# Patient Record
Sex: Female | Born: 1970 | Race: Asian | Hispanic: No | Smoking: Never smoker
Health system: Southern US, Community
[De-identification: ages and names within clinical notes are randomized; demographics above are authoritative.]

## PROBLEM LIST (undated history)

## (undated) DIAGNOSIS — F411 Generalized anxiety disorder: Secondary | ICD-10-CM

## (undated) DIAGNOSIS — M1990 Unspecified osteoarthritis, unspecified site: Secondary | ICD-10-CM

## (undated) DIAGNOSIS — R5383 Other fatigue: Secondary | ICD-10-CM

## (undated) DIAGNOSIS — G4700 Insomnia, unspecified: Secondary | ICD-10-CM

## (undated) DIAGNOSIS — R1032 Left lower quadrant pain: Secondary | ICD-10-CM

## (undated) DIAGNOSIS — D649 Anemia, unspecified: Secondary | ICD-10-CM

## (undated) DIAGNOSIS — T7840XA Allergy, unspecified, initial encounter: Secondary | ICD-10-CM

## (undated) DIAGNOSIS — E079 Disorder of thyroid, unspecified: Secondary | ICD-10-CM

## (undated) DIAGNOSIS — K219 Gastro-esophageal reflux disease without esophagitis: Secondary | ICD-10-CM

## (undated) DIAGNOSIS — Z9889 Other specified postprocedural states: Secondary | ICD-10-CM

## (undated) DIAGNOSIS — R109 Unspecified abdominal pain: Secondary | ICD-10-CM

## (undated) DIAGNOSIS — R05 Cough: Secondary | ICD-10-CM

## (undated) DIAGNOSIS — J019 Acute sinusitis, unspecified: Secondary | ICD-10-CM

## (undated) DIAGNOSIS — R5381 Other malaise: Secondary | ICD-10-CM

## (undated) DIAGNOSIS — R51 Headache: Secondary | ICD-10-CM

## (undated) DIAGNOSIS — R059 Cough, unspecified: Secondary | ICD-10-CM

## (undated) DIAGNOSIS — E785 Hyperlipidemia, unspecified: Secondary | ICD-10-CM

## (undated) DIAGNOSIS — F329 Major depressive disorder, single episode, unspecified: Secondary | ICD-10-CM

## (undated) DIAGNOSIS — J309 Allergic rhinitis, unspecified: Secondary | ICD-10-CM

## (undated) HISTORY — DX: Hyperlipidemia, unspecified: E78.5

## (undated) HISTORY — DX: Allergy, unspecified, initial encounter: T78.40XA

## (undated) HISTORY — DX: Left lower quadrant pain: R10.32

## (undated) HISTORY — DX: Unspecified abdominal pain: R10.9

## (undated) HISTORY — DX: Acute sinusitis, unspecified: J01.90

## (undated) HISTORY — DX: Disorder of thyroid, unspecified: E07.9

## (undated) HISTORY — DX: Other fatigue: R53.83

## (undated) HISTORY — DX: Gastro-esophageal reflux disease without esophagitis: K21.9

## (undated) HISTORY — PX: COLONOSCOPY: SHX174

## (undated) HISTORY — PX: RHINOPLASTY: SUR1284

## (undated) HISTORY — PX: TUBAL LIGATION: SHX77

## (undated) HISTORY — DX: Other specified postprocedural states: Z98.890

## (undated) HISTORY — DX: Major depressive disorder, single episode, unspecified: F32.9

## (undated) HISTORY — PX: WISDOM TOOTH EXTRACTION: SHX21

## (undated) HISTORY — DX: Other malaise: R53.81

## (undated) HISTORY — DX: Unspecified osteoarthritis, unspecified site: M19.90

## (undated) HISTORY — DX: Generalized anxiety disorder: F41.1

## (undated) HISTORY — PX: POLYPECTOMY: SHX149

## (undated) HISTORY — DX: Allergic rhinitis, unspecified: J30.9

---

## 2000-07-02 ENCOUNTER — Other Ambulatory Visit: Admission: RE | Admit: 2000-07-02 | Discharge: 2000-07-02 | Payer: Self-pay | Admitting: Obstetrics and Gynecology

## 2002-08-17 HISTORY — PX: ABDOMINAL HYSTERECTOMY: SHX81

## 2004-09-13 ENCOUNTER — Emergency Department (HOSPITAL_COMMUNITY): Admission: EM | Admit: 2004-09-13 | Discharge: 2004-09-13 | Payer: Self-pay | Admitting: Emergency Medicine

## 2004-09-15 ENCOUNTER — Ambulatory Visit: Payer: Self-pay | Admitting: Internal Medicine

## 2004-09-16 ENCOUNTER — Ambulatory Visit (HOSPITAL_COMMUNITY): Admission: RE | Admit: 2004-09-16 | Discharge: 2004-09-16 | Payer: Self-pay | Admitting: Internal Medicine

## 2004-09-17 ENCOUNTER — Ambulatory Visit (HOSPITAL_COMMUNITY): Admission: RE | Admit: 2004-09-17 | Discharge: 2004-09-17 | Payer: Self-pay | Admitting: Orthopedic Surgery

## 2004-09-25 ENCOUNTER — Ambulatory Visit: Payer: Self-pay | Admitting: Internal Medicine

## 2004-11-27 ENCOUNTER — Ambulatory Visit: Payer: Self-pay | Admitting: Internal Medicine

## 2005-06-02 ENCOUNTER — Other Ambulatory Visit: Admission: RE | Admit: 2005-06-02 | Discharge: 2005-06-02 | Payer: Self-pay | Admitting: Obstetrics and Gynecology

## 2005-09-30 ENCOUNTER — Ambulatory Visit: Payer: Self-pay | Admitting: Internal Medicine

## 2005-11-25 ENCOUNTER — Ambulatory Visit: Payer: Self-pay | Admitting: Internal Medicine

## 2006-04-20 ENCOUNTER — Ambulatory Visit: Payer: Self-pay | Admitting: Internal Medicine

## 2006-07-22 ENCOUNTER — Ambulatory Visit: Payer: Self-pay | Admitting: Internal Medicine

## 2006-10-25 ENCOUNTER — Ambulatory Visit: Payer: Self-pay | Admitting: Internal Medicine

## 2007-07-20 ENCOUNTER — Telehealth (INDEPENDENT_AMBULATORY_CARE_PROVIDER_SITE_OTHER): Payer: Self-pay | Admitting: *Deleted

## 2007-08-04 ENCOUNTER — Ambulatory Visit: Payer: Self-pay | Admitting: Internal Medicine

## 2007-08-04 DIAGNOSIS — R5383 Other fatigue: Secondary | ICD-10-CM

## 2007-08-04 DIAGNOSIS — F329 Major depressive disorder, single episode, unspecified: Secondary | ICD-10-CM

## 2007-08-04 DIAGNOSIS — R109 Unspecified abdominal pain: Secondary | ICD-10-CM | POA: Insufficient documentation

## 2007-08-04 DIAGNOSIS — J309 Allergic rhinitis, unspecified: Secondary | ICD-10-CM

## 2007-08-04 DIAGNOSIS — K219 Gastro-esophageal reflux disease without esophagitis: Secondary | ICD-10-CM

## 2007-08-04 DIAGNOSIS — F411 Generalized anxiety disorder: Secondary | ICD-10-CM

## 2007-08-04 DIAGNOSIS — F3289 Other specified depressive episodes: Secondary | ICD-10-CM

## 2007-08-04 DIAGNOSIS — R5381 Other malaise: Secondary | ICD-10-CM

## 2007-08-04 DIAGNOSIS — N760 Acute vaginitis: Secondary | ICD-10-CM | POA: Insufficient documentation

## 2007-08-04 DIAGNOSIS — N390 Urinary tract infection, site not specified: Secondary | ICD-10-CM

## 2007-08-04 HISTORY — DX: Gastro-esophageal reflux disease without esophagitis: K21.9

## 2007-08-04 HISTORY — DX: Allergic rhinitis, unspecified: J30.9

## 2007-08-04 HISTORY — DX: Major depressive disorder, single episode, unspecified: F32.9

## 2007-08-04 HISTORY — DX: Other specified depressive episodes: F32.89

## 2007-08-04 HISTORY — DX: Unspecified abdominal pain: R10.9

## 2007-08-04 HISTORY — DX: Other malaise: R53.81

## 2007-08-04 HISTORY — DX: Generalized anxiety disorder: F41.1

## 2007-08-04 LAB — CONVERTED CEMR LAB
AST: 16 units/L (ref 0–37)
Bilirubin Urine: NEGATIVE
Bilirubin, Direct: 0.1 mg/dL (ref 0.0–0.3)
Eosinophils Absolute: 0.1 10*3/uL (ref 0.0–0.6)
Eosinophils Relative: 0.8 % (ref 0.0–5.0)
GFR calc Af Amer: 144 mL/min
GFR calc non Af Amer: 119 mL/min
Glucose, Bld: 96 mg/dL (ref 70–99)
HCT: 37.4 % (ref 36.0–46.0)
Ketones, urine, test strip: NEGATIVE
Lymphocytes Relative: 30.9 % (ref 12.0–46.0)
MCV: 90.2 fL (ref 78.0–100.0)
Monocytes Absolute: 0.5 10*3/uL (ref 0.2–0.7)
Neutro Abs: 4.3 10*3/uL (ref 1.4–7.7)
Neutrophils Relative %: 60.9 % (ref 43.0–77.0)
Nitrite: POSITIVE
Potassium: 4 meq/L (ref 3.5–5.1)
Protein, U semiquant: NEGATIVE
Sodium: 139 meq/L (ref 135–145)
TSH: 1.38 microintl units/mL (ref 0.35–5.50)
Urobilinogen, UA: NEGATIVE
WBC: 7.1 10*3/uL (ref 4.5–10.5)

## 2007-11-10 ENCOUNTER — Ambulatory Visit: Payer: Self-pay | Admitting: Internal Medicine

## 2007-11-10 DIAGNOSIS — R300 Dysuria: Secondary | ICD-10-CM

## 2008-03-26 ENCOUNTER — Ambulatory Visit: Payer: Self-pay | Admitting: Internal Medicine

## 2008-03-26 ENCOUNTER — Telehealth (INDEPENDENT_AMBULATORY_CARE_PROVIDER_SITE_OTHER): Payer: Self-pay | Admitting: *Deleted

## 2008-03-26 DIAGNOSIS — R1032 Left lower quadrant pain: Secondary | ICD-10-CM | POA: Insufficient documentation

## 2008-03-26 DIAGNOSIS — J069 Acute upper respiratory infection, unspecified: Secondary | ICD-10-CM | POA: Insufficient documentation

## 2008-03-26 HISTORY — DX: Left lower quadrant pain: R10.32

## 2008-04-11 ENCOUNTER — Encounter (INDEPENDENT_AMBULATORY_CARE_PROVIDER_SITE_OTHER): Payer: Self-pay | Admitting: Obstetrics and Gynecology

## 2008-04-11 ENCOUNTER — Inpatient Hospital Stay (HOSPITAL_COMMUNITY): Admission: RE | Admit: 2008-04-11 | Discharge: 2008-04-13 | Payer: Self-pay | Admitting: Obstetrics and Gynecology

## 2008-05-23 ENCOUNTER — Ambulatory Visit: Payer: Self-pay | Admitting: Internal Medicine

## 2008-08-03 ENCOUNTER — Telehealth (INDEPENDENT_AMBULATORY_CARE_PROVIDER_SITE_OTHER): Payer: Self-pay | Admitting: *Deleted

## 2010-07-25 ENCOUNTER — Ambulatory Visit: Payer: Self-pay | Admitting: Internal Medicine

## 2010-07-25 DIAGNOSIS — J019 Acute sinusitis, unspecified: Secondary | ICD-10-CM

## 2010-07-25 DIAGNOSIS — H6980 Other specified disorders of Eustachian tube, unspecified ear: Secondary | ICD-10-CM | POA: Insufficient documentation

## 2010-07-25 HISTORY — DX: Acute sinusitis, unspecified: J01.90

## 2010-09-16 NOTE — Assessment & Plan Note (Signed)
Summary: ?sinus inf/cd   Vital Signs:  Patient profile:   40 year old female Height:      67 inches Weight:      153.44 pounds BMI:     24.12 O2 Sat:      97 % on Room air Temp:     98.4 degrees F 97oral Pulse rate:   99 / minute BP sitting:   100 / 64  (left arm) Cuff size:   regular  Vitals Entered By: Zella Ball Ewing CMA Duncan Dull) (July 25, 2010 12:53 PM)  O2 Flow:  Room air CC: Congestion, fatigue, dizzy/RE   CC:  Congestion, fatigue, and dizzy/RE.  History of Present Illness: here with c/o acute onset midl to mod 3 days facial pain, pressure, fever and grenish d/c, assoc with marked fatigue , mild ear pressure bilat and dizziness.  Some small ST, but Pt denies CP, worsening sob, doe, wheezing, orthopnea, pnd, worsening LE edema, palps, dizziness or syncope Pt denies new neuro symptoms such as headache, facial or extremity weakness .  Eyes have been mild watery as well for several weeks prior to onset more acute symptoms with some nasal clearish congestion as well, no pain or fever.  No vertigo, hearing loss, n/v.  No overt bleeding or bruising, has regular usual menses, no OSA symtpoms and Denies worsening depressive symptoms, suicidal ideation, or panic.    Preventive Screening-Counseling & Management      Drug Use:  no.    Problems Prior to Update: 1)  Sinusitis- Acute-nos  (ICD-461.9) 2)  Uri  (ICD-465.9) 3)  Abdominal Pain, Left Lower Quadrant  (ICD-789.04) 4)  Dysuria  (ICD-788.1) 5)  Depression  (ICD-311) 6)  Anxiety  (ICD-300.00) 7)  Gerd  (ICD-530.81) 8)  Allergic Rhinitis  (ICD-477.9) 9)  Vaginitis  (ICD-616.10) 10)  Fatigue  (ICD-780.79) 11)  Abdominal Pain, Suprapubic  (ICD-789.09) 12)  Uti  (ICD-599.0)  Medications Prior to Update: 1)  Hydrocodone-Acetaminophen 5-325 Mg  Tabs (Hydrocodone-Acetaminophen) .Marland Kitchen.. 1 - 2 By Mouth Q 6 Hrs As Needed Pain 2)  Cephalexin 500 Mg  Tabs (Cephalexin) .Marland Kitchen.. 1 By Mouth Three Times A Day 3)  Fluconazole 150 Mg  Tabs  (Fluconazole) .Marland Kitchen.. 1 By Mouth Q 3 Days As Needed 4)  Tussionex Pennkinetic Er 8-10 Mg/53ml  Lqcr (Chlorpheniramine-Hydrocodone) .Marland Kitchen.. 1 Tsp By Mouth Two Times A Day As Needed  Current Medications (verified): 1)  Levofloxacin 500 Mg Tabs (Levofloxacin) .Marland Kitchen.. 1po Once Daily 2)  Fluconazole 150 Mg  Tabs (Fluconazole) .Marland Kitchen.. 1 By Mouth Q 3 Days As Needed For Yeast Infection 3)  Hydrocodone-Homatropine 5-1.5 Mg/14ml Syrp (Hydrocodone-Homatropine) .Marland Kitchen.. 1 Tsp By Mouth Q 6 Hrs As Needed Cough 4)  Sudahist 12-120 Mg Xr12h-Tab (Chlorpheniramine-Pseudoeph) .Marland Kitchen.. 1 By Mouth Two Times A Day As Needed  Allergies (verified): No Known Drug Allergies  Past History:  Past Medical History: Last updated: 08/04/2007 Allergic rhinitis GERD Anxiety Depression  Past Surgical History: Last updated: 08/04/2007 Denies surgical history  Social History: Last updated: 07/25/2010 Never Smoked Alcohol use-no Drug use-no Married  Risk Factors: Smoking Status: never (08/04/2007)  Social History: Never Smoked Alcohol use-no Drug use-no Married Drug Use:  no  Review of Systems       all otherwise negative per pt -    Physical Exam  General:  alert and well-developed.  , mild ill  Head:  normocephalic and atraumatic.   Eyes:  vision grossly intact, pupils equal, and pupils round.   Ears:  R ear normal. left  TM mild erythema, nonbulging and canals area clear, sinus tender bilat Nose:  nasal dischargemucosal pallor and mucosal edema.   Mouth:  pharyngeal erythema and fair dentition.   Neck:  supple and no masses.   Lungs:  normal respiratory effort and normal breath sounds.   Heart:  normal rate and regular rhythm.   Abdomen:  mod LLQ tender Msk:  no joint tenderness and no joint swelling.   Extremities:  no edema, no ulcers  Neurologic:  strength normal in all extremities and gait normal.     Impression & Recommendations:  Problem # 1:  SINUSITIS- ACUTE-NOS (ICD-461.9) Assessment  Deteriorated  Her updated medication list for this problem includes:    Levofloxacin 500 Mg Tabs (Levofloxacin) .Marland Kitchen... 1po once daily    Hydrocodone-homatropine 5-1.5 Mg/30ml Syrp (Hydrocodone-homatropine) .Marland Kitchen... 1 tsp by mouth q 6 hrs as needed cough    Sudahist 12-120 Mg Xr12h-tab (Chlorpheniramine-pseudoeph) .Marland Kitchen... 1 by mouth two times a day as needed treat as above, f/u any worsening signs or symptoms   Problem # 2:  FATIGUE (ICD-780.79) Assessment: Deteriorated exam o/w benign - exam benign, to check labs next visit,  follow with expectant management   Problem # 3:  ALLERGIC RHINITIS (ICD-477.9) Assessment: Deteriorated Ok for OTC allegra as needed   Problem # 4:  OTHER DISORDERS OF EUSTACHIAN TUBE (ICD-381.89) Assessment: Deteriorated likely to account for the dizziness - for mucinex otc as needed   Complete Medication List: 1)  Levofloxacin 500 Mg Tabs (Levofloxacin) .Marland Kitchen.. 1po once daily 2)  Fluconazole 150 Mg Tabs (Fluconazole) .Marland Kitchen.. 1 by mouth q 3 days as needed for yeast infection 3)  Hydrocodone-homatropine 5-1.5 Mg/57ml Syrp (Hydrocodone-homatropine) .Marland Kitchen.. 1 tsp by mouth q 6 hrs as needed cough 4)  Sudahist 12-120 Mg Xr12h-tab (Chlorpheniramine-pseudoeph) .Marland Kitchen.. 1 by mouth two times a day as needed  Patient Instructions: 1)  Please take all new medications as prescribed 2)  Continue all previous medications as before this visit  3)  Please schedule a follow-up appointment in 1 month for CPX with labs Prescriptions: SUDAHIST 12-120 MG XR12H-TAB (CHLORPHENIRAMINE-PSEUDOEPH) 1 by mouth two times a day as needed  #30 x 0   Entered and Authorized by:   Corwin Levins MD   Signed by:   Corwin Levins MD on 07/25/2010   Method used:   Print then Give to Patient   RxID:   [id number] LEVOFLOXACIN 500 MG TABS (LEVOFLOXACIN) 1po once daily  #10 x 0   Entered and Authorized by:   Corwin Levins MD   Signed by:   Corwin Levins MD on 07/25/2010   Method used:   Print then Give to Patient    RxID:   [id number] FLUCONAZOLE 150 MG  TABS (FLUCONAZOLE) 1 by mouth q 3 days as needed for yeast infection  #2 x 1   Entered and Authorized by:   Corwin Levins MD   Signed by:   Corwin Levins MD on 07/25/2010   Method used:   Print then Give to Patient   RxID:   [id number] HYDROCODONE-HOMATROPINE 5-1.5 MG/5ML SYRP (HYDROCODONE-HOMATROPINE) 1 tsp by mouth q 6 hrs as needed cough  #6oz x 1   Entered and Authorized by:   Corwin Levins MD   Signed by:   Corwin Levins MD on 07/25/2010   Method used:   Print then Give to Patient   RxID:   [id number]    Orders Added: 1)  Est. Patient Level IV [82993]

## 2010-11-13 ENCOUNTER — Other Ambulatory Visit: Payer: Self-pay

## 2010-11-13 ENCOUNTER — Other Ambulatory Visit: Payer: Self-pay | Admitting: Internal Medicine

## 2010-11-13 DIAGNOSIS — Z0000 Encounter for general adult medical examination without abnormal findings: Secondary | ICD-10-CM

## 2010-11-18 ENCOUNTER — Other Ambulatory Visit: Payer: Self-pay | Admitting: Obstetrics and Gynecology

## 2010-11-18 DIAGNOSIS — R928 Other abnormal and inconclusive findings on diagnostic imaging of breast: Secondary | ICD-10-CM

## 2010-11-19 ENCOUNTER — Other Ambulatory Visit (INDEPENDENT_AMBULATORY_CARE_PROVIDER_SITE_OTHER): Payer: Managed Care, Other (non HMO)

## 2010-11-19 DIAGNOSIS — Z0000 Encounter for general adult medical examination without abnormal findings: Secondary | ICD-10-CM

## 2010-11-19 LAB — LIPID PANEL
LDL Cholesterol: 129 mg/dL — ABNORMAL HIGH (ref 0–99)
Triglycerides: 32 mg/dL (ref 0.0–149.0)
VLDL: 6.4 mg/dL (ref 0.0–40.0)

## 2010-11-19 LAB — BASIC METABOLIC PANEL
CO2: 29 mEq/L (ref 19–32)
Chloride: 103 mEq/L (ref 96–112)
Creatinine, Ser: 0.5 mg/dL (ref 0.4–1.2)
Sodium: 139 mEq/L (ref 135–145)

## 2010-11-19 LAB — CBC WITH DIFFERENTIAL/PLATELET
Basophils Absolute: 0 10*3/uL (ref 0.0–0.1)
Hemoglobin: 12.8 g/dL (ref 12.0–15.0)
Lymphocytes Relative: 28.5 % (ref 12.0–46.0)
Lymphs Abs: 1.5 10*3/uL (ref 0.7–4.0)
MCHC: 34.8 g/dL (ref 30.0–36.0)
Monocytes Relative: 5.5 % (ref 3.0–12.0)
Neutro Abs: 3.4 10*3/uL (ref 1.4–7.7)
Neutrophils Relative %: 64.7 % (ref 43.0–77.0)
RDW: 13.1 % (ref 11.5–14.6)
WBC: 5.3 10*3/uL (ref 4.5–10.5)

## 2010-11-19 LAB — HEPATIC FUNCTION PANEL
ALT: 15 U/L (ref 0–35)
AST: 24 U/L (ref 0–37)
Alkaline Phosphatase: 43 U/L (ref 39–117)
Bilirubin, Direct: 0.1 mg/dL (ref 0.0–0.3)
Total Bilirubin: 1 mg/dL (ref 0.3–1.2)
Total Protein: 7.1 g/dL (ref 6.0–8.3)

## 2010-11-19 LAB — URINALYSIS
Bilirubin Urine: NEGATIVE
Ketones, ur: NEGATIVE
Urine Glucose: NEGATIVE
Urobilinogen, UA: 0.2 (ref 0.0–1.0)
pH: 6 (ref 5.0–8.0)

## 2010-11-20 ENCOUNTER — Encounter: Payer: Self-pay | Admitting: Internal Medicine

## 2010-11-20 ENCOUNTER — Ambulatory Visit
Admission: RE | Admit: 2010-11-20 | Discharge: 2010-11-20 | Disposition: A | Discharge status: Home / Self Care | Payer: Managed Care, Other (non HMO) | Source: Ambulatory Visit | Attending: Obstetrics and Gynecology | Admitting: Obstetrics and Gynecology

## 2010-11-20 ENCOUNTER — Other Ambulatory Visit: Payer: Self-pay | Admitting: Diagnostic Radiology

## 2010-11-20 ENCOUNTER — Other Ambulatory Visit: Payer: Self-pay | Admitting: Obstetrics and Gynecology

## 2010-11-20 ENCOUNTER — Ambulatory Visit (INDEPENDENT_AMBULATORY_CARE_PROVIDER_SITE_OTHER)
Admission: RE | Admit: 2010-11-20 | Discharge: 2010-11-20 | Disposition: A | Discharge status: Home / Self Care | Payer: Managed Care, Other (non HMO) | Source: Ambulatory Visit | Attending: Internal Medicine | Admitting: Internal Medicine

## 2010-11-20 ENCOUNTER — Ambulatory Visit (INDEPENDENT_AMBULATORY_CARE_PROVIDER_SITE_OTHER): Payer: Managed Care, Other (non HMO) | Admitting: Internal Medicine

## 2010-11-20 VITALS — BP 92/60 | HR 76 | Temp 97.4°F | Ht 67.0 in | Wt 155.2 lb

## 2010-11-20 DIAGNOSIS — R928 Other abnormal and inconclusive findings on diagnostic imaging of breast: Secondary | ICD-10-CM

## 2010-11-20 DIAGNOSIS — Z0000 Encounter for general adult medical examination without abnormal findings: Secondary | ICD-10-CM | POA: Insufficient documentation

## 2010-11-20 DIAGNOSIS — F329 Major depressive disorder, single episode, unspecified: Secondary | ICD-10-CM

## 2010-11-20 DIAGNOSIS — R079 Chest pain, unspecified: Secondary | ICD-10-CM | POA: Insufficient documentation

## 2010-11-20 DIAGNOSIS — J309 Allergic rhinitis, unspecified: Secondary | ICD-10-CM

## 2010-11-20 DIAGNOSIS — K219 Gastro-esophageal reflux disease without esophagitis: Secondary | ICD-10-CM

## 2010-11-20 DIAGNOSIS — G4700 Insomnia, unspecified: Secondary | ICD-10-CM

## 2010-11-20 DIAGNOSIS — Z0001 Encounter for general adult medical examination with abnormal findings: Secondary | ICD-10-CM | POA: Insufficient documentation

## 2010-11-20 MED ORDER — ZOLPIDEM TARTRATE 10 MG PO TABS: 10.0000 mg | ORAL_TABLET | Freq: Every evening | ORAL | Status: DC | PRN

## 2010-11-20 MED ORDER — FLUTICASONE PROPIONATE 50 MCG/ACT NA SUSP: 2.0000 | Freq: Every day | NASAL | Status: DC

## 2010-11-20 MED ORDER — ESCITALOPRAM OXALATE 10 MG PO TABS: 10.0000 mg | ORAL_TABLET | Freq: Every day | ORAL | Status: DC

## 2010-11-20 MED ORDER — OMEPRAZOLE 20 MG PO CPDR: 20.0000 mg | DELAYED_RELEASE_CAPSULE | Freq: Every day | ORAL | Status: DC

## 2010-11-20 NOTE — Progress Notes (Signed)
Subjective:    Patient ID: [name], female    DOB: 1971-01-10, 40 y.o.   MRN: [id number]  HPI  Here for wellness and f/u;  Overall doing ok;  Pt denies worsening SOB, DOE, wheezing, orthopnea, PND, worsening LE edema, palpitations, dizziness or syncope, but has infreqeunt mid and left side CP, sharp, somewhat affects the left arm, somwhat exertional, tries to exercise at the gym lately which has slowed her efforts down, pain better with rest at the gym when occurs, occasionally worse to take deep breaths but not always, and some diaphoresis occasionally as well but is trying to excercies, has no assoc nausea/vomit, palp's, dizziness or syncope. Did have fall about 7 yrs ago to the left side and was told at that time in a few yrs she might have worsening pain to the left arm and shoulder that might require further eval or even surgury.  Pt denies neurological change such as new Headache, facial or extremity weakness.  Pt denies polydipsia, polyuria, or low sugar symptoms. Pt states overall good compliance with treatment and medications, good tolerability, and trying to follow lower cholesterol diet.  Pt denies worsening depressive symptoms, suicidal ideation or panic. No fever, wt loss, night sweats, loss of appetite, or other constitutional symptoms.  Pt states good ability with ADL's, low fall risk, home safety reviewed and adequate, no significant changes in hearing or vision, and occasionally active with exercise.   Has mammogram later today, in f/u for ? Abnormal exam last wk.  Denies worsening depressive symptoms, suicidal ideation, or panic, though has ongoing anxiety, not increased recently.  Has ongoing insomnia as well, worse to try to get to sleep, and stay asleep.  Works 12 hrs days to pay for college savings for child. Also with 3 wks increased nasal allergy symtpoms with congestion and OTC meds not working.  Also with daily reflux without  dysphagia, abd pain, n/v, bowel change or blood.   Past  Medical History  Diagnosis Date  . ANXIETY 08/04/2007  . DEPRESSION 08/04/2007  . Other disorders of Eustachian tube 07/25/2010  . SINUSITIS- ACUTE-NOS 07/25/2010  . URI 03/26/2008  . ALLERGIC RHINITIS 08/04/2007  . GERD 08/04/2007  . UTI 08/04/2007  . VAGINITIS 08/04/2007  . FATIGUE 08/04/2007  . Dysuria 11/10/2007  . Abdominal pain, left lower quadrant 03/26/2008  . ABDOMINAL PAIN, SUPRAPUBIC 08/04/2007   No past surgical history on file.  reports that she has never smoked. She does not have any smokeless tobacco history on file. She reports that she does not drink alcohol or use illicit drugs. family history includes Asthma in her mother. No Known Allergies  Current Outpatient Prescriptions on File Prior to Visit  Medication Sig Dispense Refill  . Chlorpheniramine-Pseudoeph (SUDAHIST) 12-120 MG TB12 Take by mouth 2 (two) times daily.        . fluconazole (DIFLUCAN) 150 MG tablet Take 150 mg by mouth. 1 by mouth every 3 days as needed for yeast infection       . HYDROcodone-homatropine (HYCODAN) 5-1.5 MG/5ML syrup Take by mouth every 6 (six) hours as needed.        Marland Kitchen levofloxacin (LEVAQUIN) 500 MG tablet Take 500 mg by mouth daily.         Review of Systems Review of Systems  Constitutional: Negative for diaphoresis, activity change, appetite change and unexpected weight change.  HENT: Negative for hearing loss, ear pain, facial swelling, mouth sores and neck stiffness.   Eyes: Negative for pain, redness and  visual disturbance.  Respiratory: Negative for shortness of breath and wheezing.   Cardiovascular: Negative for chest pain and palpitations.  Gastrointestinal: Negative for diarrhea, blood in stool, abdominal distention and rectal pain.  Genitourinary: Negative for hematuria, flank pain and decreased urine volume.  Musculoskeletal: Negative for myalgias and joint swelling.  Skin: Negative for color change and wound.  Neurological: Negative for syncope and numbness.    Hematological: Negative for adenopathy.  Psychiatric/Behavioral: Negative for hallucinations, self-injury, decreased concentration and agitation. but has had ongoing stress and depressive symtpoms for several months without suicidal ideation or panic.     Objective:   Physical Exam BP 92/60  Pulse 76  Temp(Src) 97.4 F (36.3 C) (Oral)  Ht 5\' 7"  (1.702 m)  Wt 155 lb 4 oz (70.421 kg)  BMI 24.32 kg/m2  SpO2 98%  Physical Exam  VS noted Constitutional: Pt is oriented to person, place, and time. Appears well-developed and well-nourished.  HENT:  Head: Normocephalic and atraumatic.  Right Ear: External ear normal.  Left Ear: External ear normal.  Nose: Nose normal.  Mouth/Throat: Oropharynx is clear and moist.  Eyes: Conjunctivae and EOM are normal. Pupils are equal, round, and reactive to light.  Neck: Normal range of motion. Neck supple. No JVD present. No tracheal deviation present.  Cardiovascular: Normal rate, regular rhythm, normal heart sounds and intact distal pulses.   Pulmonary/Chest: Effort normal and breath sounds normal.  Abdominal: Soft. Bowel sounds are normal. There is no tenderness.  Musculoskeletal: Normal range of motion. Exhibits no edema.  Lymphadenopathy:  Has no cervical adenopathy.  Neurological: Pt is alert and oriented to person, place, and time. Pt has normal reflexes. No cranial nerve deficit. Motor normal Skin: Skin is warm and dry. No rash noted.  Psychiatric:  Has  dysphoric mood and affect. Behavior is normal.         Assessment & Plan:

## 2010-11-20 NOTE — Assessment & Plan Note (Signed)
Mild to mod, likely stress related, for ambien prn, try to avoid benzos for now

## 2010-11-20 NOTE — Assessment & Plan Note (Signed)
Mild to mod, treatment as below, f/u any worsening symptoms

## 2010-11-20 NOTE — Assessment & Plan Note (Signed)

## 2010-11-20 NOTE — Patient Instructions (Signed)
Take all new medications as prescribed - the sleep med (generic ambien), omeprazole (generic prilosec), generic lexapro (for depression and nerves), and generic flonase for the allergies Your EKG and blood work was ok Please follow lower cholesterol diet Please go to XRAY in the Basement for the x-ray test Please call the number on the [employer] ([facility] [employer]) for results of testing in 2-3 days Please call if you change your mind about having the stress test done Please return in 1 year for your yearly visit, or sooner if needed, with Lab testing done 3-5 days before

## 2010-11-20 NOTE — Assessment & Plan Note (Signed)
Mild to mod ongoing dysphoria - treatment as below, f/u any worsening symptoms

## 2010-11-20 NOTE — Assessment & Plan Note (Signed)
Mild to mod, treatment as below, f/u any worsening symptoms  

## 2010-11-20 NOTE — Assessment & Plan Note (Signed)
Atypical, ECG reviewed - no acute changes, also for CXR;  I strongly advised stress test and holding off on gym exercise but she declines at this time

## 2010-11-27 ENCOUNTER — Encounter: Payer: Self-pay | Admitting: Internal Medicine

## 2010-12-30 NOTE — H&P (Signed)
[name], [name]                    ACCOUNT NO.:  [id number]   MEDICAL RECORD NO.:  [id number]         PATIENT TYPE:  WAMB   LOCATION:                                FACILITY:  [facility]   PHYSICIAN:  Duke Salvia. Marcelle Overlie, M.D.DATE OF BIRTH:  1971-05-26   DATE OF ADMISSION:  04/11/2008  DATE OF DISCHARGE:                              HISTORY & PHYSICAL   CHIEF COMPLAINT:  Symptomatic leiomyoma.   HISTORY OF PRESENT ILLNESS:  A 40 year old G6, P4 who has had a prior  tubal.  When seen in May 2009 was complaining of pelvic and lower back  discomfort and feeling a mass in her lower abdomen.  Exam at that time  revealed the uterus to be 12-week size.  Pelvic ultrasound was done in  our office on 01/27/2008 that showed 2 large fibroids 8.2 x 7.4 and 4.1  cm adnexa unremarkable.  Due to continued problems with dysmenorrhea,  pelvic, and lower back pain, she presents now for definitive  hysterectomy.  Procedure of TAH discussed with her including risks  related to bleeding, infection, transfusion, adjacent organ injury along  with expected recovery time, which she understands and accepts.   PAST MEDICAL HISTORY/ALLERGIES:  None.   OPERATIONS:  Tubal ligation.   She has 4 living children.   No current medications.   Last Pap in May 2009 was normal.   FAMILY HISTORY:  Otherwise unremarkable.   PHYSICAL EXAM:  VITAL SIGNS:  Temperature 98.2 and blood pressure  120/78.  HEENT:  Unremarkable.  NECK:  Supple without masses.  LUNGS:  Clear.  CARDIOVASCULAR:  Regular rate and rhythm without murmurs, rubs, or  gallops.  BREASTS:  Without masses.  ABDOMEN:  Soft, flat, nontender.  GU:  Fibroids are palpable above the symphysis.  Vulva, vagina, and  cervix normal.  Uterus 12 to 14-week size, irregular.  Adnexa negative.  EXTREMITIES AND NEUROLOGIC:  Unremarkable.   IMPRESSION:  Symptomatic leiomyoma.   PLAN:  TAH procedure and risks reviewed as above.      Richard M. Marcelle Overlie, M.D.  Electronically Signed     RMH/MEDQ  D:  04/03/2008  T:  04/04/2008  Job:  [id number]

## 2010-12-30 NOTE — Op Note (Signed)
[name], Underwood                    ACCOUNT NO.:  [id number]   MEDICAL RECORD NO.:  [id number]          PATIENT TYPE:  AMB   LOCATION:  [facility]                           FACILITY:  [facility]   PHYSICIAN:  Duke Salvia. Marcelle Overlie, M.D.DATE OF BIRTH:  01/23/1971   DATE OF PROCEDURE:  04/11/2008  DATE OF DISCHARGE:                               OPERATIVE REPORT   PREOPERATIVE DIAGNOSIS:  Symptomatic leiomyoma.   POSTOPERATIVE DIAGNOSIS:  Symptomatic leiomyoma.   PROCEDURE:  TAH.   SURGEON:  Duke Salvia. Marcelle Overlie, MD   ASSISTANT:  Ottie Glazier   ANESTHESIA:  General endotracheal.   COMPLICATIONS:  None.   DRAINS:  Foley catheter.   URINE OUTPUT:  300 mL.   EBL:  300 mL.   COMPLICATIONS:  None.   SPECIMENS REMOVED:  Uterus and cervix.   PROCEDURE AND FINDINGS:  The patient was taken to the operating room.  After an adequate level of general endotracheal anesthesia was obtained,  the patient was laid supine.  The abdomen was prepped and drape in usual  manner for sterile abdominal procedure.  Vagina was prepped separately  with Betadine.  Foley catheter was positioned draining clear urine.  Transverse incision made 2 fingerbreadths above the symphysis after  normal prepping and draping.  This was carried down to the fascia which  was incised and extended transversely.  Rectus muscles divided in the  midline.  Peritoneum entered superiorly without incident and extended  vertically.  O'Connor-O'Sullivan retractor was positioned.  Green drape  pads were then used to elevate the retractor to make sure there was no  sidewall pressure.  She was placed in Trendelenburg.  Bowels packed  superiorly out of the field.  The uterus itself was 15 weeks size with  several large fibroids, adnexa unremarkable.  The single-tooth tenaculum  was used to grasp the large fibroid and elevate the uterus out of the  pelvis.  Long Kelly clamps were then placed across each utero-ovarian  pedicle.  Starting on the  right, the handheld LigaSure was then used to  coagulate and divide the round ligament.  The peritoneum was carried  around the midline anteriorly.  The posterior ligament and broad-  ligament was then perforated with the surgeon's finger and the right  utero-ovarian pedicle was isolated, clamped, divided first free tied  followed by suture ligature 0 Vicryl.  The exact same repeated on the  opposite side, thus both ovaries were conserved.   The uterine vascular pedicle on either side was then skeletonized,  bladder was dissected below the cervicovaginal margin with sharp and  blunt dissection.  Then in sequential manner, the ascending branch of  the uterine artery, cardinal ligament, and uterine and uterosacral  ligaments were coagulated and divided.  Curved Heaney clamps was then  used to take the final pedicle at the cervicovaginal junction.  This was  sutured in Heaney fashion with 0 Vicryl suture and the specimen was  excised.  Cuff was closed with interrupted 2-0 Monocryl sutures.  The  pelvis was irrigated with saline, aspirated noted be hemostatic.  The  ovaries were suspended to the round ligament on each side with a single  stay suture for elevation.  Prior to closure sponge, needle, and  instrument counts reported as correct x2.  Peritoneum closed with  running 2-0 Vicryl suture.  Rectus muscles reapproximated in the midline  with 2-0 Vicryl interrupted sutures.  Fascia closed from laterally to  midline on either side with 0 PDS suture.  Subcutaneous tissue was  hemostatic.  On-Q subfascial and subcutaneous catheter were positioned.  Clips were used on the skin.  She tolerated this well, went to recovery  room in good condition.  Clear urine noted at the end of the case.      Richard M. Marcelle Overlie, M.D.  Electronically Signed     RMH/MEDQ  D:  04/11/2008  T:  04/11/2008  Job:  [id number]

## 2010-12-30 NOTE — Discharge Summary (Signed)
NAMECRESSIE, BETZLER                    ACCOUNT NO.:  [id number]   MEDICAL RECORD NO.:  [id number]          PATIENT TYPE:  INP   LOCATION:  9319                          FACILITY:  [facility]   PHYSICIAN:  Duke Salvia. Marcelle Overlie, M.D.DATE OF BIRTH:  08-25-1970   DATE OF ADMISSION:  04/11/2008  DATE OF DISCHARGE:  04/13/2008                               DISCHARGE SUMMARY   DISCHARGE DIAGNOSES:  1. Symptomatic leiomyoma.  2. Total abdominal hysterectomy this admission.   Summary of the history and physical exam, please see admission H and P  for details.  Briefly, a 40 year old with symptomatic fibroids, presents  for definitive hysterectomy.   HOSPITAL COURSE:  On April 11, 2008, under general anesthesia, the  patient underwent TAH, both ovaries were conserved.  The uterus was  approximately 15-week size, approximately 750 g.  On first postop day,  her hemoglobin was 10.2, catheter was removed.  Her diet was advanced.  By the a.m. of April 13, 2008, she remained afebrile.  She was  ambulating without difficulty.  Voiding well.  Incision was clean and  dry with a normal abdominal exam, and she was ready for discharge at  that point.   LABORATORY DATA:  CBC on April 10, 2008, WBC 5.4, hemoglobin 13.2,  hematocrit 39.1, platelets 260,000.  EPT negative.  Antibody screen was  negative.  Blood type is B positive.  Postop CBC on April 12, 2008, WBC  7.1, hemoglobin 10.2, and hematocrit 30.1.   DISPOSITION:  The patient is discharged on Tylox p.r.n. pain.  Will  return to the office in 3-4 days to have her clips removed.  Advised to  report any incisional redness or drainage, increased pain or bleeding,  or fever over 101.  She was given specific instructions regarding diet,  sex, and exercise.   CONDITION:  Good.   ACTIVITY:  Graded increase.      Richard M. Marcelle Overlie, M.D.  Electronically Signed     RMH/MEDQ  D:  04/13/2008  T:  04/13/2008  Job:  [id number]

## 2011-03-20 ENCOUNTER — Ambulatory Visit (INDEPENDENT_AMBULATORY_CARE_PROVIDER_SITE_OTHER): Payer: Managed Care, Other (non HMO) | Admitting: Internal Medicine

## 2011-03-20 ENCOUNTER — Encounter: Payer: Self-pay | Admitting: Internal Medicine

## 2011-03-20 VITALS — BP 120/80 | HR 68 | Temp 98.6°F | Ht 67.0 in | Wt 162.0 lb

## 2011-03-20 DIAGNOSIS — R5381 Other malaise: Secondary | ICD-10-CM

## 2011-03-20 DIAGNOSIS — F411 Generalized anxiety disorder: Secondary | ICD-10-CM

## 2011-03-20 DIAGNOSIS — J209 Acute bronchitis, unspecified: Secondary | ICD-10-CM

## 2011-03-20 DIAGNOSIS — H6980 Other specified disorders of Eustachian tube, unspecified ear: Secondary | ICD-10-CM

## 2011-03-20 DIAGNOSIS — R5383 Other fatigue: Secondary | ICD-10-CM | POA: Insufficient documentation

## 2011-03-20 MED ORDER — HYDROCODONE-HOMATROPINE 5-1.5 MG/5ML PO SYRP: 5.0000 mL | ORAL_SOLUTION | Freq: Four times a day (QID) | ORAL | Status: DC | PRN

## 2011-03-20 MED ORDER — AZITHROMYCIN 250 MG PO TABS: ORAL_TABLET | ORAL | Status: AC

## 2011-03-20 NOTE — Assessment & Plan Note (Signed)
Also for mucinex otc prn,  to f/u any worsening symptoms or concerns  

## 2011-03-20 NOTE — Progress Notes (Signed)
Subjective:    Patient ID: [name], female    DOB: 06/20/1971, 40 y.o.   MRN: [id number]  HPI Here with acute onset mild to mod 2-3 days ST, HA, general weakness and malaise, with prod cough greenish sputum, but Pt denies chest pain, increased sob or doe, wheezing, orthopnea, PND, increased LE swelling, palpitations, or syncope, with mild dizziness, and ear popping and crackling.  Does have sense of ongoing fatigue, but denies signficant hypersomnolence.  Pt denies new neurological symptoms such as new headache, or facial or extremity weakness or numbness   Pt denies polydipsia, polyuria. Denies worsening depressive symptoms, suicidal ideation, or panic, though has ongoing anxiety, not increased recently.   Past Medical History  Diagnosis Date  . ANXIETY 08/04/2007  . DEPRESSION 08/04/2007  . Other disorders of Eustachian tube 07/25/2010  . SINUSITIS- ACUTE-NOS 07/25/2010  . URI 03/26/2008  . ALLERGIC RHINITIS 08/04/2007  . GERD 08/04/2007  . UTI 08/04/2007  . VAGINITIS 08/04/2007  . FATIGUE 08/04/2007  . Dysuria 11/10/2007  . Abdominal pain, left lower quadrant 03/26/2008  . ABDOMINAL PAIN, SUPRAPUBIC 08/04/2007   No past surgical history on file.  reports that she has never smoked. She does not have any smokeless tobacco history on file. She reports that she does not drink alcohol or use illicit drugs. family history includes Asthma in her mother. No Known Allergies Current Outpatient Prescriptions on File Prior to Visit  Medication Sig Dispense Refill  . escitalopram (LEXAPRO) 10 MG tablet Take 1 tablet (10 mg total) by mouth daily.  30 tablet  11  . fluconazole (DIFLUCAN) 150 MG tablet Take 150 mg by mouth. 1 by mouth every 3 days as needed for yeast infection       . fluticasone (FLONASE) 50 MCG/ACT nasal spray 2 sprays by Nasal route daily.  16 g  2  . omeprazole (PRILOSEC) 20 MG capsule Take 1 capsule (20 mg total) by mouth daily.  30 capsule  11  . Chlorpheniramine-Pseudoeph  (SUDAHIST) 12-120 MG TB12 Take by mouth 2 (two) times daily.        Marland Kitchen HYDROcodone-homatropine (HYCODAN) 5-1.5 MG/5ML syrup Take by mouth every 6 (six) hours as needed.         .    Review of Systems Review of Systems  Constitutional: Negative for diaphoresis and unexpected weight change.  HENT: Negative for drooling and tinnitus.   Eyes: Negative for photophobia and visual disturbance.  Respiratory: Negative for choking and stridor.   Gastrointestinal: Negative for vomiting and blood in stool.  Genitourinary: Negative for hematuria and decreased urine volume.       Objective:   Physical Exam BP 120/80  Pulse 68  Temp(Src) 98.6 F (37 C) (Oral)  Ht 5\' 7"  (1.702 m)  Wt 162 lb (73.483 kg)  BMI 25.37 kg/m2  SpO2 97% Physical Exam  VS noted, mild ill Constitutional: Pt appears well-developed and well-nourished.  HENT: Head: Normocephalic.  Right Ear: External ear normal.  Left Ear: External ear normal.  Bilat tm's mild erythema.  Sinus nontender.  Pharynx mild erythema Eyes: Conjunctivae and EOM are normal. Pupils are equal, round, and reactive to light.  Neck: Normal range of motion. Neck supple.  Cardiovascular: Normal rate and regular rhythm.   Pulmonary/Chest: Effort normal and breath sounds normal.  Neurological: Pt is alert. No cranial nerve deficit.  Skin: Skin is warm. No erythema.  Psychiatric: Pt behavior is normal. Thought content normal. 1+ nervous  Assessment & Plan:

## 2011-03-20 NOTE — Assessment & Plan Note (Signed)
Mild to mod, for antibx course,  to f/u any worsening symptoms or concerns 

## 2011-03-20 NOTE — Assessment & Plan Note (Signed)
Etiology unclear, Exam otherwise benign, most likely secondary to above, follow with expectant management

## 2011-03-20 NOTE — Assessment & Plan Note (Signed)
stable overall by hx and exam, most recent data reviewed with pt, and pt to continue medical treatment as before  Lab Results  Component Value Date   WBC 5.3 11/19/2010   HGB 12.8 11/19/2010   HCT 36.6 11/19/2010   PLT 224.0 11/19/2010   CHOL 183 11/19/2010   TRIG 32.0 11/19/2010   HDL 48.10 11/19/2010   ALT 15 11/19/2010   AST 24 11/19/2010   NA 139 11/19/2010   K 4.0 11/19/2010   CL 103 11/19/2010   CREATININE 0.5 11/19/2010   BUN 8 11/19/2010   CO2 29 11/19/2010   TSH 0.64 11/19/2010

## 2011-03-20 NOTE — Patient Instructions (Addendum)
Take all new medications as prescribed Continue all other medications as before Please return approximately Apr 5 , 2013 with labs done 3-4 days before

## 2011-06-22 ENCOUNTER — Ambulatory Visit (INDEPENDENT_AMBULATORY_CARE_PROVIDER_SITE_OTHER): Payer: Managed Care, Other (non HMO) | Admitting: Internal Medicine

## 2011-06-22 ENCOUNTER — Other Ambulatory Visit (INDEPENDENT_AMBULATORY_CARE_PROVIDER_SITE_OTHER): Payer: Managed Care, Other (non HMO)

## 2011-06-22 VITALS — BP 110/80 | HR 80 | Temp 97.8°F | Wt 163.0 lb

## 2011-06-22 DIAGNOSIS — L299 Pruritus, unspecified: Secondary | ICD-10-CM

## 2011-06-22 LAB — HEPATIC FUNCTION PANEL
Albumin: 4 g/dL (ref 3.5–5.2)
Total Protein: 7.3 g/dL (ref 6.0–8.3)

## 2011-06-22 LAB — CBC WITH DIFFERENTIAL/PLATELET
Basophils Absolute: 0 10*3/uL (ref 0.0–0.1)
Eosinophils Absolute: 0.1 10*3/uL (ref 0.0–0.7)
HCT: 39.6 % (ref 36.0–46.0)
Hemoglobin: 13.3 g/dL (ref 12.0–15.0)
Lymphs Abs: 1.4 10*3/uL (ref 0.7–4.0)
MCHC: 33.5 g/dL (ref 30.0–36.0)
MCV: 91.7 fl (ref 78.0–100.0)
Monocytes Absolute: 0.2 10*3/uL (ref 0.1–1.0)
Neutro Abs: 2.7 10*3/uL (ref 1.4–7.7)
RDW: 13.1 % (ref 11.5–14.6)

## 2011-06-22 NOTE — Patient Instructions (Signed)
Generalized itching - no visible rash and there is no abnormal finding on exam. Will check liver, thyroid and blood counts to look for a potential cause. To relieve the symptoms take loratadine 10 mg twice a day; take ranitidine 150 mg twice a day. Soothing treatments for the skin include neutral moisturizing lotions; tepid bath with colloidal suspension - oatmeal, avino.    Pruritus   Pruritis is an itch. There are many different problems that can cause an itch. Dry skin is one of the most common causes of itching. Most cases of itching do not require medical attention.   HOME CARE INSTRUCTIONS   Make sure your skin is moistened on a regular basis. A moisturizer that contains petroleum jelly is best for keeping moisture in your skin. If you develop a rash, you may try the following for relief:    Use corticosteroid cream.     Apply cool compresses to the affected areas.     Bathe with Epsom salts or baking soda in the bathwater.     Soak in colloidal oatmeal baths. These are available at your pharmacy.     Apply baking soda paste to the rash. Stir water into baking soda until it reaches a paste-like consistency.     Use an anti-itch lotion.     Take over-the-counter diphenhydramine medicine by mouth as the instructions direct.     Avoid scratching. Scratching may cause the rash to become infected. If itching is very bad, your caregiver may suggest prescription lotions or creams to lessen your symptoms.     Avoid hot showers, which can make itching worse. A cold shower may help with itching as long as you use a moisturizer after the shower.  SEEK MEDICAL CARE IF: The itching does not go away after several days. Document Released: 04/15/2011 Document Reviewed: 01/05/2011 [facility] Patient Information 2012 [place], [place].

## 2011-06-23 NOTE — Progress Notes (Signed)
Subjective:    Patient ID: [name], female    DOB: May 02, 1971, 40 y.o.   MRN: [id number]  HPI [name] presents with a c/o generalized pruritus. She will excoriate herself in trying to relieve her discomfort. She has no new contact allergen: bedding, detergents, body lotions or cosmetics. She has no h/o liver disease and has not been jaundiced, had abdominal pain, dry skin, alteration in temperature regulation. Her symptoms have present for 1 week +.  Past Medical History  Diagnosis Date  . ANXIETY 08/04/2007  . DEPRESSION 08/04/2007  . Other disorders of Eustachian tube 07/25/2010  . SINUSITIS- ACUTE-NOS 07/25/2010  . URI 03/26/2008  . ALLERGIC RHINITIS 08/04/2007  . GERD 08/04/2007  . UTI 08/04/2007  . VAGINITIS 08/04/2007  . FATIGUE 08/04/2007  . Dysuria 11/10/2007  . Abdominal pain, left lower quadrant 03/26/2008  . ABDOMINAL PAIN, SUPRAPUBIC 08/04/2007   No past surgical history on file. Family History  Problem Relation Age of Onset  . Asthma Mother    History   Social History  . Marital Status: Married    Spouse Name: N/A    Number of Children: N/A  . Years of Education: N/A   Occupational History  . Not on file.   Social History Main Topics  . Smoking status: Never Smoker   . Smokeless tobacco: Not on file  . Alcohol Use: No  . Drug Use: No  . Sexually Active: Not on file         Review of Systems Constitutional:  Negative for fever, chills, activity change and unexpected weight change.  HEENT:  Negative for hearing loss, ear pain, congestion, neck stiffness and postnasal drip. Negative for sore throat or swallowing problems. Negative for dental complaints.   Eyes: Negative for vision loss or change in visual acuity.  Respiratory: Negative for chest tightness and wheezing. Negative for DOE.   Cardiovascular: Negative for chest pain or palpitations. No decreased exercise tolerance Gastrointestinal: No change in bowel habit. No bloating or gas. No reflux or  indigestion Genitourinary: Negative for urgency, frequency, flank pain and difficulty urinating.  Musculoskeletal: Negative for myalgias, back pain, arthralgias and gait problem.  Neurological: Negative for dizziness, tremors, weakness and headaches.  Hematological: Negative for adenopathy.  Psychiatric/Behavioral: Negative for behavioral problems and dysphoric mood.      Objective:   Physical Exam Vitals reviewed and stable Gen'l - WNWD asian woman in no acute distress HEENT - C&S clear w/o icterus, PERRLA Neck- no thyromegaly Cor- RRR Pulm - normal respirations Abdomen- no guarding or rebound, no hepatomegaly, no tenderness Derm - old excoriations back, arms. No lesions, no rash.  Lab Results  Component Value Date   WBC 4.5 06/22/2011   HGB 13.3 06/22/2011   HCT 39.6 06/22/2011   PLT 241.0 06/22/2011   GLUCOSE 82 11/19/2010   CHOL 183 11/19/2010   TRIG 32.0 11/19/2010   HDL 48.10 11/19/2010   LDLCALC 129* 11/19/2010   ALT 17 06/22/2011   AST 18 06/22/2011   NA 139 11/19/2010   K 4.0 11/19/2010   CL 103 11/19/2010   CREATININE 0.5 11/19/2010   BUN 8 11/19/2010   CO2 29 11/19/2010   TSH 0.71 06/22/2011          Assessment & Plan:  Pruritus - patient with genralized itching. All lab is normal: no liver abnormality, normal thyroid, normal CBC with normal differential.  Plan - symptomatic treatment: loratadine 10 mg bid, ranitidine 150 mg bid, tepid soaks.  For persistent symptoms will check for occult hepatitis (unlikely w/ normal LFTs)

## 2011-06-27 ENCOUNTER — Other Ambulatory Visit: Payer: Self-pay | Admitting: Internal Medicine

## 2011-06-27 ENCOUNTER — Encounter: Payer: Self-pay | Admitting: Internal Medicine

## 2011-06-27 ENCOUNTER — Ambulatory Visit (INDEPENDENT_AMBULATORY_CARE_PROVIDER_SITE_OTHER): Payer: Managed Care, Other (non HMO) | Admitting: Internal Medicine

## 2011-06-27 ENCOUNTER — Encounter: Payer: Self-pay | Admitting: *Deleted

## 2011-06-27 VITALS — BP 102/64 | HR 77 | Temp 97.9°F | Ht 67.0 in | Wt 168.0 lb

## 2011-06-27 DIAGNOSIS — T7840XA Allergy, unspecified, initial encounter: Secondary | ICD-10-CM

## 2011-06-27 MED ORDER — METHYLPREDNISOLONE ACETATE 80 MG/ML IJ SUSP
80.0000 mg | Freq: Once | INTRAMUSCULAR | Status: AC
  Administered 2011-06-27: 80 mg via INTRAMUSCULAR

## 2011-06-27 MED ORDER — PREDNISONE 20 MG PO TABS: 40.0000 mg | ORAL_TABLET | Freq: Every day | ORAL | Status: AC

## 2011-06-27 MED ORDER — HYDROXYZINE HCL 25 MG PO TABS: 25.0000 mg | ORAL_TABLET | Freq: Three times a day (TID) | ORAL | Status: DC | PRN

## 2011-06-27 NOTE — Assessment & Plan Note (Signed)
No evidence of systemic disease per Dr Debby Bud' eval Will treat with steroids and for itch May need eval by allergist if persists

## 2011-06-27 NOTE — Progress Notes (Signed)
  Subjective:    Patient ID: [name], female    DOB: 06-Jan-1971, 40 y.o.   MRN: [id number]  HPI Itching has worsened Gets burning sensation Keeps her up at night  Tried the loratadine and zantac without effect  Gets intermittent on face and hands No environmental exposures  Dr Debby Bud' note reviewed  Current Outpatient Prescriptions on File Prior to Visit  Medication Sig Dispense Refill  . Chlorpheniramine-Pseudoeph (SUDAHIST) 12-120 MG TB12 Take by mouth 2 (two) times daily.        Marland Kitchen escitalopram (LEXAPRO) 10 MG tablet Take 1 tablet (10 mg total) by mouth daily.  30 tablet  11  . fluticasone (FLONASE) 50 MCG/ACT nasal spray 2 sprays by Nasal route daily.  16 g  2  . HYDROcodone-homatropine (HYCODAN) 5-1.5 MG/5ML syrup Take by mouth every 6 (six) hours as needed.        Marland Kitchen omeprazole (PRILOSEC) 20 MG capsule Take 1 capsule (20 mg total) by mouth daily.  30 capsule  11  . fluconazole (DIFLUCAN) 150 MG tablet Take 150 mg by mouth. 1 by mouth every 3 days as needed for yeast infection         No Known Allergies  Past Medical History  Diagnosis Date  . ANXIETY 08/04/2007  . DEPRESSION 08/04/2007  . Other disorders of Eustachian tube 07/25/2010  . SINUSITIS- ACUTE-NOS 07/25/2010  . URI 03/26/2008  . ALLERGIC RHINITIS 08/04/2007  . GERD 08/04/2007  . UTI 08/04/2007  . VAGINITIS 08/04/2007  . FATIGUE 08/04/2007  . Dysuria 11/10/2007  . Abdominal pain, left lower quadrant 03/26/2008  . ABDOMINAL PAIN, SUPRAPUBIC 08/04/2007    No past surgical history on file.  Family History  Problem Relation Age of Onset  . Asthma Mother     History   Social History  . Marital Status: Married    Spouse Name: N/A    Number of Children: N/A  . Years of Education: N/A   Occupational History  . Not on file.   Social History Main Topics  . Smoking status: Never Smoker   . Smokeless tobacco: Not on file  . Alcohol Use: No  . Drug Use: No  . Sexually Active: Not on file   Other Topics  Concern  . Not on file   Social History Narrative  . No narrative on file    Review of Systems No nausea or vomiting Able to eat okay    Objective:   Physical Exam  Constitutional: She appears well-developed and well-nourished.       Uncomfortable with itching  HENT:  Mouth/Throat: Oropharynx is clear and moist. No oropharyngeal exudate.  Neck: Normal range of motion.  Pulmonary/Chest: Effort normal and breath sounds normal. No respiratory distress. She has no wheezes. She has no rales.  Abdominal: Soft. There is no tenderness.  Lymphadenopathy:    She has no cervical adenopathy.  Skin:       Various red areas on ears and hands          Assessment & Plan:

## 2011-06-29 ENCOUNTER — Other Ambulatory Visit: Payer: Self-pay

## 2011-06-29 MED ORDER — ZOLPIDEM TARTRATE 10 MG PO TABS: 10.0000 mg | ORAL_TABLET | Freq: Every evening | ORAL | Status: DC | PRN

## 2011-06-29 NOTE — Telephone Encounter (Signed)
Faxed hardcopy to [address] and [place] at [phone]

## 2011-06-29 NOTE — Telephone Encounter (Signed)
Prescription faxed

## 2011-07-05 ENCOUNTER — Encounter: Payer: Self-pay | Admitting: Internal Medicine

## 2011-08-18 HISTORY — PX: LASER ABLATION: SHX1947

## 2011-08-24 ENCOUNTER — Telehealth: Payer: Self-pay | Admitting: Internal Medicine

## 2011-08-24 ENCOUNTER — Ambulatory Visit: Payer: Managed Care, Other (non HMO) | Admitting: Internal Medicine

## 2011-08-24 ENCOUNTER — Ambulatory Visit (INDEPENDENT_AMBULATORY_CARE_PROVIDER_SITE_OTHER): Payer: Managed Care, Other (non HMO) | Admitting: Internal Medicine

## 2011-08-24 ENCOUNTER — Encounter: Payer: Self-pay | Admitting: Internal Medicine

## 2011-08-24 VITALS — BP 102/62 | HR 68 | Temp 98.2°F | Ht 67.0 in | Wt 167.0 lb

## 2011-08-24 DIAGNOSIS — H6980 Other specified disorders of Eustachian tube, unspecified ear: Secondary | ICD-10-CM

## 2011-08-24 DIAGNOSIS — F329 Major depressive disorder, single episode, unspecified: Secondary | ICD-10-CM

## 2011-08-24 DIAGNOSIS — J019 Acute sinusitis, unspecified: Secondary | ICD-10-CM

## 2011-08-24 MED ORDER — HYDROCODONE-HOMATROPINE 5-1.5 MG/5ML PO SYRP: 5.0000 mL | ORAL_SOLUTION | Freq: Four times a day (QID) | ORAL | Status: AC | PRN

## 2011-08-24 MED ORDER — LEVOFLOXACIN 250 MG PO TABS: 250.0000 mg | ORAL_TABLET | Freq: Every day | ORAL | Status: DC

## 2011-08-24 MED ORDER — LEVOFLOXACIN 250 MG PO TABS: 250.0000 mg | ORAL_TABLET | Freq: Every day | ORAL | Status: AC

## 2011-08-24 NOTE — Patient Instructions (Addendum)
Take all new medications as prescribed Continue all other medications as before  

## 2011-08-24 NOTE — Assessment & Plan Note (Signed)
Mild to mod, for antibx course,  to f/u any worsening symptoms or concerns 

## 2011-08-24 NOTE — Telephone Encounter (Signed)
Called left message that prescription was sent in again and informed of the pharmacy as well.

## 2011-08-24 NOTE — Assessment & Plan Note (Signed)
stable overall by hx and exam, most recent data reviewed with pt, and pt to continue medical treatment as before  Lab Results  Component Value Date   WBC 4.5 06/22/2011   HGB 13.3 06/22/2011   HCT 39.6 06/22/2011   PLT 241.0 06/22/2011   GLUCOSE 82 11/19/2010   CHOL 183 11/19/2010   TRIG 32.0 11/19/2010   HDL 48.10 11/19/2010   LDLCALC 129* 11/19/2010   ALT 17 06/22/2011   AST 18 06/22/2011   NA 139 11/19/2010   K 4.0 11/19/2010   CL 103 11/19/2010   CREATININE 0.5 11/19/2010   BUN 8 11/19/2010   CO2 29 11/19/2010   TSH 0.71 06/22/2011

## 2011-08-24 NOTE — Telephone Encounter (Signed)
Per pt pharmacy did not receive prescription for sinus infection--Pharm--[facility] [address]

## 2011-08-24 NOTE — Assessment & Plan Note (Signed)
Also for mucinex otc prn,  to f/u any worsening symptoms or concerns  

## 2011-08-24 NOTE — Telephone Encounter (Signed)
Done again per emr, but please note the phamacy to which the med was sent

## 2011-08-24 NOTE — Progress Notes (Signed)
  Subjective:    Patient ID: [name], female    DOB: 1970-10-06, 41 y.o.   MRN: [id number]  HPI   Here with 3 days acute onset fever, facial pain, pressure, general weakness and malaise, and greenish d/c, with slight ST, but little to no cough and Pt denies chest pain, increased sob or doe, wheezing, orthopnea, PND, increased LE swelling, palpitations, dizziness or syncope.  Denies worsening depressive symptoms, suicidal ideation, or panic, though has ongoing anxiety, not increased recently.   Also with bilat ear popping, crackling, without hearing loss, vertigo, n/v.  Past Medical History  Diagnosis Date  . ANXIETY 08/04/2007  . DEPRESSION 08/04/2007  . Other disorders of Eustachian tube 07/25/2010  . SINUSITIS- ACUTE-NOS 07/25/2010  . URI 03/26/2008  . ALLERGIC RHINITIS 08/04/2007  . GERD 08/04/2007  . UTI 08/04/2007  . VAGINITIS 08/04/2007  . FATIGUE 08/04/2007  . Dysuria 11/10/2007  . Abdominal pain, left lower quadrant 03/26/2008  . ABDOMINAL PAIN, SUPRAPUBIC 08/04/2007   No past surgical history on file.  reports that she has never smoked. She does not have any smokeless tobacco history on file. She reports that she does not drink alcohol or use illicit drugs. family history includes Asthma in her mother. No Known Allergies Current Outpatient Prescriptions on File Prior to Visit  Medication Sig Dispense Refill  . escitalopram (LEXAPRO) 10 MG tablet Take 1 tablet (10 mg total) by mouth daily.  30 tablet  11  . omeprazole (PRILOSEC) 20 MG capsule Take 1 capsule (20 mg total) by mouth daily.  30 capsule  11  . zolpidem (AMBIEN) 10 MG tablet TAKE 1 TABLET BY MOUTH AT BEDTIME AS NEEDED FOR SLEEP  30 tablet  5  . Chlorpheniramine-Pseudoeph (SUDAHIST) 12-120 MG TB12 Take by mouth 2 (two) times daily.         Review of Systems Review of Systems  Constitutional: Negative for diaphoresis and unexpected weight change.  HENT: Negative for drooling and tinnitus.   Eyes: Negative for  photophobia and visual disturbance.  Respiratory: Negative for choking and stridor.   Gastrointestinal: Negative for vomiting and blood in stool.  Genitourinary: Negative for hematuria and decreased urine volume.  Objective:   Physical Exam BP 102/62  Pulse 68  Temp(Src) 98.2 F (36.8 C) (Oral)  Ht 5\' 7"  (1.702 m)  Wt 167 lb (75.751 kg)  BMI 26.16 kg/m2  SpO2 98% Physical Exam  VS noted, mild ill Constitutional: Pt appears well-developed and well-nourished.  HENT: Head: Normocephalic.  Right Ear: External ear normal.  Left Ear: External ear normal.  Bilat tm's mild erythema.  Sinus tender bilat.  Pharynx mild erythema Eyes: Conjunctivae and EOM are normal. Pupils are equal, round, and reactive to light.  Neck: Normal range of motion. Neck supple.  Cardiovascular: Normal rate and regular rhythm.   Pulmonary/Chest: Effort normal and breath sounds normal.  Neurological: Pt is alert. No cranial nerve deficit.  Skin: Skin is warm. No erythema.  Psychiatric: Pt behavior is normal. Thought content normal. 1+ nervous, not depressed affect    Assessment & Plan:

## 2011-11-19 ENCOUNTER — Ambulatory Visit (INDEPENDENT_AMBULATORY_CARE_PROVIDER_SITE_OTHER): Payer: Managed Care, Other (non HMO) | Admitting: Internal Medicine

## 2011-11-19 ENCOUNTER — Telehealth: Payer: Self-pay

## 2011-11-19 ENCOUNTER — Encounter: Payer: Self-pay | Admitting: Internal Medicine

## 2011-11-19 VITALS — BP 100/70 | HR 79 | Temp 98.3°F | Ht 67.0 in | Wt 171.2 lb

## 2011-11-19 DIAGNOSIS — J069 Acute upper respiratory infection, unspecified: Secondary | ICD-10-CM

## 2011-11-19 DIAGNOSIS — K120 Recurrent oral aphthae: Secondary | ICD-10-CM

## 2011-11-19 DIAGNOSIS — R42 Dizziness and giddiness: Secondary | ICD-10-CM

## 2011-11-19 DIAGNOSIS — Z0000 Encounter for general adult medical examination without abnormal findings: Secondary | ICD-10-CM

## 2011-11-19 DIAGNOSIS — M549 Dorsalgia, unspecified: Secondary | ICD-10-CM

## 2011-11-19 MED ORDER — TRIAMCINOLONE ACETONIDE 0.1 % MT PSTE: PASTE | Freq: Two times a day (BID) | OROMUCOSAL | Status: DC

## 2011-11-19 MED ORDER — HYDROXYZINE HCL 25 MG PO TABS: 25.0000 mg | ORAL_TABLET | Freq: Four times a day (QID) | ORAL | Status: DC | PRN

## 2011-11-19 MED ORDER — AZITHROMYCIN 250 MG PO TABS: ORAL_TABLET | ORAL | Status: AC

## 2011-11-19 MED ORDER — MECLIZINE HCL 12.5 MG PO TABS: 12.5000 mg | ORAL_TABLET | Freq: Three times a day (TID) | ORAL | Status: DC | PRN

## 2011-11-19 MED ORDER — MECLIZINE HCL 12.5 MG PO TABS: 12.5000 mg | ORAL_TABLET | Freq: Three times a day (TID) | ORAL | Status: AC | PRN

## 2011-11-19 MED ORDER — AZITHROMYCIN 250 MG PO TABS: ORAL_TABLET | ORAL | Status: DC

## 2011-11-19 NOTE — Patient Instructions (Addendum)
Take all new medications as prescribed Continue all other medications as before You can also take Delsym OTC for cough, and/or Mucinex (or it's generic off brand) for congestion Please return in 3 mo with Lab testing done 3-5 days before  

## 2011-11-19 NOTE — Telephone Encounter (Signed)
Prescriptions sent in to correct pharmacy.

## 2011-11-22 ENCOUNTER — Encounter: Payer: Self-pay | Admitting: Internal Medicine

## 2011-11-22 DIAGNOSIS — M549 Dorsalgia, unspecified: Secondary | ICD-10-CM | POA: Insufficient documentation

## 2011-11-22 DIAGNOSIS — K120 Recurrent oral aphthae: Secondary | ICD-10-CM | POA: Insufficient documentation

## 2011-11-22 DIAGNOSIS — R42 Dizziness and giddiness: Secondary | ICD-10-CM | POA: Insufficient documentation

## 2011-11-22 DIAGNOSIS — J069 Acute upper respiratory infection, unspecified: Secondary | ICD-10-CM | POA: Insufficient documentation

## 2011-11-22 NOTE — Assessment & Plan Note (Signed)
For kenalog in orabase -  to f/u any worsening symptoms or concerns

## 2011-11-22 NOTE — Assessment & Plan Note (Signed)
Mild, likely due to acute illness, inner ear related most likely, for meclizine prn

## 2011-11-22 NOTE — Assessment & Plan Note (Signed)
C/w mild Msk strain most likely,  to f/u any worsening symptoms or concerns, tylenol prn

## 2011-11-22 NOTE — Assessment & Plan Note (Signed)
Mild to mod, for antibx course,  to f/u any worsening symptoms or concerns 

## 2011-11-22 NOTE — Progress Notes (Signed)
  Subjective:    Patient ID: [name], female    DOB: [birth year], 41 y.o.   MRN: [id number]  HPI   Here with 3 days acute onset fever, facial and left ear discomfort and pressure, general weakness and malaise, and greenish d/c, with slight ST, but little to no cough and Pt denies chest pain, increased sob or doe, wheezing, orthopnea, PND, increased LE swelling, palpitations, dizziness or syncope, except for onset mild vertigo yesterday, as well as left lower inner lip small tender ulceration such as she has had prior as well.  Also incidentally with right LBP with some radiation to the right groin/rlq for 8 hrs, mild, worse to walk, nothing makes better and no LE pain/weak/numb, gait change or fall.  Denies worsening depressive symptoms, suicidal ideation, or panic, though has ongoing anxiety Past Medical History  Diagnosis Date  . ANXIETY 08/04/2007  . DEPRESSION 08/04/2007  . Other disorders of Eustachian tube 07/25/2010  . SINUSITIS- ACUTE-NOS 07/25/2010  . URI 03/26/2008  . ALLERGIC RHINITIS 08/04/2007  . GERD 08/04/2007  . UTI 08/04/2007  . VAGINITIS 08/04/2007  . FATIGUE 08/04/2007  . Dysuria 11/10/2007  . Abdominal pain, left lower quadrant 03/26/2008  . ABDOMINAL PAIN, SUPRAPUBIC 08/04/2007   No past surgical history on file.  reports that she has never smoked. She does not have any smokeless tobacco history on file. She reports that she does not drink alcohol or use illicit drugs. family history includes Asthma in her mother. No Known Allergies Current Outpatient Prescriptions on File Prior to Visit  Medication Sig Dispense Refill  . escitalopram (LEXAPRO) 10 MG tablet Take 1 tablet (10 mg total) by mouth daily.  30 tablet  11  . omeprazole (PRILOSEC) 20 MG capsule Take 1 capsule (20 mg total) by mouth daily.  30 capsule  11  . zolpidem (AMBIEN) 10 MG tablet TAKE 1 TABLET BY MOUTH AT BEDTIME AS NEEDED FOR SLEEP  30 tablet  5   Review of Systems Review of Systems  Constitutional:  Negative for diaphoresis and unexpected weight change.  HENT: Negative for drooling and tinnitus.   Eyes: Negative for photophobia and visual disturbance.  Respiratory: Negative for choking and stridor.   Gastrointestinal: Negative for vomiting and blood in stool.  Genitourinary: Negative for hematuria and decreased urine volume.  Musculoskeletal: Negative for gait problem.  Skin: Negative for color change and wound.     Objective:   Physical Exam Blood pressure 100/70, pulse 79, temperature 98.3 F (36.8 C), temperature source Oral, height 5\' 7"  (1.702 m), weight 171 lb 4 oz (77.678 kg), SpO2 97.00%. Physical Exam  VS noted, mild ill Constitutional: Pt appears well-developed and well-nourished.  HENT: Head: Normocephalic.  Right Ear: External ear normal.  Left Ear: External ear normal.  Bilat tm's mild erythema, left > right.  Sinus nontender.  Pharynx mild erythema Eyes: Conjunctivae and EOM are normal. Pupils are equal, round, and reactive to light.  Neck: Normal range of motion. Neck supple.  Cardiovascular: Normal rate and regular rhythm.   Pulmonary/Chest: Effort normal and breath sounds normal.  Abd:  Soft, NT, non-distended, + BS Spine nontender, mild right left paravertebral spasm noted Neurological: Pt is alert. No cranial nerve deficit. motor/dtr intact to LE's Skin: Skin is warm. No erythema. No rash   Assessment & Plan:

## 2011-12-25 ENCOUNTER — Other Ambulatory Visit (HOSPITAL_COMMUNITY): Payer: Self-pay | Admitting: Obstetrics and Gynecology

## 2011-12-25 DIAGNOSIS — R109 Unspecified abdominal pain: Secondary | ICD-10-CM

## 2011-12-29 ENCOUNTER — Ambulatory Visit (HOSPITAL_COMMUNITY): Payer: Managed Care, Other (non HMO)

## 2012-01-08 ENCOUNTER — Ambulatory Visit (HOSPITAL_COMMUNITY)
Admission: RE | Admit: 2012-01-08 | Discharge: 2012-01-08 | Disposition: A | Discharge status: Home / Self Care | Payer: Managed Care, Other (non HMO) | Source: Ambulatory Visit | Attending: Obstetrics and Gynecology | Admitting: Obstetrics and Gynecology

## 2012-01-08 DIAGNOSIS — R109 Unspecified abdominal pain: Secondary | ICD-10-CM

## 2012-01-08 DIAGNOSIS — R1012 Left upper quadrant pain: Secondary | ICD-10-CM | POA: Insufficient documentation

## 2012-01-20 ENCOUNTER — Other Ambulatory Visit: Payer: Self-pay | Admitting: Obstetrics and Gynecology

## 2012-02-15 ENCOUNTER — Ambulatory Visit: Payer: Managed Care, Other (non HMO) | Admitting: Internal Medicine

## 2012-03-14 NOTE — H&P (Signed)
[name]  DICTATION # [id number] CSN# [id number]   Meriel Pica, MD 03/14/2012 2:14 PM

## 2012-03-15 ENCOUNTER — Encounter (HOSPITAL_COMMUNITY): Payer: Self-pay

## 2012-03-15 ENCOUNTER — Encounter (HOSPITAL_COMMUNITY)
Admission: RE | Admit: 2012-03-15 | Discharge: 2012-03-15 | Disposition: A | Discharge status: Home / Self Care | Payer: Managed Care, Other (non HMO) | Source: Ambulatory Visit | Attending: Obstetrics and Gynecology | Admitting: Obstetrics and Gynecology

## 2012-03-15 HISTORY — DX: Insomnia, unspecified: G47.00

## 2012-03-15 LAB — CBC
MCHC: 32.6 g/dL (ref 30.0–36.0)
MCV: 91.5 fL (ref 78.0–100.0)
Platelets: 226 10*3/uL (ref 150–400)
RDW: 13.1 % (ref 11.5–15.5)
WBC: 5.3 10*3/uL (ref 4.0–10.5)

## 2012-03-15 LAB — SURGICAL PCR SCREEN: Staphylococcus aureus: NEGATIVE

## 2012-03-15 NOTE — Patient Instructions (Addendum)
   Your procedure is scheduled on: Wednesday, 03/23/12  Enter through the Main Entrance of [facility] at: 6 am Pick up the phone at the desk and dial [phone] and inform us of your arrival.  Please call this number if you have any problems the morning of surgery: [phone]  Remember: Do not eat food after midnight: Tuesday Do not drink clear liquids after: Tuesday Take these medicines the morning of surgery with a SIP OF WATER: prilosec  Do not wear jewelry, make-up, or FINGER nail polish No metal in your hair or on your body. Do not wear lotions, powders, perfumes or deodorant. Do not shave 48 hours prior to surgery. Do not bring valuables to the hospital. Contacts, dentures or bridgework may not be worn into surgery.  Patients discharged on the day of surgery will not be allowed to drive home.  Patient to arrange for a ride home, instructed that she can not drive for 24 hours after surgery.  Patient verbalized understanding.   Remember to use your hibiclens as instructed.Please shower with 1/2 bottle the evening before your surgery and the other 1/2 bottle the morning of surgery. Neck down avoiding private area.

## 2012-03-15 NOTE — H&P (Signed)
NAMEALLYSE, FREGEAU                    ACCOUNT NO.:  [id number]  MEDICAL RECORD NO.:  [id number]  LOCATION:                                 FACILITY:  PHYSICIAN:  Duke Salvia. Marcelle Overlie, M.D.DATE OF BIRTH:  Jan 05, 1971  DATE OF ADMISSION: DATE OF DISCHARGE:                             HISTORY & PHYSICAL   CHIEF COMPLAINT:  Pelvic pain, VAIN 1.  HPI:  A 41 year old, G6, P4.  This patient underwent TAH for fibroids in 2009.  Ovaries were normal at that time.  Over the last 6-12 months, she has experienced worsening left lower quadrant pain and has also been followed for abnormal vaginal cuff Pap smear that showed persistent mild dysplasia.  She subsequently underwent colposcopy with biopsy on 01/20/2012 that showed VAIN 1 with koilocytic atypia.  She presented at this time for laser ablation of VAIN 1 and diagnostic laparoscopy to rule out adnexal adhesions.  This procedure including risks related to bleeding, infection, adjacent organ injury, the possible need for open additional surgery discussed along with the need for careful followup cytology reviewed with her.  PAST MEDICAL HISTORY:  None.  ALLERGIES:  None.  CURRENT MEDICATIONS:  Ambien p.r.n.  OPERATIVE HISTORY:  She had TAH in 2009.  She had a breast needle biopsy 2012 that was benign.  SOCIAL HISTORY:  Denies alcohol, tobacco, or drug use.  She is married.  FAMILY HISTORY:  Otherwise unremarkable.  PHYSICAL EXAMINATION:  VITAL SIGNS:  Temp 98.2, blood pressure 130/82. HEENT:  Unremarkable. NECK:  Supple without masses. LUNGS:  Clear. CARDIOVASCULAR:  Regular rate and rhythm without murmurs, rubs, gallops. BREASTS:  Without masses. ABDOMEN:  Soft, flat, nontender. PELVIC:  Normal external genitalia.  Vaginal cuff is clear.  Bimanual revealed tenderness on the left.  No masses or nodularity noted.  NOTE:  Pelvic ultrasound 01/08/2012 showed negative abdominal pelvic ultrasound.  IMPRESSION: 1. Vaginal intraepithelial  neoplasia 1. 2. Pelvic pain, rule out adnexal adhesions.  PLAN:  Diagnostic laparoscopy with CO2 laser ablation of vaginal cuff dysplasia, mild.  Procedure and risks discussed as above.     Rockney Grenz M. Marcelle Overlie, M.D.     [facility]/MEDQ  D:  03/14/2012  T:  03/15/2012  Job:  [id number]

## 2012-03-19 ENCOUNTER — Encounter (HOSPITAL_COMMUNITY): Payer: Self-pay | Admitting: Pharmacist

## 2012-03-22 NOTE — Progress Notes (Signed)
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  Reviewed subcuticular closeure and injection of dilute Kenalog to reduce scarring. Remainder of procedure>>DL, laser abl of VAIN I remains the same

## 2012-03-23 ENCOUNTER — Encounter (HOSPITAL_COMMUNITY): Payer: Self-pay | Admitting: Anesthesiology

## 2012-03-23 ENCOUNTER — Encounter (HOSPITAL_COMMUNITY)
Admission: RE | Disposition: A | Discharge status: Home / Self Care | Payer: Self-pay | Source: Ambulatory Visit | Attending: Obstetrics and Gynecology

## 2012-03-23 ENCOUNTER — Ambulatory Visit (HOSPITAL_COMMUNITY)
Admission: RE | Admit: 2012-03-23 | Discharge: 2012-03-23 | Disposition: A | Discharge status: Home / Self Care | Payer: Managed Care, Other (non HMO) | Source: Ambulatory Visit | Attending: Obstetrics and Gynecology | Admitting: Obstetrics and Gynecology

## 2012-03-23 ENCOUNTER — Ambulatory Visit (HOSPITAL_COMMUNITY): Payer: Managed Care, Other (non HMO) | Admitting: Anesthesiology

## 2012-03-23 DIAGNOSIS — N893 Dysplasia of vagina, unspecified: Secondary | ICD-10-CM | POA: Insufficient documentation

## 2012-03-23 DIAGNOSIS — L910 Hypertrophic scar: Secondary | ICD-10-CM | POA: Insufficient documentation

## 2012-03-23 DIAGNOSIS — N949 Unspecified condition associated with female genital organs and menstrual cycle: Secondary | ICD-10-CM | POA: Insufficient documentation

## 2012-03-23 HISTORY — PX: LAPAROSCOPY: SHX197

## 2012-03-23 HISTORY — PX: SCAR REVISION: SHX5285

## 2012-03-23 SURGERY — LAPAROSCOPY OPERATIVE
Anesthesia: General | Site: Vagina | Wound class: Clean Contaminated

## 2012-03-23 MED ORDER — BUPIVACAINE HCL (PF) 0.25 % IJ SOLN
INTRAMUSCULAR | Status: AC
  Filled 2012-03-23: qty 30

## 2012-03-23 MED ORDER — ESTRADIOL 0.1 MG/GM VA CREA
TOPICAL_CREAM | VAGINAL | Status: AC
  Filled 2012-03-23: qty 42.5

## 2012-03-23 MED ORDER — NEOSTIGMINE METHYLSULFATE 1 MG/ML IJ SOLN
INTRAMUSCULAR | Status: AC
  Filled 2012-03-23: qty 10

## 2012-03-23 MED ORDER — SODIUM CHLORIDE 0.9 % IJ SOLN
INTRAMUSCULAR | Status: DC | PRN
  Administered 2012-03-23: 3 mL via INTRAVENOUS

## 2012-03-23 MED ORDER — METOCLOPRAMIDE HCL 5 MG/ML IJ SOLN
10.0000 mg | Freq: Once | INTRAMUSCULAR | Status: AC
  Administered 2012-03-23: 10 mg via INTRAVENOUS

## 2012-03-23 MED ORDER — TRIAMCINOLONE ACETONIDE 40 MG/ML IJ SUSP
INTRAMUSCULAR | Status: DC | PRN
  Administered 2012-03-23: 40 mg via INTRAMUSCULAR

## 2012-03-23 MED ORDER — TRIAMCINOLONE ACETONIDE 40 MG/ML IJ SUSP
40.0000 mg | Freq: Once | INTRAMUSCULAR | Status: DC
  Filled 2012-03-23: qty 1

## 2012-03-23 MED ORDER — MIDAZOLAM HCL 2 MG/2ML IJ SOLN
INTRAMUSCULAR | Status: AC
  Filled 2012-03-23: qty 2

## 2012-03-23 MED ORDER — OXYCODONE-ACETAMINOPHEN 5-325 MG PO TABS: ORAL_TABLET | ORAL | Status: DC

## 2012-03-23 MED ORDER — LIDOCAINE HCL (CARDIAC) 20 MG/ML IV SOLN
INTRAVENOUS | Status: AC
  Filled 2012-03-23: qty 5

## 2012-03-23 MED ORDER — DEXTROSE IN LACTATED RINGERS 5 % IV SOLN: INTRAVENOUS | Status: DC

## 2012-03-23 MED ORDER — FENTANYL CITRATE 0.05 MG/ML IJ SOLN
INTRAMUSCULAR | Status: AC
  Filled 2012-03-23: qty 5

## 2012-03-23 MED ORDER — NEOSTIGMINE METHYLSULFATE 1 MG/ML IJ SOLN
INTRAMUSCULAR | Status: DC | PRN
  Administered 2012-03-23: 3 mg via INTRAVENOUS

## 2012-03-23 MED ORDER — KETOROLAC TROMETHAMINE 30 MG/ML IJ SOLN
INTRAMUSCULAR | Status: AC
  Filled 2012-03-23: qty 1

## 2012-03-23 MED ORDER — ONDANSETRON HCL 4 MG/2ML IJ SOLN
INTRAMUSCULAR | Status: DC | PRN
  Administered 2012-03-23: 4 mg via INTRAVENOUS

## 2012-03-23 MED ORDER — CEFAZOLIN SODIUM-DEXTROSE 2-3 GM-% IV SOLR
INTRAVENOUS | Status: AC
  Administered 2012-03-23: 2 g via INTRAVENOUS
  Filled 2012-03-23: qty 50

## 2012-03-23 MED ORDER — HYDROMORPHONE HCL PF 1 MG/ML IJ SOLN: 0.2500 mg | INTRAMUSCULAR | Status: DC | PRN

## 2012-03-23 MED ORDER — KETOROLAC TROMETHAMINE 30 MG/ML IJ SOLN: 15.0000 mg | Freq: Once | INTRAMUSCULAR | Status: DC | PRN

## 2012-03-23 MED ORDER — ONDANSETRON HCL 4 MG/2ML IJ SOLN
INTRAMUSCULAR | Status: AC
  Filled 2012-03-23: qty 2

## 2012-03-23 MED ORDER — LIDOCAINE HCL (CARDIAC) 20 MG/ML IV SOLN
INTRAVENOUS | Status: DC | PRN
  Administered 2012-03-23: 40 mg via INTRAVENOUS

## 2012-03-23 MED ORDER — ROCURONIUM BROMIDE 100 MG/10ML IV SOLN
INTRAVENOUS | Status: DC | PRN
  Administered 2012-03-23: 40 mg via INTRAVENOUS

## 2012-03-23 MED ORDER — ROCURONIUM BROMIDE 50 MG/5ML IV SOLN
INTRAVENOUS | Status: AC
  Filled 2012-03-23: qty 1

## 2012-03-23 MED ORDER — PROPOFOL 10 MG/ML IV EMUL
INTRAVENOUS | Status: AC
  Filled 2012-03-23: qty 20

## 2012-03-23 MED ORDER — KETOROLAC TROMETHAMINE 30 MG/ML IJ SOLN
INTRAMUSCULAR | Status: DC | PRN
  Administered 2012-03-23: 30 mg via INTRAVENOUS

## 2012-03-23 MED ORDER — DEXAMETHASONE SODIUM PHOSPHATE 10 MG/ML IJ SOLN
INTRAMUSCULAR | Status: DC | PRN
  Administered 2012-03-23: 10 mg via INTRAVENOUS

## 2012-03-23 MED ORDER — CEFAZOLIN SODIUM-DEXTROSE 2-3 GM-% IV SOLR: 2.0000 g | INTRAVENOUS | Status: DC

## 2012-03-23 MED ORDER — MIDAZOLAM HCL 5 MG/5ML IJ SOLN
INTRAMUSCULAR | Status: DC | PRN
  Administered 2012-03-23: 2 mg via INTRAVENOUS

## 2012-03-23 MED ORDER — BUPIVACAINE HCL (PF) 0.25 % IJ SOLN
INTRAMUSCULAR | Status: DC | PRN
  Administered 2012-03-23: 10 mL

## 2012-03-23 MED ORDER — GLYCOPYRROLATE 0.2 MG/ML IJ SOLN
INTRAMUSCULAR | Status: AC
  Filled 2012-03-23: qty 1

## 2012-03-23 MED ORDER — PROPOFOL 10 MG/ML IV EMUL
INTRAVENOUS | Status: DC | PRN
  Administered 2012-03-23: 140 mg via INTRAVENOUS

## 2012-03-23 MED ORDER — GLYCOPYRROLATE 0.2 MG/ML IJ SOLN
INTRAMUSCULAR | Status: DC | PRN
  Administered 2012-03-23: 0.4 mg via INTRAVENOUS
  Administered 2012-03-23: .2 mg via INTRAVENOUS

## 2012-03-23 MED ORDER — FENTANYL CITRATE 0.05 MG/ML IJ SOLN
INTRAMUSCULAR | Status: DC | PRN
  Administered 2012-03-23: 100 ug via INTRAVENOUS
  Administered 2012-03-23 (×3): 50 ug via INTRAVENOUS

## 2012-03-23 MED ORDER — METOCLOPRAMIDE HCL 5 MG/ML IJ SOLN
INTRAMUSCULAR | Status: AC
  Administered 2012-03-23: 10 mg via INTRAVENOUS
  Filled 2012-03-23: qty 2

## 2012-03-23 MED ORDER — LACTATED RINGERS IV SOLN
INTRAVENOUS | Status: DC
  Administered 2012-03-23: 07:00:00 via INTRAVENOUS
  Administered 2012-03-23: 50 mL/h via INTRAVENOUS
  Administered 2012-03-23: 08:00:00 via INTRAVENOUS

## 2012-03-23 MED ORDER — DEXAMETHASONE SODIUM PHOSPHATE 10 MG/ML IJ SOLN
INTRAMUSCULAR | Status: AC
  Filled 2012-03-23: qty 1

## 2012-03-23 SURGICAL SUPPLY — 45 items
ADH SKN CLS APL DERMABOND .7 (GAUZE/BANDAGES/DRESSINGS) ×3
BAG SPEC RTRVL LRG 6X4 10 (ENDOMECHANICALS)
CABLE HIGH FREQUENCY MONO STRZ (ELECTRODE) IMPLANT
CATH ROBINSON RED A/P 16FR (CATHETERS) ×4 IMPLANT
CLOTH BEACON ORANGE TIMEOUT ST (SAFETY) ×4 IMPLANT
CONTAINER PREFILL 10% NBF 60ML (FORM) ×8 IMPLANT
DERMABOND ADVANCED (GAUZE/BANDAGES/DRESSINGS) ×1
DERMABOND ADVANCED .7 DNX12 (GAUZE/BANDAGES/DRESSINGS) ×3 IMPLANT
DRESSING TELFA 8X3 (GAUZE/BANDAGES/DRESSINGS) ×2 IMPLANT
DRSG COVADERM 4X10 (GAUZE/BANDAGES/DRESSINGS) ×2 IMPLANT
GAUZE SPONGE 4X4 12PLY STRL LF (GAUZE/BANDAGES/DRESSINGS) ×2 IMPLANT
GLOVE BIO SURGEON STRL SZ7 (GLOVE) ×8 IMPLANT
GLOVE BIOGEL PI IND STRL 6.5 (GLOVE) IMPLANT
GLOVE BIOGEL PI INDICATOR 6.5 (GLOVE)
GLOVE SURG SS PI 7.0 STRL IVOR (GLOVE) ×8 IMPLANT
GOWN PREVENTION PLUS LG XLONG (DISPOSABLE) ×8 IMPLANT
NDL INSUFFLATION 14GA 120MM (NEEDLE) ×2 IMPLANT
NDL SPNL 22GX3.5 QUINCKE BK (NEEDLE) ×2 IMPLANT
NEEDLE INSUFFLATION 14GA 120MM (NEEDLE) ×4 IMPLANT
NEEDLE SPNL 22GX3.5 QUINCKE BK (NEEDLE) ×8 IMPLANT
NS IRRIG 1000ML POUR BTL (IV SOLUTION) ×4 IMPLANT
PACK LAPAROSCOPY BASIN (CUSTOM PROCEDURE TRAY) ×4 IMPLANT
PACK VAGINAL MINOR WOMEN LF (CUSTOM PROCEDURE TRAY) ×4 IMPLANT
PAD ABD 7.5X8 STRL (GAUZE/BANDAGES/DRESSINGS) ×2 IMPLANT
PAD PREP 24X48 CUFFED NSTRL (MISCELLANEOUS) ×4 IMPLANT
PENCIL BUTTON HOLSTER BLD 10FT (ELECTRODE) ×2 IMPLANT
POUCH SPECIMEN RETRIEVAL 10MM (ENDOMECHANICALS) IMPLANT
PROTECTOR NERVE ULNAR (MISCELLANEOUS) ×4 IMPLANT
SCOPETTES 8  STERILE (MISCELLANEOUS) ×1
SCOPETTES 8 STERILE (MISCELLANEOUS) ×1 IMPLANT
SEALER TISSUE G2 CVD JAW 35 (ENDOMECHANICALS) IMPLANT
SEALER TISSUE G2 CVD JAW 45CM (ENDOMECHANICALS) IMPLANT
SET IRRIG TUBING LAPAROSCOPIC (IRRIGATION / IRRIGATOR) IMPLANT
SUT MON AB 2-0 CT1 36 (SUTURE) ×12 IMPLANT
SUT MON AB 4-0 PS1 27 (SUTURE) ×4 IMPLANT
SUT VIC AB 2-0 CT1 18 (SUTURE) ×8 IMPLANT
SUT VICRYL 0 TIES 12 18 (SUTURE) ×4 IMPLANT
SUT VICRYL RAPIDE 4/0 PS 2 (SUTURE) ×8 IMPLANT
SYR CONTROL 10ML LL (SYRINGE) ×6 IMPLANT
TAPE CLOTH SURG 4X10 WHT LF (GAUZE/BANDAGES/DRESSINGS) ×2 IMPLANT
TOWEL OR 17X24 6PK STRL BLUE (TOWEL DISPOSABLE) ×8 IMPLANT
TROCAR Z-THREAD BLADED 11X100M (TROCAR) ×4 IMPLANT
TROCAR Z-THREAD BLADED 5X100MM (TROCAR) ×8 IMPLANT
WARMER LAPAROSCOPE (MISCELLANEOUS) ×4 IMPLANT
WATER STERILE IRR 1000ML POUR (IV SOLUTION) ×4 IMPLANT

## 2012-03-23 NOTE — Anesthesia Postprocedure Evaluation (Signed)
  Anesthesia Post-op Note  Patient: [name]  Procedure(s) Performed: Procedure(s) (LRB): LAPAROSCOPY OPERATIVE (N/A) SCAR REVISION (N/A) CO2 LASER APPLICATION (N/A)  Patient Location: PACU  Anesthesia Type: General  Level of Consciousness: awake, alert  and oriented  Airway and Oxygen Therapy: Patient Spontanous Breathing  Post-op Pain: mild  Post-op Assessment: Post-op Vital signs reviewed, Patient's Cardiovascular Status Stable, Respiratory Function Stable, Patent Airway, No signs of Nausea or vomiting and Pain level controlled  Post-op Vital Signs: Reviewed and stable  Complications: No apparent anesthesia complications

## 2012-03-23 NOTE — Op Note (Signed)
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 Appropriate timeout for taken at that point. The procedure started with the laparoscopic portion. The bladder was drained the subumbilical area was infiltrated with quarter percent Marcaine plain small incision was made in the varies needle was introduced without difficulty. Its intra-abdominal position was verified by pressure water testing. After a 33 L pneumoperitoneum was created, laparoscopic trocar and sleeve were then introduced without difficulty. 3 finger breaths above the symphysis in the midline a 5 mm trocar was inserted under direct visualization. The patient then placed in Trendelenburg and the pelvic findings as follows  The uterus is surgically absent the upper upper abdomen was unremarkable the left tube and ovary were densely adherent to the left pelvic sidewall 75% obscured by adhesions of the colon across the left pelvic sidewall, there were also adhesions surrounding the right tube and ovary which otherwise appeared to be normal as far size is concerned. The cul-de-sac was free and clear after these findings were photographed, and stress removed gas allowed to escape the because closed with 4-0 Vicryl subcuticular sutures. Attention directed to excision of the old Pfannenstiel scar which had formed keloid this was excised superficially in the lips Bovie was used for hemostasis a 4-0 Monocryl subcuticular suture was then used to reapproximate the skin edges dilute Kenalog and quarter percent Marcaine was then  used to inject the incision to try to reduce keloid formation.  The legs were extended at this point the CO2 laser microscope was brought into the field dilute acetic acid was used at the top and vagina colposcopy was carried out as noted previously there were some minimal acetowhite areas no unusual vascular pattern noted. The CO2 was on 20 W with a defocused beam used to superficially ablate the vaginal cuff and a brush technique. Estrace vaginal cream was then placed atop and vagina she tolerated this well went to recovery room in good condition.  Dictated with [facility]  [name] M. [name] M.D. and in in and  Surgeon: [name]  Anesthesia: Gen.  Specimens removed:

## 2012-03-23 NOTE — Anesthesia Preprocedure Evaluation (Signed)
Anesthesia Evaluation  Patient identified by MRN, date of birth, ID band Patient awake    Reviewed: Allergy & Precautions, H&P , NPO status , Patient's Chart, lab work & pertinent test results, reviewed documented beta blocker date and time   History of Anesthesia Complications Negative for: history of anesthetic complications  Airway Mallampati: II TM Distance: >3 FB Neck ROM: full    Dental  (+) Teeth Intact   Pulmonary neg pulmonary ROS,  breath sounds clear to auscultation  Pulmonary exam normal       Cardiovascular Exercise Tolerance: Good Rhythm:regular Rate:Normal     Neuro/Psych PSYCHIATRIC DISORDERS (depression/anxiety) Back pain - taking hydrocodone    GI/Hepatic Neg liver ROS, GERD-  Medicated,  Endo/Other  negative endocrine ROS  Renal/GU negative Renal ROS  Female GU complaint     Musculoskeletal   Abdominal   Peds  Hematology negative hematology ROS (+)   Anesthesia Other Findings   Reproductive/Obstetrics negative OB ROS                           Anesthesia Physical Anesthesia Plan  ASA: II  Anesthesia Plan: General ETT   Post-op Pain Management:    Induction:   Airway Management Planned:   Additional Equipment:   Intra-op Plan:   Post-operative Plan:   Informed Consent: I have reviewed the patients History and Physical, chart, labs and discussed the procedure including the risks, benefits and alternatives for the proposed anesthesia with the patient or authorized representative who has indicated his/her understanding and acceptance.   Dental Advisory Given  Plan Discussed with: CRNA and Surgeon  Anesthesia Plan Comments:         Anesthesia Quick Evaluation

## 2012-03-23 NOTE — Progress Notes (Signed)
The patient was re-examined with no change in status 

## 2012-03-23 NOTE — Transfer of Care (Signed)
Immediate Anesthesia Transfer of Care Note  Patient: [name]  Procedure(s) Performed: Procedure(s) (LRB): LAPAROSCOPY OPERATIVE (N/A) SCAR REVISION (N/A)  Patient Location: PACU  Anesthesia Type: General  Level of Consciousness: awake, alert  and oriented  Airway & Oxygen Therapy: Patient Spontanous Breathing and Patient connected to nasal cannula oxygen  Post-op Assessment: Report given to PACU RN and Post -op Vital signs reviewed and stable  Post vital signs: stable  Complications: No apparent anesthesia complications

## 2012-03-24 ENCOUNTER — Encounter (HOSPITAL_COMMUNITY): Payer: Self-pay | Admitting: Obstetrics and Gynecology

## 2012-04-27 ENCOUNTER — Encounter (HOSPITAL_COMMUNITY): Payer: Self-pay

## 2012-04-27 ENCOUNTER — Encounter (HOSPITAL_COMMUNITY)
Admission: RE | Admit: 2012-04-27 | Discharge: 2012-04-27 | Disposition: A | Discharge status: Home / Self Care | Payer: Managed Care, Other (non HMO) | Source: Ambulatory Visit | Attending: Obstetrics and Gynecology | Admitting: Obstetrics and Gynecology

## 2012-04-27 HISTORY — DX: Cough, unspecified: R05.9

## 2012-04-27 HISTORY — DX: Cough: R05

## 2012-04-27 HISTORY — DX: Anemia, unspecified: D64.9

## 2012-04-27 HISTORY — DX: Headache: R51

## 2012-04-27 LAB — SURGICAL PCR SCREEN
MRSA, PCR: NEGATIVE
Staphylococcus aureus: NEGATIVE

## 2012-04-27 LAB — CBC
MCH: 29.2 pg (ref 26.0–34.0)
MCHC: 31.7 g/dL (ref 30.0–36.0)
Platelets: 241 10*3/uL (ref 150–400)
RBC: 4.31 MIL/uL (ref 3.87–5.11)

## 2012-04-27 LAB — COMPREHENSIVE METABOLIC PANEL
ALT: 16 U/L (ref 0–35)
AST: 16 U/L (ref 0–37)
Alkaline Phosphatase: 51 U/L (ref 39–117)
Calcium: 9.2 mg/dL (ref 8.4–10.5)
Potassium: 3.7 mEq/L (ref 3.5–5.1)
Sodium: 137 mEq/L (ref 135–145)
Total Protein: 7.5 g/dL (ref 6.0–8.3)

## 2012-04-27 NOTE — Patient Instructions (Addendum)
   Your procedure is scheduled on: Monday September 16th  Enter through the Main Entrance of [facility] at: [employer] up the phone at the desk and dial [phone] and inform us of your arrival.  Please call this number if you have any problems the morning of surgery: [phone]  Remember: Do not eat or drink anything after midnight Sunday evening   Do not wear jewelry, make-up, or FINGER nail polish No metal in your hair or on your body. Do not wear lotions, powders, perfumes or deodorant. Do not shave 48 hours prior to surgery. Do not bring valuables to the hospital.   Leave suitcase in the car. After Surgery it may be brought to your room. For patients being admitted to the hospital, checkout time is 11:00am the day of discharge.      Remember to use your hibiclens as instructed.Please shower with 1/2 bottle the evening before your surgery and the other 1/2 bottle the morning of surgery. Neck down avoiding private area.

## 2012-04-27 NOTE — H&P (Signed)
[name]  DICTATION # [id number] CSN# [id number]   Meriel Pica, MD 04/27/2012 10:04 AM

## 2012-04-28 ENCOUNTER — Other Ambulatory Visit: Payer: Self-pay | Admitting: Internal Medicine

## 2012-04-28 ENCOUNTER — Encounter: Payer: Self-pay | Admitting: Internal Medicine

## 2012-04-28 ENCOUNTER — Ambulatory Visit (INDEPENDENT_AMBULATORY_CARE_PROVIDER_SITE_OTHER): Payer: Managed Care, Other (non HMO) | Admitting: Internal Medicine

## 2012-04-28 VITALS — BP 102/60 | HR 69 | Temp 97.5°F | Ht 67.0 in | Wt 161.6 lb

## 2012-04-28 DIAGNOSIS — Z0000 Encounter for general adult medical examination without abnormal findings: Secondary | ICD-10-CM

## 2012-04-28 DIAGNOSIS — Z01818 Encounter for other preprocedural examination: Secondary | ICD-10-CM | POA: Insufficient documentation

## 2012-04-28 DIAGNOSIS — F411 Generalized anxiety disorder: Secondary | ICD-10-CM

## 2012-04-28 DIAGNOSIS — J069 Acute upper respiratory infection, unspecified: Secondary | ICD-10-CM

## 2012-04-28 DIAGNOSIS — K120 Recurrent oral aphthae: Secondary | ICD-10-CM

## 2012-04-28 MED ORDER — TRIAMCINOLONE ACETONIDE 0.1 % MT PSTE: PASTE | Freq: Two times a day (BID) | OROMUCOSAL | Status: DC

## 2012-04-28 MED ORDER — HYDROCOD POLST-CHLORPHEN POLST 10-8 MG/5ML PO LQCR
5.0000 mL | Freq: Two times a day (BID) | ORAL | Status: DC | PRN
Start: 2012-04-28 — End: 2013-03-29

## 2012-04-28 MED ORDER — AZITHROMYCIN 250 MG PO TABS: ORAL_TABLET | ORAL | Status: AC

## 2012-04-28 MED ORDER — AZITHROMYCIN 250 MG PO TABS: ORAL_TABLET | ORAL | Status: DC

## 2012-04-28 MED ORDER — TRIAMCINOLONE ACETONIDE 0.1 % MT PSTE: PASTE | Freq: Two times a day (BID) | OROMUCOSAL | Status: AC

## 2012-04-28 NOTE — Assessment & Plan Note (Signed)
Ok for surgury as planned 

## 2012-04-28 NOTE — Addendum Note (Signed)
Addended by: Scharlene Gloss B on: 04/28/2012 01:18 PM   Modules accepted: Orders

## 2012-04-28 NOTE — Assessment & Plan Note (Signed)
Mild to mod, for kenalog in orabase asd,  to f/u any worsening symptoms or concerns

## 2012-04-28 NOTE — Patient Instructions (Addendum)
Take all new medications as prescribed Continue all other medications as before You are OK for surgury next Monday as planned - Sept 16 - Good Luck!

## 2012-04-28 NOTE — Assessment & Plan Note (Signed)
stable overall by hx and exam, most recent data reviewed with pt, and pt to continue medical treatment as before Lab Results  Component Value Date   WBC 6.4 04/27/2012   HGB 12.6 04/27/2012   HCT 39.7 04/27/2012   PLT 241 04/27/2012   GLUCOSE 98 04/27/2012   CHOL 183 11/19/2010   TRIG 32.0 11/19/2010   HDL 48.10 11/19/2010   LDLCALC 129* 11/19/2010   ALT 16 04/27/2012   AST 16 04/27/2012   NA 137 04/27/2012   K 3.7 04/27/2012   CL 102 04/27/2012   CREATININE 0.57 04/27/2012   BUN 9 04/27/2012   CO2 27 04/27/2012   TSH 0.71 06/22/2011

## 2012-04-28 NOTE — Telephone Encounter (Signed)
Pt given rx for tussionex earlier today  Does she need a change to the hycodan?

## 2012-04-28 NOTE — Progress Notes (Signed)
Subjective:    Patient ID: [name], female    DOB: July 10, 1971, 41 y.o.   MRN: [id number]  HPI   Here with 3 days acute onset fever, facial pain, pressure, general weakness and malaise, and greenish d/c, with slight ST, but little to no cough and Pt denies chest pain, increased sob or doe, wheezing, orthopnea, PND, increased LE swelling, palpitations, dizziness or syncope.,  Due for GYN/pelvic surgury sept 16.  Pt denies new neurological symptoms such as new headache, or facial or extremity weakness or numbness   Pt denies polydipsia, polyuria.  No other new complaints.  Has ongoing stressors, pain and husband with recent lung cancer diagnosis. Denies worsening depressive symptoms, suicidal ideation, or panic, though has ongoing anxiety Past Medical History  Diagnosis Date  . ANXIETY 08/04/2007  . DEPRESSION 08/04/2007  . SINUSITIS- ACUTE-NOS 07/25/2010  . ALLERGIC RHINITIS 08/04/2007  . GERD 08/04/2007  . FATIGUE 08/04/2007  . Abdominal pain, left lower quadrant 03/26/2008  . ABDOMINAL PAIN, SUPRAPUBIC 08/04/2007  . SVD (spontaneous vaginal delivery)     x 4  . Insomnia   . Headache   . Anemia   . Cough     instructed to see primary care if worsens-no fever   Past Surgical History  Procedure Date  . Wisdom tooth extraction   . Tubal ligation   . Rhinoplasty     nose  . Laparoscopy 03/23/2012    Procedure: LAPAROSCOPY OPERATIVE;  Surgeon: Meriel Pica, MD;  Location: [facility];  Service: Gynecology;  Laterality: N/A;  with CO2 laser ablation of Vin;need microscope,vaginal vault excision of keloid;will want kenolog 40 from pharmacy.  . Scar revision 03/23/2012    Procedure: SCAR REVISION;  Surgeon: Meriel Pica, MD;  Location: [facility];  Service: Gynecology;  Laterality: N/A;  . Abdominal hysterectomy 2004    reports that she has never smoked. She has never used smokeless tobacco. She reports that she does not drink alcohol or use illicit drugs. family history includes Asthma in  her mother. Allergies  Allergen Reactions  . Latex Shortness Of Breath, Swelling and Rash  . Other Itching and Nausea And Vomiting    Pain medication given after surgery; pt doesn't remember name.  Most likely opioid-derived.   Current Outpatient Prescriptions on File Prior to Visit  Medication Sig Dispense Refill  . hydrOXYzine (ATARAX/VISTARIL) 25 MG tablet Take 25 mg by mouth every 6 (six) hours as needed. Takes for itching      . Multiple Vitamin (MULTIVITAMIN WITH MINERALS) TABS Take 1 tablet by mouth daily.      Marland Kitchen oxyCODONE-acetaminophen (ROXICET) 5-325 MG per tablet 1-2 PO q 6 hrs prn pain  30 tablet  0  . zolpidem (AMBIEN) 5 MG tablet Take 5 mg by mouth at bedtime as needed. For sleep       Review of Systems Review of Systems  Constitutional: Negative for diaphoresis and unexpected weight change.  HENT: Negative for drooling and tinnitus.   Eyes: Negative for photophobia and visual disturbance.  Respiratory: Negative for choking and stridor.   Gastrointestinal: Negative for vomiting and blood in stool.  Genitourinary: Negative for hematuria and decreased urine volume.  Musculoskeletal: Negative for gait problem.  Skin: Negative for color change and wound.  Neurological: Negative for tremors and numbness.  Psychiatric/Behavioral: Negative for decreased concentration. The patient is not hyperactive.       Objective:   Physical Exam BP 102/60  Pulse 69  Temp 97.5 F (36.4 C) (  Oral)  Ht 5\' 7"  (1.702 m)  Wt 161 lb 9 oz (73.284 kg)  BMI 25.30 kg/m2  SpO2 98% Physical Exam  VS noted Constitutional: Pt appears well-developed and well-nourished.  HENT: Head: Normocephalic.  Right Ear: External ear normal.  Left Ear: External ear normal.  Bilat tm's mild erythema.  Sinus tender bilat.  Pharynx mild erythema apthous tender ulceration noted midline lower gum at the frenulum Eyes: Conjunctivae and EOM are normal. Pupils are equal, round, and reactive to light.  Neck: Normal  range of motion. Neck supple.  Cardiovascular: Normal rate and regular rhythm.   Pulmonary/Chest: Effort normal and breath sounds normal.  Abd:  Soft, NT, non-distended, + BS Neurological: Pt is alert. No cranial nerve deficit. Motor/dtr/gait intact  Skin: Skin is warm. No erythema.  Psychiatric: Pt behavior is normal. Thought content normal. 1+ nervous    Assessment & Plan:

## 2012-04-28 NOTE — Assessment & Plan Note (Signed)
Mild to mod, for antibx course,  to f/u any worsening symptoms or concerns 

## 2012-04-29 NOTE — Telephone Encounter (Signed)
Faxed hardcopy to pharmacy. 

## 2012-04-29 NOTE — H&P (Signed)
[name], [name]                    ACCOUNT NO.:  [id number]  MEDICAL RECORD NO.:  [id number]  LOCATION:                                 FACILITY:  PHYSICIAN:  Duke Salvia. Marcelle Overlie, M.D.DATE OF BIRTH:  [birth year]  DATE OF ADMISSION:  05/02/2012 DATE OF DISCHARGE:                             HISTORY & PHYSICAL   CHIEF COMPLAINT:  Pelvic pain, adnexal adhesions.  HISTORY OF PRESENT ILLNESS:  A 41 year old, G6, P4, the patient underwent TAH for fibroids in 2009.  Ovaries were normal at that time. In the preceding years, she has experienced worsening left lower quadrant pain, and has also been followed for abnormal vaginal cuff Pap smear that showed persistent mild dysplasia, verified by biopsy of March 24, 2012.  The patient underwent diagnostic laparoscopy, revision of her old superficial scar and laser ablation of VAIN1.  FINDINGS:  At the time of laparoscopy showed that the upper abdomen was unremarkable.  Uterus was surgically absent.  Left tube and ovary were densely adherent to the left pelvic sidewall.  75% obscured by adhesions.  The cul-de-sac was free and clear, with some filmy adhesions surrounding the right ovary.  Due to continued problems with pain, she presents now for laparotomy with BSO, lysis of adhesions after appropriate bowel prep.  This procedure including risks related to bleeding infection, adjacent organ injury, along with her expected recovery time discussed which she understands and accepts.  Also reviewed the possibility of requiring ERT, wound infection, phlebitis, time off work, etc.  PAST HISTORY MEDICATIONS:  Allergies none.  CURRENT MEDICATIONS:  Percocet p.r.n.  Ambien p.r.n.  SURGICAL HISTORY:  Hysterectomy in 2009, breast biopsy for benign disease in 2012, diagnostic laparoscopy with laser ablation of the VAIN1.  Other pertinent medical history negative.  SOCIAL HISTORY:  Denies alcohol, tobacco, or drug use.  She is married.  PHYSICAL  EXAMINATION:  VITAL SIGNS:  Temp 98.2,  blood pressure 120/78. HEENT:  Unremarkable.  NECK:  Supple without masses. LUNGS:  Clear.  CARDIOVASCULAR:  Regular rate and rhythm without murmurs, rubs, gallops.  Breasts without masses.  ABDOMEN:  Soft, flat, and  nontender. PELVIC:  Normal external genitalia.  Vaginal cuff was clear.  Bimanual revealed tenderness on the left side.  No pain.  No definite masses noted. EXTREMITIES AND NEUROLOGICAL:  Unremarkable.  IMPRESSION:  Pelvic pain, dense left adnexal and right adnexal adhesions.  PLAN:  Laparotomy with BSO, bowel prep, procedure and risks discussed as above.     Anne Boltz M. Marcelle Overlie, M.D.     [facility]/MEDQ  D:  04/27/2012  T:  04/28/2012  Job:  [id number]

## 2012-04-29 NOTE — Telephone Encounter (Signed)
Done hardcopy to robin  

## 2012-05-02 ENCOUNTER — Encounter (HOSPITAL_COMMUNITY): Payer: Self-pay

## 2012-05-02 ENCOUNTER — Inpatient Hospital Stay (HOSPITAL_COMMUNITY)
Admission: RE | Admit: 2012-05-02 | Discharge: 2012-05-04 | DRG: 743 | Disposition: A | Discharge status: Home / Self Care | Payer: Managed Care, Other (non HMO) | Source: Ambulatory Visit | Attending: Obstetrics and Gynecology | Admitting: Obstetrics and Gynecology

## 2012-05-02 ENCOUNTER — Inpatient Hospital Stay (HOSPITAL_COMMUNITY): Payer: Managed Care, Other (non HMO) | Admitting: Anesthesiology

## 2012-05-02 ENCOUNTER — Encounter (HOSPITAL_COMMUNITY)
Admission: RE | Disposition: A | Discharge status: Home / Self Care | Payer: Self-pay | Source: Ambulatory Visit | Attending: Obstetrics and Gynecology

## 2012-05-02 ENCOUNTER — Encounter (HOSPITAL_COMMUNITY): Payer: Self-pay | Admitting: Anesthesiology

## 2012-05-02 DIAGNOSIS — R102 Pelvic and perineal pain: Secondary | ICD-10-CM

## 2012-05-02 DIAGNOSIS — N831 Corpus luteum cyst of ovary, unspecified side: Secondary | ICD-10-CM | POA: Diagnosis present

## 2012-05-02 DIAGNOSIS — N949 Unspecified condition associated with female genital organs and menstrual cycle: Principal | ICD-10-CM | POA: Diagnosis present

## 2012-05-02 DIAGNOSIS — Z90710 Acquired absence of both cervix and uterus: Secondary | ICD-10-CM

## 2012-05-02 DIAGNOSIS — Z01818 Encounter for other preprocedural examination: Secondary | ICD-10-CM

## 2012-05-02 DIAGNOSIS — Z01812 Encounter for preprocedural laboratory examination: Secondary | ICD-10-CM

## 2012-05-02 DIAGNOSIS — N9489 Other specified conditions associated with female genital organs and menstrual cycle: Secondary | ICD-10-CM | POA: Diagnosis present

## 2012-05-02 HISTORY — PX: LAPAROTOMY: SHX154

## 2012-05-02 HISTORY — PX: SALPINGOOPHORECTOMY: SHX82

## 2012-05-02 SURGERY — LAPAROTOMY, EXPLORATORY
Anesthesia: General | Site: Abdomen | Wound class: Clean Contaminated

## 2012-05-02 MED ORDER — GLYCOPYRROLATE 0.2 MG/ML IJ SOLN
INTRAMUSCULAR | Status: DC | PRN
  Administered 2012-05-02: 0.1 mg via INTRAVENOUS
  Administered 2012-05-02: .8 mg via INTRAVENOUS

## 2012-05-02 MED ORDER — LIDOCAINE HCL 0.5 % IJ SOLN
INTRAMUSCULAR | Status: AC
  Filled 2012-05-02: qty 2

## 2012-05-02 MED ORDER — MENTHOL 3 MG MT LOZG: 1.0000 | LOZENGE | OROMUCOSAL | Status: DC | PRN

## 2012-05-02 MED ORDER — KETOROLAC TROMETHAMINE 30 MG/ML IJ SOLN: 15.0000 mg | Freq: Once | INTRAMUSCULAR | Status: DC | PRN

## 2012-05-02 MED ORDER — DEXAMETHASONE SODIUM PHOSPHATE 10 MG/ML IJ SOLN
INTRAMUSCULAR | Status: AC
  Filled 2012-05-02: qty 1

## 2012-05-02 MED ORDER — ONDANSETRON HCL 4 MG PO TABS: 4.0000 mg | ORAL_TABLET | Freq: Four times a day (QID) | ORAL | Status: DC | PRN

## 2012-05-02 MED ORDER — FENTANYL CITRATE 0.05 MG/ML IJ SOLN
INTRAMUSCULAR | Status: AC
  Administered 2012-05-02: 50 ug via INTRAVENOUS
  Filled 2012-05-02: qty 2

## 2012-05-02 MED ORDER — BUPIVACAINE HCL (PF) 0.5 % IJ SOLN
INTRAMUSCULAR | Status: AC
  Filled 2012-05-02: qty 30

## 2012-05-02 MED ORDER — DEXAMETHASONE SODIUM PHOSPHATE 10 MG/ML IJ SOLN
INTRAMUSCULAR | Status: DC | PRN
  Administered 2012-05-02: 10 mg via INTRAVENOUS

## 2012-05-02 MED ORDER — MORPHINE SULFATE (PF) 1 MG/ML IV SOLN
INTRAVENOUS | Status: DC
  Administered 2012-05-02: 7 mg via INTRAVENOUS
  Administered 2012-05-02: 12:00:00 via INTRAVENOUS
  Filled 2012-05-02: qty 25

## 2012-05-02 MED ORDER — DIPHENHYDRAMINE HCL 50 MG/ML IJ SOLN
12.5000 mg | Freq: Four times a day (QID) | INTRAMUSCULAR | Status: DC | PRN
  Administered 2012-05-02: 12.5 mg via INTRAVENOUS
  Filled 2012-05-02: qty 1

## 2012-05-02 MED ORDER — IBUPROFEN 800 MG PO TABS
800.0000 mg | ORAL_TABLET | Freq: Three times a day (TID) | ORAL | Status: DC | PRN
  Administered 2012-05-03 – 2012-05-04 (×3): 800 mg via ORAL
  Filled 2012-05-02 (×3): qty 1

## 2012-05-02 MED ORDER — GLYCOPYRROLATE 0.2 MG/ML IJ SOLN
INTRAMUSCULAR | Status: AC
  Filled 2012-05-02: qty 2

## 2012-05-02 MED ORDER — KETOROLAC TROMETHAMINE 30 MG/ML IJ SOLN
30.0000 mg | Freq: Four times a day (QID) | INTRAMUSCULAR | Status: DC
  Filled 2012-05-02: qty 1

## 2012-05-02 MED ORDER — BUTORPHANOL TARTRATE 1 MG/ML IJ SOLN: 1.0000 mg | INTRAMUSCULAR | Status: DC | PRN

## 2012-05-02 MED ORDER — ONDANSETRON HCL 4 MG/2ML IJ SOLN: 4.0000 mg | Freq: Four times a day (QID) | INTRAMUSCULAR | Status: DC | PRN

## 2012-05-02 MED ORDER — LIDOCAINE HCL (CARDIAC) 20 MG/ML IV SOLN
INTRAVENOUS | Status: AC
  Filled 2012-05-02: qty 5

## 2012-05-02 MED ORDER — ROCURONIUM BROMIDE 50 MG/5ML IV SOLN
INTRAVENOUS | Status: AC
  Filled 2012-05-02: qty 1

## 2012-05-02 MED ORDER — PROPOFOL 10 MG/ML IV EMUL
INTRAVENOUS | Status: AC
  Filled 2012-05-02: qty 20

## 2012-05-02 MED ORDER — BUPIVACAINE HCL (PF) 0.5 % IJ SOLN
INTRAMUSCULAR | Status: DC | PRN
  Administered 2012-05-02: 270 mL

## 2012-05-02 MED ORDER — NALOXONE HCL 0.4 MG/ML IJ SOLN: 0.4000 mg | INTRAMUSCULAR | Status: DC | PRN

## 2012-05-02 MED ORDER — OXYCODONE-ACETAMINOPHEN 5-325 MG PO TABS
1.0000 | ORAL_TABLET | ORAL | Status: DC | PRN
  Administered 2012-05-03 – 2012-05-04 (×2): 1 via ORAL
  Filled 2012-05-02 (×2): qty 1

## 2012-05-02 MED ORDER — LIDOCAINE HCL 1 % IJ SOLN
INTRAMUSCULAR | Status: DC | PRN
  Administered 2012-05-02: 20 mL

## 2012-05-02 MED ORDER — TRIAMCINOLONE ACETONIDE 40 MG/ML IJ SUSP
INTRAMUSCULAR | Status: DC | PRN
  Administered 2012-05-02: 40 mg

## 2012-05-02 MED ORDER — KETOROLAC TROMETHAMINE 30 MG/ML IJ SOLN
INTRAMUSCULAR | Status: AC
  Administered 2012-05-02: 30 mg via INTRAVENOUS
  Filled 2012-05-02: qty 1

## 2012-05-02 MED ORDER — DEXTROSE IN LACTATED RINGERS 5 % IV SOLN
INTRAVENOUS | Status: DC
  Administered 2012-05-02 – 2012-05-03 (×3): via INTRAVENOUS

## 2012-05-02 MED ORDER — BUPIVACAINE HCL (PF) 0.5 % IJ SOLN
INTRAMUSCULAR | Status: AC
  Filled 2012-05-02: qty 270

## 2012-05-02 MED ORDER — ONDANSETRON HCL 4 MG/2ML IJ SOLN
4.0000 mg | Freq: Four times a day (QID) | INTRAMUSCULAR | Status: DC | PRN
  Administered 2012-05-02: 4 mg via INTRAVENOUS
  Filled 2012-05-02: qty 2

## 2012-05-02 MED ORDER — PROMETHAZINE HCL 25 MG/ML IJ SOLN: 6.2500 mg | INTRAMUSCULAR | Status: DC | PRN

## 2012-05-02 MED ORDER — FENTANYL CITRATE 0.05 MG/ML IJ SOLN
25.0000 ug | INTRAMUSCULAR | Status: DC | PRN
  Administered 2012-05-02: 25 ug via INTRAVENOUS
  Administered 2012-05-02: 50 ug via INTRAVENOUS

## 2012-05-02 MED ORDER — TRIAMCINOLONE ACETONIDE 40 MG/ML IJ SUSP
Freq: Once | INTRAMUSCULAR | Status: DC
  Filled 2012-05-02 (×2): qty 1

## 2012-05-02 MED ORDER — ONDANSETRON HCL 4 MG/2ML IJ SOLN
INTRAMUSCULAR | Status: AC
  Filled 2012-05-02: qty 2

## 2012-05-02 MED ORDER — SODIUM CHLORIDE 0.9 % IJ SOLN: 9.0000 mL | INTRAMUSCULAR | Status: DC | PRN

## 2012-05-02 MED ORDER — MIDAZOLAM HCL 2 MG/2ML IJ SOLN
INTRAMUSCULAR | Status: AC
  Filled 2012-05-02: qty 2

## 2012-05-02 MED ORDER — FENTANYL CITRATE 0.05 MG/ML IJ SOLN
INTRAMUSCULAR | Status: DC | PRN
  Administered 2012-05-02 (×2): 50 ug via INTRAVENOUS
  Administered 2012-05-02: 100 ug via INTRAVENOUS
  Administered 2012-05-02: 50 ug via INTRAVENOUS

## 2012-05-02 MED ORDER — ZOLPIDEM TARTRATE 5 MG PO TABS: 5.0000 mg | ORAL_TABLET | Freq: Every evening | ORAL | Status: DC | PRN

## 2012-05-02 MED ORDER — ROCURONIUM BROMIDE 100 MG/10ML IV SOLN
INTRAVENOUS | Status: DC | PRN
  Administered 2012-05-02: 30 mg via INTRAVENOUS
  Administered 2012-05-02: 5 mg via INTRAVENOUS

## 2012-05-02 MED ORDER — HYDROXYZINE HCL 25 MG PO TABS
25.0000 mg | ORAL_TABLET | Freq: Four times a day (QID) | ORAL | Status: DC | PRN
  Administered 2012-05-03 (×2): 25 mg via ORAL
  Filled 2012-05-02: qty 1

## 2012-05-02 MED ORDER — FENTANYL CITRATE 0.05 MG/ML IJ SOLN
INTRAMUSCULAR | Status: AC
  Filled 2012-05-02: qty 5

## 2012-05-02 MED ORDER — INFLUENZA VIRUS VACC SPLIT PF IM SUSP
0.5000 mL | INTRAMUSCULAR | Status: AC
  Administered 2012-05-03: 0.5 mL via INTRAMUSCULAR
  Filled 2012-05-02: qty 0.5

## 2012-05-02 MED ORDER — PROPOFOL 10 MG/ML IV BOLUS
INTRAVENOUS | Status: DC | PRN
  Administered 2012-05-02: 10 mg via INTRAVENOUS

## 2012-05-02 MED ORDER — MIDAZOLAM HCL 2 MG/2ML IJ SOLN: 0.5000 mg | Freq: Once | INTRAMUSCULAR | Status: DC | PRN

## 2012-05-02 MED ORDER — LACTATED RINGERS IV SOLN
INTRAVENOUS | Status: DC
  Administered 2012-05-02 (×3): via INTRAVENOUS

## 2012-05-02 MED ORDER — KETOROLAC TROMETHAMINE 30 MG/ML IJ SOLN
30.0000 mg | Freq: Once | INTRAMUSCULAR | Status: AC
  Administered 2012-05-02: 30 mg via INTRAVENOUS

## 2012-05-02 MED ORDER — NEOSTIGMINE METHYLSULFATE 1 MG/ML IJ SOLN
INTRAMUSCULAR | Status: DC | PRN
  Administered 2012-05-02: 4 mg via INTRAVENOUS

## 2012-05-02 MED ORDER — LIDOCAINE HCL (CARDIAC) 20 MG/ML IV SOLN
INTRAVENOUS | Status: DC | PRN
  Administered 2012-05-02: 50 mg via INTRAVENOUS

## 2012-05-02 MED ORDER — DIPHENHYDRAMINE HCL 12.5 MG/5ML PO ELIX
12.5000 mg | ORAL_SOLUTION | Freq: Four times a day (QID) | ORAL | Status: DC | PRN
  Administered 2012-05-03: 12.5 mg via ORAL
  Filled 2012-05-02: qty 5

## 2012-05-02 MED ORDER — DEXTROSE IN LACTATED RINGERS 5 % IV SOLN: INTRAVENOUS | Status: DC

## 2012-05-02 MED ORDER — SUCCINYLCHOLINE CHLORIDE 20 MG/ML IJ SOLN
INTRAMUSCULAR | Status: AC
  Filled 2012-05-02: qty 10

## 2012-05-02 MED ORDER — KETOROLAC TROMETHAMINE 30 MG/ML IJ SOLN
30.0000 mg | Freq: Four times a day (QID) | INTRAMUSCULAR | Status: DC
  Administered 2012-05-02 – 2012-05-03 (×3): 30 mg via INTRAVENOUS
  Filled 2012-05-02 (×2): qty 1

## 2012-05-02 MED ORDER — ACETAMINOPHEN 10 MG/ML IV SOLN
1000.0000 mg | Freq: Once | INTRAVENOUS | Status: DC
  Filled 2012-05-02: qty 100

## 2012-05-02 MED ORDER — CEFAZOLIN SODIUM-DEXTROSE 2-3 GM-% IV SOLR
INTRAVENOUS | Status: AC
  Filled 2012-05-02: qty 50

## 2012-05-02 MED ORDER — ONDANSETRON HCL 4 MG/2ML IJ SOLN
INTRAMUSCULAR | Status: DC | PRN
  Administered 2012-05-02: 4 mg via INTRAVENOUS

## 2012-05-02 MED ORDER — CEFAZOLIN SODIUM-DEXTROSE 2-3 GM-% IV SOLR
2.0000 g | INTRAVENOUS | Status: AC
  Administered 2012-05-02: 2 g via INTRAVENOUS

## 2012-05-02 MED ORDER — MIDAZOLAM HCL 5 MG/5ML IJ SOLN
INTRAMUSCULAR | Status: DC | PRN
  Administered 2012-05-02: 2 mg via INTRAVENOUS

## 2012-05-02 MED ORDER — NEOSTIGMINE METHYLSULFATE 1 MG/ML IJ SOLN
INTRAMUSCULAR | Status: AC
  Filled 2012-05-02: qty 10

## 2012-05-02 MED ORDER — TRIAMCINOLONE ACETONIDE 40 MG/ML IJ SUSP: 1.0000 mL | Freq: Once | INTRAMUSCULAR | Status: DC

## 2012-05-02 MED ORDER — MEPERIDINE HCL 25 MG/ML IJ SOLN: 6.2500 mg | INTRAMUSCULAR | Status: DC | PRN

## 2012-05-02 MED ORDER — ACETAMINOPHEN 10 MG/ML IV SOLN
INTRAVENOUS | Status: AC
  Administered 2012-05-02: 1000 mg via INTRAVENOUS
  Filled 2012-05-02: qty 100

## 2012-05-02 SURGICAL SUPPLY — 33 items
ADH SKN CLS APL DERMABOND .7 (GAUZE/BANDAGES/DRESSINGS) ×2
CANISTER SUCTION 2500CC (MISCELLANEOUS) ×3 IMPLANT
CATH KIT ON Q 5IN DUAL SLV (PAIN MANAGEMENT) ×1 IMPLANT
CLOTH BEACON ORANGE TIMEOUT ST (SAFETY) ×3 IMPLANT
CONTAINER PREFILL 10% NBF 60ML (FORM) IMPLANT
DECANTER SPIKE VIAL GLASS SM (MISCELLANEOUS) IMPLANT
DERMABOND ADVANCED (GAUZE/BANDAGES/DRESSINGS) ×1
DERMABOND ADVANCED .7 DNX12 (GAUZE/BANDAGES/DRESSINGS) IMPLANT
DRESSING TELFA 8X3 (GAUZE/BANDAGES/DRESSINGS) ×3 IMPLANT
DRSG COVADERM 4X10 (GAUZE/BANDAGES/DRESSINGS) ×1 IMPLANT
DRSG TEGADERM 2.38X2.75 (GAUZE/BANDAGES/DRESSINGS) IMPLANT
GAUZE SPONGE 4X4 12PLY STRL LF (GAUZE/BANDAGES/DRESSINGS) ×5 IMPLANT
GLOVE BIO SURGEON STRL SZ7 (GLOVE) ×6 IMPLANT
GOWN PREVENTION PLUS LG XLONG (DISPOSABLE) ×9 IMPLANT
NDL HYPO 25X1 1.5 SAFETY (NEEDLE) IMPLANT
NEEDLE HYPO 25X1 1.5 SAFETY (NEEDLE) IMPLANT
NS IRRIG 1000ML POUR BTL (IV SOLUTION) ×3 IMPLANT
PACK ABDOMINAL GYN (CUSTOM PROCEDURE TRAY) ×3 IMPLANT
PAD OB MATERNITY 4.3X12.25 (PERSONAL CARE ITEMS) ×3 IMPLANT
SPONGE LAP 18X18 X RAY DECT (DISPOSABLE) ×3 IMPLANT
STRIP CLOSURE SKIN 1/4X4 (GAUZE/BANDAGES/DRESSINGS) ×3 IMPLANT
SUT CHROMIC 0 CT 1 (SUTURE) ×3 IMPLANT
SUT MON AB 4-0 PS1 27 (SUTURE) ×3 IMPLANT
SUT PDS AB 0 CT1 27 (SUTURE) ×6 IMPLANT
SUT VIC AB 0 CT1 18XCR BRD8 (SUTURE) ×2 IMPLANT
SUT VIC AB 0 CT1 8-18 (SUTURE) ×6
SUT VIC AB 3-0 CT1 27 (SUTURE) ×3
SUT VIC AB 3-0 CT1 TAPERPNT 27 (SUTURE) ×2 IMPLANT
SUT VICRYL 0 TIES 12 18 (SUTURE) ×3 IMPLANT
SYR CONTROL 10ML LL (SYRINGE) IMPLANT
TOWEL OR 17X24 6PK STRL BLUE (TOWEL DISPOSABLE) ×6 IMPLANT
TRAY FOLEY CATH 14FR (SET/KITS/TRAYS/PACK) ×3 IMPLANT
WATER STERILE IRR 1000ML POUR (IV SOLUTION) ×3 IMPLANT

## 2012-05-02 NOTE — Op Note (Signed)
Preoperative diagnosis: Bilateral adnexal adhesions, pelvic pain  Postoperative diagnosis: Same  Procedure: Laparotomy, lysis of adhesions, BSO  Surgeon: Marcelle Overlie  Assistant: McComb  Anesthesia: Gen.  EBL: 100 cc  Specimens removed: Bilateral tubes and ovaries, to pathology  Procedure and findings:  The patient taken the operating room after an adequate level of general anesthesia was obtained, appropriate timeout for taken. The abdomen prepped and draped in the usual fashion for laparotomy, Foley catheter positioned draining clear urine. Penicillus is was made tibial transverse scar which is carried down to the fascia rectus muscle divided in the midline, peritoneum entered superiorly without incident and extended in a vertical fashion. Initial inspection revealed some omental adhesions into the bladder flap area these were lysed in an avascular plane this allowed placement of a O'Connor-O'Sullivan retractor, patient placed in Trendelenburg and the bowel Speck superiorly out of the field  The left ovary was densely adherent to the left pelvic sidewall there were some epiploic K. adhesions from the sigmoid colon covering this area which were lysed in an avascular plane. Once this was freed up the left tube and ovary could be visualized. The rectoperineal space on that side laterally was developed, dissection was carried out to identify the ureter well below, the left IP ligament was then clamped divided first free tie followed by suture ligature of Vicryl. The remainder of the left tube and ovary were dissected with a clamp cut tie suture technique. Reinspection of the course of the ureter revealed it was well below the operative site.  On the right right tube and ovary were placed on traction with a Babcock toward the midline there were some minimal adnexal adhesions which were freed up. In similar manner the rectoperineal space on the right was developed, the ureter could be seen well below the  operative site, right IP ligament was then clamped divided first free tie followed by suture ligature of Vicryl remainder the right tube and every were dissected free. Pelvis is irrigated with saline and aspirated the course of the ureter on each side was inspected and noted to be below our operative site. Prior to closure sponge denies precast approach correct x2 the appendix and upper abdomen were unremarkable. Peritoneum closed with a 3-0 Vicryl suture 3-0 Vicryl tissues close the rectus muscles in the midline 0 PDS was then used to close the fascia from laterally to midline on either side.  A separate six-inch needle introducer was then used to introduce and On-Q catheter first into the subfascial space and through a second puncture incision into the subcutaneous space. Subcutaneous tissue was hemostatic 4-0 Monocryl subcuticular suture with a sterile dressing applied the On-Q catheters were affixed under Tegaderm dressings and PROM at each with 1% Xylocaine 4 cc at each site in and hooked up to the On-Q pump. Clear urine noted in the case went to recovery room in good condition.  Dictated with [facility]  [name] M. [name].D.

## 2012-05-02 NOTE — Progress Notes (Signed)
The patient was re-examined with no change in status 

## 2012-05-02 NOTE — Transfer of Care (Signed)
Immediate Anesthesia Transfer of Care Note  Patient: [name]  Procedure(s) Performed: Procedure(s) (LRB) with comments: EXPLORATORY LAPAROTOMY (N/A) SALPINGO OOPHERECTOMY (Bilateral) - bilateral  Patient Location: PACU  Anesthesia Type: General  Level of Consciousness: awake, alert  and oriented  Airway & Oxygen Therapy: Patient Spontanous Breathing and Patient connected to nasal cannula oxygen  Post-op Assessment: Report given to PACU RN and Post -op Vital signs reviewed and stable  Post vital signs: stable  Complications: No apparent anesthesia complications

## 2012-05-02 NOTE — Anesthesia Postprocedure Evaluation (Signed)
  Anesthesia Post-op Note  Patient: [name]  Procedure(s) Performed: Procedure(s) (LRB) with comments: EXPLORATORY LAPAROTOMY (N/A) SALPINGO OOPHERECTOMY (Bilateral) - bilateral  Patient Location: PACU and Women's Unit  Anesthesia Type: General  Level of Consciousness: awake, alert  and oriented  Airway and Oxygen Therapy: Patient Spontanous Breathing  Post-op Pain: mild  Post-op Assessment: Patient's Cardiovascular Status Stable, Respiratory Function Stable and NAUSEA AND VOMITING PRESENT. Patient nauseated and vomited for first time. RN giving medication for nausea and toradol so morphine PCA use will be less. Suggested asking surgeon for dilaudid PCA if needed  Post-op Vital Signs: stable  Complications: No apparent anesthesia complications

## 2012-05-02 NOTE — Anesthesia Postprocedure Evaluation (Signed)
  Anesthesia Post-op Note  Patient: [name]  Procedure(s) Performed: Procedure(s) (LRB) with comments: EXPLORATORY LAPAROTOMY (N/A) SALPINGO OOPHERECTOMY (Bilateral) - bilateral Patient is awake and responsive. Pain and nausea are reasonably well controlled. Vital signs are stable and clinically acceptable. Oxygen saturation is clinically acceptable. There are no apparent anesthetic complications at this time. Patient is ready for discharge.

## 2012-05-02 NOTE — Anesthesia Preprocedure Evaluation (Signed)
Anesthesia Evaluation  Patient identified by MRN, date of birth, ID band Patient awake    Reviewed: Allergy & Precautions, H&P , Patient's Chart, lab work & pertinent test results, reviewed documented beta blocker date and time   History of Anesthesia Complications Negative for: history of anesthetic complications  Airway Mallampati: II TM Distance: >3 FB Neck ROM: full    Dental No notable dental hx.    Pulmonary neg pulmonary ROS,  breath sounds clear to auscultation  Pulmonary exam normal       Cardiovascular Exercise Tolerance: Good negative cardio ROS  Rhythm:regular Rate:Normal     Neuro/Psych  Headaches, PSYCHIATRIC DISORDERS negative neurological ROS  negative psych ROS   GI/Hepatic negative GI ROS, Neg liver ROS, GERD-  Controlled,  Endo/Other  negative endocrine ROS  Renal/GU negative Renal ROS     Musculoskeletal   Abdominal   Peds  Hematology negative hematology ROS (+)   Anesthesia Other Findings ANXIETY 08/04/2007   DEPRESSION 08/04/2007      SINUSITIS- ACUTE-NOS 07/25/2010   ALLERGIC RHINITIS 08/04/2007      GERD 08/04/2007   FATIGUE 08/04/2007      Abdominal pain, left lower quadrant 03/26/2008   ABDOMINAL PAIN, SUPRAPUBIC 08/04/2007      SVD (spontaneous vaginal delivery)   x 4 Insomnia        Headache     Anemia        Cough   instructed to see primary care if worsens-no fever    Reproductive/Obstetrics negative OB ROS                           Anesthesia Physical Anesthesia Plan  ASA: II  Anesthesia Plan: General ETT   Post-op Pain Management:    Induction:   Airway Management Planned:   Additional Equipment:   Intra-op Plan:   Post-operative Plan:   Informed Consent: I have reviewed the patients History and Physical, chart, labs and discussed the procedure including the risks, benefits and alternatives for the proposed anesthesia with the patient or  authorized representative who has indicated his/her understanding and acceptance.   Dental Advisory Given  Plan Discussed with: CRNA and Surgeon  Anesthesia Plan Comments:         Anesthesia Quick Evaluation

## 2012-05-02 NOTE — Addendum Note (Signed)
Addendum  created 05/02/12 1636 by Elbert Ewings, CRNA   Modules edited:Notes Section

## 2012-05-02 NOTE — Anesthesia Procedure Notes (Signed)
Procedure Name: Intubation Date/Time: 05/02/2012 7:28 AM Performed by: Graciela Husbands Pre-anesthesia Checklist: Suction available, Emergency Drugs available, Timeout performed, Patient identified and Patient being monitored Patient Re-evaluated:Patient Re-evaluated prior to inductionOxygen Delivery Method: Circle system utilized Preoxygenation: Pre-oxygenation with 100% oxygen Intubation Type: IV induction Ventilation: Mask ventilation without difficulty Laryngoscope Size: Mac and 3 Grade View: Grade I Tube size: 7.0 mm Number of attempts: 1 Airway Equipment and Method: Stylet Secured at: 20 cm Tube secured with: Tape Dental Injury: Teeth and Oropharynx as per pre-operative assessment

## 2012-05-03 ENCOUNTER — Encounter (HOSPITAL_COMMUNITY): Payer: Self-pay | Admitting: Obstetrics and Gynecology

## 2012-05-03 LAB — CBC
Hemoglobin: 11.3 g/dL — ABNORMAL LOW (ref 12.0–15.0)
MCHC: 32.6 g/dL (ref 30.0–36.0)
WBC: 10.7 10*3/uL — ABNORMAL HIGH (ref 4.0–10.5)

## 2012-05-03 NOTE — Progress Notes (Signed)
1 Day Post-Op Procedure(s) (LRB): EXPLORATORY LAPAROTOMY (N/A) SALPINGO OOPHERECTOMY (Bilateral)  Subjective: Patient reports tolerating PO.    Objective: I have reviewed patient's vital signs, intake and output and labs. BP 98/71  Pulse 83  Temp 97.4 F (36.3 C) (Oral)  Resp 16  Ht 5\' 7"  (1.702 m)  Wt 161 lb (73.029 kg)  BMI 25.22 kg/m2  SpO2 97%  Inc C/D, abd soft + BS  Assessment: s/p Procedure(s) (LRB) with comments: EXPLORATORY LAPAROTOMY (N/A) SALPINGO OOPHERECTOMY (Bilateral) - bilateral: progressing well  Plan: Advance diet Encourage ambulation Advance to PO medication Discontinue IV fluids  LOS: 1 day    Jery Hollern M 05/03/2012, 8:02 AM

## 2012-05-04 DIAGNOSIS — R102 Pelvic and perineal pain: Secondary | ICD-10-CM

## 2012-05-04 MED ORDER — OXYCODONE-ACETAMINOPHEN 5-325 MG PO TABS: 1.0000 | ORAL_TABLET | Freq: Four times a day (QID) | ORAL | Status: DC | PRN

## 2012-05-04 MED ORDER — IBUPROFEN 800 MG PO TABS: 800.0000 mg | ORAL_TABLET | Freq: Three times a day (TID) | ORAL | Status: DC | PRN

## 2012-05-04 NOTE — Progress Notes (Signed)
Ur chart review completed.  

## 2012-05-04 NOTE — Discharge Summary (Signed)
Physician Discharge Summary  Patient ID: ANDREIA ELEAZER MRN: [id number] DOB/AGE: May 05, 1971 41 y.o.  Admit date: 05/02/2012 Discharge date: 05/04/2012  Admission Diagnoses:  Discharge Diagnoses:  Active Problems:  Pelvic pain in female   Discharged Condition: good  Hospital Course: adm for EL with LOA and BSO, diet incr on POD #1, afeb, cath removed, DC on POD #2, afeb, tol PO, abd soft +BS, INC C/D  Consults: None  Significant Diagnostic Studies: labs:  CBC    Component Value Date/Time   WBC 10.7* 05/03/2012 0835   RBC 3.80* 05/03/2012 0835   HGB 11.3* 05/03/2012 0835   HCT 34.7* 05/03/2012 0835   PLT 226 05/03/2012 0835   MCV 91.3 05/03/2012 0835   MCH 29.7 05/03/2012 0835   MCHC 32.6 05/03/2012 0835   RDW 13.2 05/03/2012 0835   LYMPHSABS 1.4 06/22/2011 1134   MONOABS 0.2 06/22/2011 1134   EOSABS 0.1 06/22/2011 1134   BASOSABS 0.0 06/22/2011 1134      Treatments: surgery: EL with BSO  Discharge Exam: Blood pressure 102/68, pulse 75, temperature 97.4 F (36.3 C), temperature source Oral, resp. rate 18, height 5\' 7"  (1.702 m), weight 161 lb (73.029 kg), SpO2 97.00%. Incision/Wound:C/D, +BS, nontender  Disposition: 01-Home or Self Care     Medication List     As of 05/04/2012  8:52 AM    STOP taking these medications         azithromycin 250 MG tablet   Commonly known as: ZITHROMAX      multivitamin with minerals Tabs      TAKE these medications         chlorpheniramine-HYDROcodone 10-8 MG/5ML Lqcr   Commonly known as: TUSSIONEX   Take 5 mLs by mouth every 12 (twelve) hours as needed.      HYDROcodone-homatropine 5-1.5 MG/5ML syrup   Commonly known as: HYCODAN   take 1 teaspoonful by mouth every 6 hours if needed for cough      hydrOXYzine 25 MG tablet   Commonly known as: ATARAX/VISTARIL   Take 25 mg by mouth every 6 (six) hours as needed. Takes for itching      ibuprofen 800 MG tablet   Commonly known as: ADVIL,MOTRIN   Take 1 tablet (800 mg total) by mouth  every 8 (eight) hours as needed (mild pain).      oxyCODONE-acetaminophen 5-325 MG per tablet   Commonly known as: PERCOCET/ROXICET   Take 1-2 tablets by mouth every 6 (six) hours as needed (moderate to severe pain (when tolerating fluids)).      triamcinolone 0.1 % paste   Commonly known as: KENALOG   Place onto teeth 2 (two) times daily.      zolpidem 5 MG tablet   Commonly known as: AMBIEN   Take 5 mg by mouth at bedtime as needed. For sleep           Follow-up Information    Follow up with Meriel Pica, MD. Call in 7 days.   Contact information:   [address] [place] [address] [phone]          Signed: Meriel Pica 05/04/2012, 8:52 AM

## 2012-05-04 NOTE — Progress Notes (Signed)
2 Days Post-Op Procedure(s) (LRB): EXPLORATORY LAPAROTOMY (N/A) SALPINGO OOPHERECTOMY (Bilateral)  Subjective: Patient reports tolerating PO.    Objective: I have reviewed patient's vital signs and labs.  BP 102/68  Pulse 75  Temp 97.4 F (36.3 C) (Oral)  Resp 18  Ht 5\' 7"  (1.702 m)  Wt 161 lb (73.029 kg)  BMI 25.22 kg/m2  SpO2 97% CBC    Component Value Date/Time   WBC 10.7* 05/03/2012 0835   RBC 3.80* 05/03/2012 0835   HGB 11.3* 05/03/2012 0835   HCT 34.7* 05/03/2012 0835   PLT 226 05/03/2012 0835   MCV 91.3 05/03/2012 0835   MCH 29.7 05/03/2012 0835   MCHC 32.6 05/03/2012 0835   RDW 13.2 05/03/2012 0835   LYMPHSABS 1.4 06/22/2011 1134   MONOABS 0.2 06/22/2011 1134   EOSABS 0.1 06/22/2011 1134   BASOSABS 0.0 06/22/2011 1134     INC C/D, ON Q cath out intact X 2  Assessment: s/p Procedure(s) (LRB) with comments: EXPLORATORY LAPAROTOMY (N/A) SALPINGO OOPHERECTOMY (Bilateral) - bilateral: stable  Plan: Discharge home  LOS: 2 days    Ysidra Sopher M 05/04/2012, 8:48 AM

## 2012-05-04 NOTE — Progress Notes (Signed)
Pt out in wheelchair  Mom with pt

## 2013-03-13 ENCOUNTER — Encounter: Payer: Managed Care, Other (non HMO) | Admitting: Internal Medicine

## 2013-03-29 ENCOUNTER — Encounter: Payer: Self-pay | Admitting: Internal Medicine

## 2013-03-29 ENCOUNTER — Other Ambulatory Visit (INDEPENDENT_AMBULATORY_CARE_PROVIDER_SITE_OTHER): Payer: Managed Care, Other (non HMO)

## 2013-03-29 ENCOUNTER — Ambulatory Visit (INDEPENDENT_AMBULATORY_CARE_PROVIDER_SITE_OTHER): Payer: Managed Care, Other (non HMO) | Admitting: Internal Medicine

## 2013-03-29 VITALS — BP 120/70 | HR 75 | Temp 97.2°F | Ht 67.0 in | Wt 169.1 lb

## 2013-03-29 DIAGNOSIS — J309 Allergic rhinitis, unspecified: Secondary | ICD-10-CM

## 2013-03-29 DIAGNOSIS — Z0000 Encounter for general adult medical examination without abnormal findings: Secondary | ICD-10-CM

## 2013-03-29 DIAGNOSIS — M5432 Sciatica, left side: Secondary | ICD-10-CM | POA: Insufficient documentation

## 2013-03-29 DIAGNOSIS — M5430 Sciatica, unspecified side: Secondary | ICD-10-CM

## 2013-03-29 DIAGNOSIS — Z23 Encounter for immunization: Secondary | ICD-10-CM

## 2013-03-29 LAB — CBC WITH DIFFERENTIAL/PLATELET
Basophils Relative: 0.6 % (ref 0.0–3.0)
Eosinophils Relative: 1 % (ref 0.0–5.0)
Lymphocytes Relative: 30 % (ref 12.0–46.0)
Neutrophils Relative %: 62.9 % (ref 43.0–77.0)
RBC: 4.18 Mil/uL (ref 3.87–5.11)
WBC: 6.3 10*3/uL (ref 4.5–10.5)

## 2013-03-29 LAB — BASIC METABOLIC PANEL
BUN: 10 mg/dL (ref 6–23)
CO2: 29 mEq/L (ref 19–32)
Chloride: 106 mEq/L (ref 96–112)
Creatinine, Ser: 0.5 mg/dL (ref 0.4–1.2)

## 2013-03-29 LAB — LIPID PANEL
Cholesterol: 179 mg/dL (ref 0–200)
HDL: 41.3 mg/dL (ref >39.00)
LDL Cholesterol: 123 mg/dL — ABNORMAL HIGH (ref 0–99)
Triglycerides: 72 mg/dL (ref 0.0–149.0)

## 2013-03-29 LAB — URINALYSIS, ROUTINE W REFLEX MICROSCOPIC
Nitrite: NEGATIVE
Total Protein, Urine: NEGATIVE
pH: 6 (ref 5.0–8.0)

## 2013-03-29 LAB — HEPATIC FUNCTION PANEL
ALT: 18 U/L (ref 0–35)
Total Bilirubin: 0.6 mg/dL (ref 0.3–1.2)
Total Protein: 7.2 g/dL (ref 6.0–8.3)

## 2013-03-29 MED ORDER — NAPROXEN 500 MG PO TABS: 500.0000 mg | ORAL_TABLET | Freq: Two times a day (BID) | ORAL | Status: DC

## 2013-03-29 MED ORDER — FEXOFENADINE HCL 180 MG PO TABS: 180.0000 mg | ORAL_TABLET | Freq: Every day | ORAL | Status: DC

## 2013-03-29 NOTE — Assessment & Plan Note (Signed)
For nsaid prn, consider MRI - declines at this time, as well declines ortho eval

## 2013-03-29 NOTE — Progress Notes (Signed)
Subjective:    Patient ID: [name], female    DOB: [birth year], 42 y.o.   MRN: [id number]  HPI Here for wellness and f/u;  Overall doing ok;  Pt denies CP, worsening SOB, DOE, wheezing, orthopnea, PND, worsening LE edema, palpitations, dizziness or syncope.  Pt denies neurological change such as new headache, facial or extremity weakness.  Pt denies polydipsia, polyuria, or low sugar symptoms. Pt states overall good compliance with treatment and medications, good tolerability, and has been trying to follow lower cholesterol diet.  Pt denies worsening depressive symptoms, suicidal ideation or panic. No fever, night sweats, wt loss, loss of appetite, or other constitutional symptoms.  Pt states good ability with ADL's, has low fall risk, home safety reviewed and adequate, no other significant changes in hearing or vision, and only occasionally active with exercise. Does have several wks ongoing nasal allergy symptoms with clearish congestion, itch and sneezing, without fever, pain, ST, cough, swelling or wheezing.  Also Pt continues to have recurring months of mil left LBP with some radiation to the left foot occasionaly without change in severity, no bowel or bladder change, fever, wt loss, other worsening LE pain/numbness/weakness, gait change or falls. Past Medical History  Diagnosis Date  . ANXIETY 08/04/2007  . DEPRESSION 08/04/2007  . SINUSITIS- ACUTE-NOS 07/25/2010  . ALLERGIC RHINITIS 08/04/2007  . GERD 08/04/2007  . FATIGUE 08/04/2007  . Abdominal pain, left lower quadrant 03/26/2008  . ABDOMINAL PAIN, SUPRAPUBIC 08/04/2007  . SVD (spontaneous vaginal delivery)     x 4  . Insomnia   . Headache(784.0)   . Anemia   . Cough     instructed to see primary care if worsens-no fever   Past Surgical History  Procedure Laterality Date  . Wisdom tooth extraction    . Tubal ligation    . Rhinoplasty      nose  . Laparoscopy  03/23/2012    Procedure: LAPAROSCOPY OPERATIVE;  Surgeon: Meriel Pica, MD;  Location: [facility];  Service: Gynecology;  Laterality: N/A;  with CO2 laser ablation of Vin;need microscope,vaginal vault excision of keloid;will want kenolog 40 from pharmacy.  . Scar revision  03/23/2012    Procedure: SCAR REVISION;  Surgeon: Meriel Pica, MD;  Location: [facility];  Service: Gynecology;  Laterality: N/A;  . Abdominal hysterectomy  2004  . Laparotomy  05/02/2012    Procedure: EXPLORATORY LAPAROTOMY;  Surgeon: Meriel Pica, MD;  Location: [facility];  Service: Gynecology;  Laterality: N/A;  . Salpingoophorectomy  05/02/2012    Procedure: SALPINGO OOPHERECTOMY;  Surgeon: Meriel Pica, MD;  Location: [facility];  Service: Gynecology;  Laterality: Bilateral;  bilateral    reports that she has never smoked. She has never used smokeless tobacco. She reports that she does not drink alcohol or use illicit drugs. family history includes Asthma in her mother. Allergies  Allergen Reactions  . Latex Shortness Of Breath, Swelling and Rash  . Other Itching and Nausea And Vomiting    Pain medication given after surgery; pt doesn't remember name.  Most likely opioid-derived.   Current Outpatient Prescriptions on File Prior to Visit  Medication Sig Dispense Refill  . oxyCODONE-acetaminophen (PERCOCET/ROXICET) 5-325 MG per tablet Take 1-2 tablets by mouth every 6 (six) hours as needed (moderate to severe pain (when tolerating fluids)).  30 tablet  0  . triamcinolone (KENALOG) 0.1 % paste Place onto teeth 2 (two) times daily.  5 g  12  . zolpidem (AMBIEN) 5 MG tablet  Take 5 mg by mouth at bedtime as needed. For sleep       No current facility-administered medications on file prior to visit.   Review of Systems Constitutional: Negative for diaphoresis, activity change, appetite change or unexpected weight change.  HENT: Negative for hearing loss, ear pain, facial swelling, mouth sores and neck stiffness.   Eyes: Negative for pain, redness and visual disturbance.  Respiratory:  Negative for shortness of breath and wheezing.   Cardiovascular: Negative for chest pain and palpitations.  Gastrointestinal: Negative for diarrhea, blood in stool, abdominal distention or other pain Genitourinary: Negative for hematuria, flank pain or change in urine volume.  Musculoskeletal: Negative for myalgias and joint swelling.  Skin: Negative for color change and wound.  Neurological: Negative for syncope and numbness. other than noted Hematological: Negative for adenopathy.  Psychiatric/Behavioral: Negative for hallucinations, self-injury, decreased concentration and agitation.      Objective:   Physical Exam BP 120/70  Pulse 75  Temp(Src) 97.2 F (36.2 C) (Oral)  Ht 5\' 7"  (1.702 m)  Wt 169 lb 2 oz (76.715 kg)  BMI 26.48 kg/m2  SpO2 96% VS noted,  Constitutional: Pt is oriented to person, place, and time. Appears well-developed and well-nourished.  Head: Normocephalic and atraumatic.  Right Ear: External ear normal.  Left Ear: External ear normal.  Nose: Nose normal.  Mouth/Throat: Oropharynx is clear and moist.  Eyes: Conjunctivae and EOM are normal. Pupils are equal, round, and reactive to light.  Neck: Normal range of motion. Neck supple. No JVD present. No tracheal deviation present.  Cardiovascular: Normal rate, regular rhythm, normal heart sounds and intact distal pulses.   Pulmonary/Chest: Effort normal and breath sounds normal.  Abdominal: Soft. Bowel sounds are normal. There is no tenderness. No HSM  Musculoskeletal: Normal range of motion. Exhibits no edema.  Lymphadenopathy:  Has no cervical adenopathy.  Neurological: Pt is alert and oriented to person, place, and time. Pt has normal reflexes. No cranial nerve deficit.  Skin: Skin is warm and dry. No rash noted.  Psychiatric:  Has  normal mood and affect. Behavior is normal.     Assessment & Plan:

## 2013-03-29 NOTE — Addendum Note (Signed)
Addended by: Scharlene Gloss B on: 03/29/2013 10:37 AM   Modules accepted: Orders

## 2013-03-29 NOTE — Assessment & Plan Note (Signed)
Ok for allegra prn,  to f/u any worsening symptoms or concerns 

## 2013-03-29 NOTE — Patient Instructions (Signed)
You had the tetanus shot today Please remember to followup with your GYN for the yearly pap smear and/or mammogram as you do Please take all new medication as prescribed - the anti-inflammatory, as well as the allegra for allergies Please continue all other medications as before, and refills have been done if requested. Please have the pharmacy call with any other refills you may need. Please continue your efforts at being more active, low cholesterol diet, and weight control. You are otherwise up to date with prevention measures today. Please keep your appointments with your specialists as you may have planned Please return if the left leg is worse with pain, numbness or weakness, as you may need MRI for the Lumbar Spine Please go to the LAB in the Basement (turn left off the elevator) for the tests to be done today You will be contacted by phone if any changes need to be made immediately.  Otherwise, you will receive a letter about your results with an explanation, but please check with MyChart first.  Please remember to sign up for My Chart if you have not done so, as this will be important to you in the future with finding out test results, communicating by private email, and scheduling acute appointments online when needed.  Please return in 1 year for your yearly visit, or sooner if needed, with Lab testing done 3-5 days before

## 2013-03-29 NOTE — Assessment & Plan Note (Signed)

## 2013-06-13 ENCOUNTER — Ambulatory Visit (INDEPENDENT_AMBULATORY_CARE_PROVIDER_SITE_OTHER): Payer: Managed Care, Other (non HMO) | Admitting: Internal Medicine

## 2013-06-13 ENCOUNTER — Encounter: Payer: Self-pay | Admitting: Internal Medicine

## 2013-06-13 VITALS — BP 110/80 | HR 78 | Temp 97.6°F | Wt 167.1 lb

## 2013-06-13 DIAGNOSIS — M5432 Sciatica, left side: Secondary | ICD-10-CM

## 2013-06-13 DIAGNOSIS — L299 Pruritus, unspecified: Secondary | ICD-10-CM | POA: Insufficient documentation

## 2013-06-13 DIAGNOSIS — M5430 Sciatica, unspecified side: Secondary | ICD-10-CM

## 2013-06-13 DIAGNOSIS — F411 Generalized anxiety disorder: Secondary | ICD-10-CM

## 2013-06-13 DIAGNOSIS — N949 Unspecified condition associated with female genital organs and menstrual cycle: Secondary | ICD-10-CM

## 2013-06-13 DIAGNOSIS — R102 Pelvic and perineal pain: Secondary | ICD-10-CM

## 2013-06-13 MED ORDER — PREDNISONE 10 MG PO TABS: ORAL_TABLET | ORAL | Status: DC

## 2013-06-13 MED ORDER — CYCLOBENZAPRINE HCL 5 MG PO TABS: 5.0000 mg | ORAL_TABLET | Freq: Three times a day (TID) | ORAL | Status: DC | PRN

## 2013-06-13 NOTE — Assessment & Plan Note (Signed)
For trial triam cr prn,  to f/u any worsening symptoms or concerns

## 2013-06-13 NOTE — Assessment & Plan Note (Signed)
Mild today, delcines need for counseling

## 2013-06-13 NOTE — Progress Notes (Signed)
Subjective:    Patient ID: [name], female    DOB: March 29, 1971, 42 y.o.   MRN: [id number]  HPI Here with flare of pain left lower back, mod to severe, x 3 days, worse after pushing and pulling at work,  Radiates occas to the distal LLE with tingling but no weakness or leg giveaway, no bowel or bladder change, fever, wt loss, gait change or falls. Also with ongoing LLQ/?pelvic pain, neg eval per Dr Ronnald Ramp per pt, sometimes pain may radiate from the left lower back, and also an itchy rash to the area complicates as well. Denies worsening depressive symptoms, suicidal ideation, or panic; has ongoing anxiety Past Medical History  Diagnosis Date  . ANXIETY 08/04/2007  . DEPRESSION 08/04/2007  . SINUSITIS- ACUTE-NOS 07/25/2010  . ALLERGIC RHINITIS 08/04/2007  . GERD 08/04/2007  . FATIGUE 08/04/2007  . Abdominal pain, left lower quadrant 03/26/2008  . ABDOMINAL PAIN, SUPRAPUBIC 08/04/2007  . SVD (spontaneous vaginal delivery)     x 4  . Insomnia   . Headache(784.0)   . Anemia   . Cough     instructed to see primary care if worsens-no fever   Past Surgical History  Procedure Laterality Date  . Wisdom tooth extraction    . Tubal ligation    . Rhinoplasty      nose  . Laparoscopy  03/23/2012    Procedure: LAPAROSCOPY OPERATIVE;  Surgeon: Meriel Pica, MD;  Location: [facility];  Service: Gynecology;  Laterality: N/A;  with CO2 laser ablation of Vin;need microscope,vaginal vault excision of keloid;will want kenolog 40 from pharmacy.  . Scar revision  03/23/2012    Procedure: SCAR REVISION;  Surgeon: Meriel Pica, MD;  Location: [facility];  Service: Gynecology;  Laterality: N/A;  . Abdominal hysterectomy  2004  . Laparotomy  05/02/2012    Procedure: EXPLORATORY LAPAROTOMY;  Surgeon: Meriel Pica, MD;  Location: [facility];  Service: Gynecology;  Laterality: N/A;  . Salpingoophorectomy  05/02/2012    Procedure: SALPINGO OOPHERECTOMY;  Surgeon: Meriel Pica, MD;  Location: [facility];   Service: Gynecology;  Laterality: Bilateral;  bilateral    reports that she has never smoked. She has never used smokeless tobacco. She reports that she does not drink alcohol or use illicit drugs. family history includes Asthma in her mother. Allergies  Allergen Reactions  . Latex Shortness Of Breath, Swelling and Rash  . Other Itching and Nausea And Vomiting    Pain medication given after surgery; pt doesn't remember name.  Most likely opioid-derived.   Current Outpatient Prescriptions on File Prior to Visit  Medication Sig Dispense Refill  . naproxen (NAPROSYN) 500 MG tablet Take 1 tablet (500 mg total) by mouth 2 (two) times daily with a meal.  60 tablet  2  . zolpidem (AMBIEN) 5 MG tablet Take 5 mg by mouth at bedtime as needed. For sleep      . fexofenadine (ALLEGRA) 180 MG tablet Take 1 tablet (180 mg total) by mouth daily.  90 tablet  3  . oxyCODONE-acetaminophen (PERCOCET/ROXICET) 5-325 MG per tablet Take 1-2 tablets by mouth every 6 (six) hours as needed (moderate to severe pain (when tolerating fluids)).  30 tablet  0   No current facility-administered medications on file prior to visit.   Review of Systems  Constitutional: Negative for unexpected weight change, or unusual diaphoresis  HENT: Negative for tinnitus.   Eyes: Negative for photophobia and visual disturbance.  Respiratory: Negative for choking and stridor.  Gastrointestinal: Negative for vomiting and blood in stool.  Genitourinary: Negative for hematuria and decreased urine volume.  Musculoskeletal: Negative for acute joint swelling Skin: Negative for color change and wound.  Neurological: Negative for tremors and numbness other than noted  Psychiatric/Behavioral: Negative for decreased concentration or  hyperactivity.       Objective:   Physical Exam  BP 110/80  Pulse 78  Temp(Src) 97.6 F (36.4 C) (Oral)  Wt 167 lb 2 oz (75.807 kg)  BMI 26.17 kg/m2  SpO2 95% VS noted,  Constitutional: Pt appears  well-developed and well-nourished.  HENT: Head: NCAT.  Right Ear: External ear normal.  Left Ear: External ear normal.  Eyes: Conjunctivae and EOM are normal. Pupils are equal, round, and reactive to light.  Neck: Normal range of motion. Neck supple.  Cardiovascular: Normal rate and regular rhythm.   Pulmonary/Chest: Effort normal and breath sounds normal.  Abd:  Soft, NT, non-distended, + BS Spine Nontender, some left lumbar paravertebral tedner noted Neurological: Pt is alert. Not confused , motor/sens/dtr intact LE's Skin: Skin is warm. No erythema. No Rash but mult excoraitions to the LLQ noted Psychiatric: Pt behavior is normal. Thought content normal. 1-2+ nervous    Assessment & Plan:

## 2013-06-13 NOTE — Assessment & Plan Note (Signed)
With acute flare, has percocet at home and nsaid, also for trial predpack and muscle relaxer, off work tonight and no lifting/pulling > 20 lbs for 1 wk; consider ortho referral

## 2013-06-13 NOTE — Assessment & Plan Note (Signed)
Not clear to me that she is not having left radicular pain from lumbar,  to f/u any worsening symptoms or concerns, consider neurontin trial

## 2013-06-13 NOTE — Patient Instructions (Signed)
Please take all new medication as prescribed - the muscle relaxer and prednisone Please continue all other medications as before  You are given the work note

## 2013-06-28 ENCOUNTER — Encounter: Payer: Self-pay | Admitting: Internal Medicine

## 2013-06-28 ENCOUNTER — Ambulatory Visit (INDEPENDENT_AMBULATORY_CARE_PROVIDER_SITE_OTHER): Payer: Managed Care, Other (non HMO) | Admitting: Internal Medicine

## 2013-06-28 VITALS — BP 90/68 | HR 72 | Temp 98.5°F | Ht 67.0 in | Wt 167.0 lb

## 2013-06-28 DIAGNOSIS — IMO0002 Reserved for concepts with insufficient information to code with codable children: Secondary | ICD-10-CM

## 2013-06-28 DIAGNOSIS — M5416 Radiculopathy, lumbar region: Secondary | ICD-10-CM | POA: Insufficient documentation

## 2013-06-28 NOTE — Progress Notes (Signed)
Subjective:    Patient ID: [name], female    DOB: 1971/03/29, 42 y.o.   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 Past Medical History  Diagnosis Date  . ANXIETY 08/04/2007  . DEPRESSION 08/04/2007  . SINUSITIS- ACUTE-NOS 07/25/2010  . ALLERGIC RHINITIS 08/04/2007  . GERD 08/04/2007  . FATIGUE 08/04/2007  . Abdominal pain, left lower quadrant 03/26/2008  . ABDOMINAL PAIN, SUPRAPUBIC 08/04/2007  . SVD (spontaneous vaginal delivery)     x 4  . Insomnia   . Headache(784.0)   . Anemia   . Cough     instructed to see primary care if worsens-no fever   Past Surgical History  Procedure Laterality Date  . Wisdom tooth extraction    . Tubal ligation    . Rhinoplasty      nose  . Laparoscopy  03/23/2012    Procedure: LAPAROSCOPY OPERATIVE;  Surgeon: Meriel Pica, MD;  Location: [facility];  Service: Gynecology;  Laterality: N/A;  with CO2 laser ablation of Vin;need microscope,vaginal vault excision of keloid;will want kenolog 40 from pharmacy.  . Scar revision  03/23/2012    Procedure: SCAR REVISION;  Surgeon: Meriel Pica, MD;  Location: [facility];  Service: Gynecology;  Laterality: N/A;  . Abdominal hysterectomy  2004  . Laparotomy  05/02/2012    Procedure: EXPLORATORY LAPAROTOMY;  Surgeon: Meriel Pica, MD;  Location: [facility];  Service: Gynecology;  Laterality: N/A;  . Salpingoophorectomy  05/02/2012    Procedure: SALPINGO OOPHERECTOMY;  Surgeon: Meriel Pica, MD;  Location: [facility];  Service: Gynecology;  Laterality: Bilateral;  bilateral    reports that she has never smoked. She has never used smokeless tobacco. She reports  that she does not drink alcohol or use illicit drugs. family history includes Asthma in her mother. Allergies  Allergen Reactions  . Latex Shortness Of Breath, Swelling and Rash  . Other Itching and Nausea And Vomiting    Pain medication given after surgery; pt doesn't remember name.  Most likely opioid-derived.   Current Outpatient Prescriptions on File Prior to Visit  Medication Sig Dispense Refill  . fexofenadine (ALLEGRA) 180 MG tablet Take 1 tablet (180 mg total) by mouth daily.  90 tablet  3  . naproxen (NAPROSYN) 500 MG tablet Take 1 tablet (500 mg total) by mouth 2 (two) times daily with a meal.  60 tablet  2  . oxyCODONE-acetaminophen (PERCOCET/ROXICET) 5-325 MG per tablet Take 1-2 tablets by mouth every 6 (six) hours as needed (moderate to severe pain (when tolerating fluids)).  30 tablet  0  . zolpidem (AMBIEN) 5 MG tablet Take 5 mg by mouth at bedtime as needed. For sleep       No current facility-administered medications on file prior to visit.   Review of Systems  Constitutional: Negative for unexpected weight change, or unusual diaphoresis  HENT: Negative for tinnitus.   Eyes: Negative for photophobia and visual disturbance.  Respiratory: Negative for choking and stridor.   Gastrointestinal: Negative for vomiting and blood in stool.  Genitourinary: Negative for hematuria and decreased urine volume.  Musculoskeletal: Negative for acute  joint swelling Skin: Negative for color change and wound.  Neurological: Negative for tremors and numbness other than noted  Psychiatric/Behavioral: Negative for decreased concentration or  hyperactivity.       Objective:   Physical Exam BP 90/68  Pulse 72  Temp(Src) 98.5 F (36.9 C) (Oral)  Ht 5\' 7"  (1.702 m)  Wt 167 lb (75.751 kg)  BMI 26.15 kg/m2  SpO2 98%'VS noted,  Constitutional: Pt appears well-developed and well-nourished.  HENT: Head: NCAT.  Right Ear: External ear normal.  Left Ear: External ear normal.  Eyes:  Conjunctivae and EOM are normal. Pupils are equal, round, and reactive to light.  Neck: Normal range of motion. Neck supple.  Cardiovascular: Normal rate and regular rhythm.   Pulmonary/Chest: Effort normal and breath sounds normal.  Abd:  Soft, NT, non-distended, + BS Neurological: Pt is alert. Not confused , motor 4+/5 LLE with decrease sens LLE, limps with gait/antalgic Spine nontender Skin: Skin is warm. No erythema.  Psychiatric: Pt behavior is normal. Thought content normal.     Assessment & Plan:

## 2013-06-28 NOTE — Progress Notes (Signed)
Pre-visit discussion using our clinic review tool. No additional management support is needed unless otherwise documented below in the visit note.  

## 2013-06-28 NOTE — Patient Instructions (Signed)
OK to stop the muscle relaxer Please continue all other medications as before, and refills have been done if requested. You will be contacted regarding the referral for: orthopedic Please call if you would like me to order the MRI for the lower back, or otherwise wait to see the orthopedic You are given the work note Please continue all other medications as before

## 2013-07-03 NOTE — Assessment & Plan Note (Addendum)
With worsening neuro change, declines MRI due to cost but asks for ortho referral,  to f/u any worsening symptoms or concerns, cont pain control

## 2013-07-06 DIAGNOSIS — Z0279 Encounter for issue of other medical certificate: Secondary | ICD-10-CM

## 2013-07-31 NOTE — H&P (Signed)
[facility]  DICTATION # [id number] CSN# [id number]   Meriel Pica, MD 07/31/2013 1:44 PM

## 2013-08-01 ENCOUNTER — Encounter (HOSPITAL_COMMUNITY): Payer: Self-pay | Admitting: Pharmacist

## 2013-08-02 NOTE — H&P (Signed)
[name], [name]                    ACCOUNT NO.:  [id number]  MEDICAL RECORD NO.:  [id number]  LOCATION:                                 FACILITY:  PHYSICIAN:  Duke Salvia. Marcelle Overlie, M.D.DATE OF BIRTH:  1970-12-10  DATE OF ADMISSION: DATE OF DISCHARGE:                             HISTORY & PHYSICAL   CHIEF COMPLAINT:  Pain in her old scar.  HISTORY OF PRESENT ILLNESS:  A 42 year old, G6, P4.  This patient underwent TAH for fibroids in 2009.  Ovaries were normal at that time. Since that time, she experienced worsening left lower quadrant pain. 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 In September 2013, she underwent laparoscopy, ultimately had laparotomy, lysis of adhesions with BSO.  She presents now for revision of her Pfannenstiel scar, which has developed some moderate keloid and has caused her problems related to pain under the incision.  There is no evidence of cellulitis, hernia, no endometriotic nodule.  She understands the risks related to scar revision including bleeding, infection, recurrent keloid formation that may result in similar issues. She understands that this will be a superficial scar revision of the skin and subcutaneous tissue.  PAST MEDICAL HISTORY:  None.  ALLERGIES:  None.  CURRENT MEDICATIONS:  Percocet p.r.n. for pain.  SURGICAL HISTORY:  Hysterectomy in 2009, breast biopsy of a benign disease in 2012.  Diagnostic laparoscopy with laser ablation of VAIN 1, prior laparotomy with BSO.  SOCIAL HISTORY:  Denies alcohol, tobacco, or drug use.  She is married. Last Pap in May 2014 was negative except for BV which was treated.  PHYSICAL EXAMINATION:  VITAL SIGNS:  Temp 98.2, blood pressure 120/72. HEENT:  Unremarkable. NECK:  Supple without masses. LUNGS:  Clear. CARDIOVASCULAR:  Regular rate and rhythm without murmurs, rubs, gallops. BREASTS:  Without masses. ABDOMEN:  Soft, flat, nontender.  There is a moderate keloid  on Pfannenstiel incision.  She has had 2 prior laparotomies to this area of vulva, vaginal cuff by manual, otherwise negative. EXTREMITIES:  Unremarkable. NEUROLOGIC:  Unremarkable.  IMPRESSION:  Persistent scar pain, moderate keloid, and laparotomy scar from 2 prior surgeries.  PLAN:  Outpatient scar revision, dilute steroid injection will be accomplished at the same time.  Procedure including risks related to bleeding, infection, recurrence of the keloid along with her expected recovery time all discussed, which she understands and accepts.     [name] M. Marcelle Overlie, M.D.     [facility]/MEDQ  D:  07/31/2013  T:  08/01/2013  Job:  [id number]

## 2013-08-07 MED ORDER — DEXTROSE 5 % IV SOLN
2.0000 g | INTRAVENOUS | Status: AC
  Filled 2013-08-07: qty 2

## 2013-08-08 ENCOUNTER — Ambulatory Visit (HOSPITAL_COMMUNITY)
Admission: RE | Admit: 2013-08-08 | Discharge: 2013-08-08 | Disposition: A | Discharge status: Home / Self Care | Payer: Managed Care, Other (non HMO) | Source: Ambulatory Visit | Attending: Obstetrics and Gynecology | Admitting: Obstetrics and Gynecology

## 2013-08-08 ENCOUNTER — Ambulatory Visit (HOSPITAL_COMMUNITY): Payer: Managed Care, Other (non HMO) | Admitting: Anesthesiology

## 2013-08-08 ENCOUNTER — Encounter (HOSPITAL_COMMUNITY): Payer: Managed Care, Other (non HMO) | Admitting: Anesthesiology

## 2013-08-08 ENCOUNTER — Encounter (HOSPITAL_COMMUNITY)
Admission: RE | Disposition: A | Discharge status: Home / Self Care | Payer: Self-pay | Source: Ambulatory Visit | Attending: Obstetrics and Gynecology

## 2013-08-08 ENCOUNTER — Encounter (HOSPITAL_COMMUNITY): Payer: Self-pay | Admitting: Anesthesiology

## 2013-08-08 DIAGNOSIS — R1032 Left lower quadrant pain: Secondary | ICD-10-CM | POA: Insufficient documentation

## 2013-08-08 DIAGNOSIS — Z90710 Acquired absence of both cervix and uterus: Secondary | ICD-10-CM | POA: Insufficient documentation

## 2013-08-08 DIAGNOSIS — L910 Hypertrophic scar: Secondary | ICD-10-CM | POA: Insufficient documentation

## 2013-08-08 HISTORY — PX: SCAR REVISION: SHX5285

## 2013-08-08 LAB — CBC
Hemoglobin: 13.1 g/dL (ref 12.0–15.0)
MCV: 89.1 fL (ref 78.0–100.0)
Platelets: 143 10*3/uL — ABNORMAL LOW (ref 150–400)
RDW: 13.9 % (ref 11.5–15.5)
WBC: 6.7 10*3/uL (ref 4.0–10.5)

## 2013-08-08 SURGERY — REVISION, SCAR
Anesthesia: General

## 2013-08-08 MED ORDER — DEXTROSE 5 % IV SOLN
2.0000 g | INTRAVENOUS | Status: DC | PRN
  Administered 2013-08-08: 2 g via INTRAVENOUS

## 2013-08-08 MED ORDER — OXYCODONE-ACETAMINOPHEN 5-325 MG PO TABS
1.0000 | ORAL_TABLET | ORAL | Status: DC | PRN
  Administered 2013-08-08: 1 via ORAL

## 2013-08-08 MED ORDER — LIDOCAINE HCL (CARDIAC) 20 MG/ML IV SOLN
INTRAVENOUS | Status: DC | PRN
  Administered 2013-08-08: 70 mg via INTRAVENOUS
  Administered 2013-08-08: 30 mg via INTRAVENOUS

## 2013-08-08 MED ORDER — LACTATED RINGERS IV SOLN
INTRAVENOUS | Status: DC
  Administered 2013-08-08 (×2): via INTRAVENOUS

## 2013-08-08 MED ORDER — TRIAMCINOLONE ACETONIDE 40 MG/ML IJ SUSP
40.0000 mg | Freq: Once | INTRAMUSCULAR | Status: AC
  Administered 2013-08-08: 40 mg via INTRAMUSCULAR
  Filled 2013-08-08: qty 1

## 2013-08-08 MED ORDER — LIDOCAINE HCL 1 % IJ SOLN
INTRAMUSCULAR | Status: AC
  Filled 2013-08-08: qty 20

## 2013-08-08 MED ORDER — KETOROLAC TROMETHAMINE 30 MG/ML IJ SOLN
INTRAMUSCULAR | Status: DC | PRN
  Administered 2013-08-08: 30 mg via INTRAVENOUS

## 2013-08-08 MED ORDER — OXYCODONE-ACETAMINOPHEN 2.5-325 MG PO TABS: 1.0000 | ORAL_TABLET | ORAL | Status: DC | PRN

## 2013-08-08 MED ORDER — LIDOCAINE HCL (CARDIAC) 20 MG/ML IV SOLN
INTRAVENOUS | Status: AC
  Filled 2013-08-08: qty 5

## 2013-08-08 MED ORDER — KETOROLAC TROMETHAMINE 30 MG/ML IJ SOLN
INTRAMUSCULAR | Status: AC
  Filled 2013-08-08: qty 1

## 2013-08-08 MED ORDER — ONDANSETRON HCL 4 MG/2ML IJ SOLN: 4.0000 mg | Freq: Once | INTRAMUSCULAR | Status: DC | PRN

## 2013-08-08 MED ORDER — OXYCODONE-ACETAMINOPHEN 5-325 MG PO TABS
ORAL_TABLET | ORAL | Status: DC
Start: 2013-08-08 — End: 2013-08-08
  Filled 2013-08-08: qty 1

## 2013-08-08 MED ORDER — MIDAZOLAM HCL 2 MG/2ML IJ SOLN
INTRAMUSCULAR | Status: AC
  Filled 2013-08-08: qty 2

## 2013-08-08 MED ORDER — PHENYLEPHRINE 40 MCG/ML (10ML) SYRINGE FOR IV PUSH (FOR BLOOD PRESSURE SUPPORT)
PREFILLED_SYRINGE | INTRAVENOUS | Status: AC
  Filled 2013-08-08: qty 5

## 2013-08-08 MED ORDER — MEPERIDINE HCL 25 MG/ML IJ SOLN: 6.2500 mg | INTRAMUSCULAR | Status: DC | PRN

## 2013-08-08 MED ORDER — MIDAZOLAM HCL 2 MG/2ML IJ SOLN
INTRAMUSCULAR | Status: DC | PRN
  Administered 2013-08-08: 2 mg via INTRAVENOUS

## 2013-08-08 MED ORDER — ONDANSETRON HCL 4 MG/2ML IJ SOLN
INTRAMUSCULAR | Status: AC
  Filled 2013-08-08: qty 2

## 2013-08-08 MED ORDER — FENTANYL CITRATE 0.05 MG/ML IJ SOLN
INTRAMUSCULAR | Status: DC | PRN
  Administered 2013-08-08 (×2): 50 ug via INTRAVENOUS

## 2013-08-08 MED ORDER — FENTANYL CITRATE 0.05 MG/ML IJ SOLN
INTRAMUSCULAR | Status: AC
  Filled 2013-08-08: qty 2

## 2013-08-08 MED ORDER — KETOROLAC TROMETHAMINE 30 MG/ML IJ SOLN: 15.0000 mg | Freq: Once | INTRAMUSCULAR | Status: DC | PRN

## 2013-08-08 MED ORDER — FENTANYL CITRATE 0.05 MG/ML IJ SOLN
25.0000 ug | INTRAMUSCULAR | Status: DC | PRN
  Administered 2013-08-08: 25 ug via INTRAVENOUS

## 2013-08-08 MED ORDER — ONDANSETRON HCL 4 MG/2ML IJ SOLN
INTRAMUSCULAR | Status: DC | PRN
  Administered 2013-08-08: 4 mg via INTRAVENOUS

## 2013-08-08 MED ORDER — PROPOFOL 10 MG/ML IV EMUL
INTRAVENOUS | Status: AC
  Filled 2013-08-08: qty 20

## 2013-08-08 MED ORDER — PROPOFOL 10 MG/ML IV BOLUS
INTRAVENOUS | Status: DC | PRN
  Administered 2013-08-08: 150 mg via INTRAVENOUS

## 2013-08-08 MED ORDER — PHENYLEPHRINE HCL 10 MG/ML IJ SOLN
INTRAMUSCULAR | Status: DC | PRN
  Administered 2013-08-08 (×2): 40 ug via INTRAVENOUS

## 2013-08-08 MED ORDER — BUPIVACAINE LIPOSOME 1.3 % IJ SUSP
20.0000 mL | Freq: Once | INTRAMUSCULAR | Status: AC
  Administered 2013-08-08: 20 mL
  Filled 2013-08-08: qty 20

## 2013-08-08 SURGICAL SUPPLY — 18 items
BLADE SURG 15 STRL LF C SS BP (BLADE) ×1 IMPLANT
BLADE SURG 15 STRL SS (BLADE) ×2
CANISTER SUCT 3000ML (MISCELLANEOUS) IMPLANT
CLOTH BEACON ORANGE TIMEOUT ST (SAFETY) ×2 IMPLANT
DECANTER SPIKE VIAL GLASS SM (MISCELLANEOUS) IMPLANT
ELECT NDL TIP 2.8 STRL (NEEDLE) IMPLANT
ELECT NEEDLE TIP 2.8 STRL (NEEDLE) IMPLANT
ELECT REM PT RETURN 9FT ADLT (ELECTROSURGICAL) ×2
ELECTRODE REM PT RTRN 9FT ADLT (ELECTROSURGICAL) ×1 IMPLANT
GLOVE BIO SURGEON STRL SZ7 (GLOVE) ×2 IMPLANT
GOWN STRL REIN XL XLG (GOWN DISPOSABLE) ×4 IMPLANT
NEEDLE HYPO 22GX1.5 SAFETY (NEEDLE) IMPLANT
PACK ABDOMINAL MINOR (CUSTOM PROCEDURE TRAY) ×2 IMPLANT
SPONGE LAP 18X18 X RAY DECT (DISPOSABLE) IMPLANT
STRIP CLOSURE SKIN 1/2X4 (GAUZE/BANDAGES/DRESSINGS) ×1 IMPLANT
SUT MON AB 4-0 PS1 27 (SUTURE) ×1 IMPLANT
SYR CONTROL 10ML LL (SYRINGE) IMPLANT
TOWEL OR 17X24 6PK STRL BLUE (TOWEL DISPOSABLE) ×4 IMPLANT

## 2013-08-08 NOTE — Progress Notes (Signed)
The patient was re-examined with no change in status 

## 2013-08-08 NOTE — Anesthesia Preprocedure Evaluation (Addendum)
Anesthesia Evaluation  Patient identified by MRN, date of birth, ID band Patient awake    Reviewed: Allergy & Precautions, H&P , NPO status , Patient's Chart, lab work & pertinent test results, reviewed documented beta blocker date and time   Airway Mallampati: II TM Distance: >3 FB Neck ROM: full    Dental no notable dental hx. (+) Teeth Intact   Pulmonary neg pulmonary ROS,    Pulmonary exam normal       Cardiovascular negative cardio ROS      Neuro/Psych negative psych ROS   GI/Hepatic Neg liver ROS,   Endo/Other  negative endocrine ROS  Renal/GU negative Renal ROS     Musculoskeletal   Abdominal Normal abdominal exam  (+)   Peds  Hematology   Anesthesia Other Findings   Reproductive/Obstetrics negative OB ROS                           Anesthesia Physical Anesthesia Plan  ASA: II  Anesthesia Plan: General   Post-op Pain Management:    Induction: Intravenous  Airway Management Planned: Oral ETT and LMA  Additional Equipment:   Intra-op Plan:   Post-operative Plan: Extubation in OR  Informed Consent: I have reviewed the patients History and Physical, chart, labs and discussed the procedure including the risks, benefits and alternatives for the proposed anesthesia with the patient or authorized representative who has indicated his/her understanding and acceptance.   Dental Advisory Given  Plan Discussed with: CRNA, Surgeon and Anesthesiologist  Anesthesia Plan Comments:        Anesthesia Quick Evaluation

## 2013-08-08 NOTE — Op Note (Signed)
Preoperative diagnosis: Keloid formation and in old scar, chronic pain  Postoperative diagnosis same  Procedure: Scar revision  Surgeon: Marcelle Overlie  Anesthesia: Gen.  EBL: 50 cc  Procedure and findings:  The patient taken the operating room after an adequate level of general anesthesia was obtained with the patient supine position she was prepped and draped after appropriate timeout for taken. The old keloid transverse scar was excised and 80 lips, some of the subcutaneous fibrotic tissue was excised also. Once this was completed Bovie was used to make the underlying subcutaneous tissue hemostatic the skin was undermined to reduce tension, this was irrigated and again made hemostatic with the Bovie. Fairly thin as far subcutaneous layers concerned. Kenalog 40 mg then 10 cc of saline was then injected into the subcutaneous space prior to closure 4-0 Monocryl subcuticular closure with good approximation. Diluted Exparel was then injected into the incision for postop analgesia. Honeycombing plus pressure dressing applied she tolerated this well went to recovery room in good condition.  Dictated with [facility]  Mitchael Luckey M. Milana Obey.D.

## 2013-08-08 NOTE — Transfer of Care (Signed)
Immediate Anesthesia Transfer of Care Note  Patient: [name]  Procedure(s) Performed: Procedure(s): SCAR REVISION ABDOMINAL (N/A)  Patient Location: PACU  Anesthesia Type:General  Level of Consciousness: awake, alert , oriented and patient cooperative  Airway & Oxygen Therapy: Patient Spontanous Breathing and Patient connected to nasal cannula oxygen  Post-op Assessment: Report given to PACU RN and Post -op Vital signs reviewed and stable  Post vital signs: Reviewed and stable  Complications: No apparent anesthesia complications

## 2013-08-08 NOTE — Anesthesia Postprocedure Evaluation (Signed)
  Anesthesia Post-op Note  Patient: [name]  Procedure(s) Performed: Procedure(s): SCAR REVISION ABDOMINAL (N/A) Patient is awake and responsive. Pain and nausea are reasonably well controlled. Vital signs are stable and clinically acceptable. Oxygen saturation is clinically acceptable. There are no apparent anesthetic complications at this time. Patient is ready for discharge.

## 2013-08-09 ENCOUNTER — Encounter (HOSPITAL_COMMUNITY): Payer: Self-pay | Admitting: Obstetrics and Gynecology

## 2013-10-19 ENCOUNTER — Encounter: Payer: Self-pay | Admitting: Physician Assistant

## 2013-10-19 ENCOUNTER — Ambulatory Visit (INDEPENDENT_AMBULATORY_CARE_PROVIDER_SITE_OTHER): Payer: Managed Care, Other (non HMO) | Admitting: Physician Assistant

## 2013-10-19 VITALS — BP 102/70 | HR 95 | Temp 100.3°F

## 2013-10-19 DIAGNOSIS — J019 Acute sinusitis, unspecified: Secondary | ICD-10-CM

## 2013-10-19 DIAGNOSIS — B9689 Other specified bacterial agents as the cause of diseases classified elsewhere: Secondary | ICD-10-CM

## 2013-10-19 MED ORDER — HYDROCOD POLST-CHLORPHEN POLST 10-8 MG/5ML PO LQCR: 5.0000 mL | Freq: Two times a day (BID) | ORAL | Status: DC

## 2013-10-19 MED ORDER — AMOXICILLIN 875 MG PO TABS: 875.0000 mg | ORAL_TABLET | Freq: Two times a day (BID) | ORAL | Status: DC

## 2013-10-19 NOTE — Progress Notes (Signed)
    Patient ID: [name] is a 43 y.o. female DOB: 04/21/1971 MRN: [id number]  Subjective:    HPI  complains of head cold symptoms, ?sinusitus Onset >1 week ago, initially improved then relapsing and worse symptoms  First associated with rhinorrhea, sneezing, sore throat, mild headache and low grade fever Now sinus pressure and mild-mod nasal congestion, yellow-green discharge No relief with OTC meds - Ibuprofen, dayQuil/NyQuil Precipitated by sick contacts and weather change  Past Medical History  Diagnosis Date  . ANXIETY 08/04/2007  . DEPRESSION 08/04/2007  . SINUSITIS- ACUTE-NOS 07/25/2010  . ALLERGIC RHINITIS 08/04/2007  . GERD 08/04/2007  . FATIGUE 08/04/2007  . Abdominal pain, left lower quadrant 03/26/2008  . ABDOMINAL PAIN, SUPRAPUBIC 08/04/2007  . SVD (spontaneous vaginal delivery)     x 4  . Insomnia   . Headache(784.0)   . Anemia   . Cough     instructed to see primary care if worsens-no fever    Review of Systems Constitutional: No night sweats, no unexpected weight change Pulmonary: No wheezing or hemoptysis Cardiovascular: No chest pain or palpitations     Objective:   Physical Exam BP 102/70  Pulse 95  Temp(Src) 100.3 F (37.9 C) (Oral)  SpO2 95% GEN: mildly ill appearing and audible head/chest congestion HENT: NCAT, TM's normal bilateral, mild sinus tenderness bilaterally, nares with thick discharge and turbinate swelling, oropharynx mild erythema and PND, no exudate Eyes: Vision grossly intact, no conjunctivitis Lungs: Clear to auscultation without rhonchi or wheeze, no increased work of breathing Cardiovascular: Regular rate and rhythm, no bilateral edema  Lab Results  Component Value Date   WBC 6.7 08/08/2013   HGB 13.1 08/08/2013   HCT 39.9 08/08/2013   PLT 143* 08/08/2013   GLUCOSE 92 03/29/2013   CHOL 179 03/29/2013   TRIG 72.0 03/29/2013   HDL 41.30 03/29/2013   LDLCALC 123* 03/29/2013   ALT 18 03/29/2013   AST 19 03/29/2013   NA 137  03/29/2013   K 3.9 03/29/2013   CL 106 03/29/2013   CREATININE 0.5 03/29/2013   BUN 10 03/29/2013   CO2 29 03/29/2013   TSH 0.71 03/29/2013      Assessment & Plan:  Viral URI > progression to acute sinusitis Cough, postnasal drip related to above Febrile: 100.3*  Empiric antibiotics prescribed due to symptom duration greater than 7 days and progression despite OTC symptomatic care Prescription cough suppression - new prescriptions done Symptomatic care with Tylenol or Advil, hydration and rest -  Saline irrigation and salt gargle advised as needed

## 2013-10-19 NOTE — Patient Instructions (Addendum)
It was great to meet you today [name]!  A prescription for an antibiotic has been sent to your pharmacy. Please take as instructed until medication is completed.     Sinusitis Sinusitis is redness, soreness, and puffiness (inflammation) of the air pockets in the bones of your face (sinuses). The redness, soreness, and puffiness can cause air and mucus to get trapped in your sinuses. This can allow germs to grow and cause an infection.  HOME CARE   Drink enough fluids to keep your pee (urine) clear or pale yellow.  Use a humidifier in your home.  Run a hot shower to create steam in the bathroom. Sit in the bathroom with the door closed. Breathe in the steam 3 4 times a day.  Put a warm, moist washcloth on your face 3 4 times a day, or as told by your doctor.  Use salt water sprays (saline sprays) to wet the thick fluid in your nose. This can help the sinuses drain.  Only take medicine as told by your doctor. GET HELP RIGHT AWAY IF:   Your pain gets worse.  You have very bad headaches.  You are sick to your stomach (nauseous).  You throw up (vomit).  You are very sleepy (drowsy) all the time.  Your face is puffy (swollen).  Your vision changes.  You have a stiff neck.  You have trouble breathing. MAKE SURE YOU:   Understand these instructions.  Will watch your condition.  Will get help right away if you are not doing well or get worse. Document Released: 01/20/2008 Document Revised: 04/27/2012 Document Reviewed: 03/08/2012 [facility] Patient Information 2014 [place].

## 2013-10-19 NOTE — Progress Notes (Signed)
Pre-visit discussion using our clinic review tool. No additional management support is needed unless otherwise documented below in the visit note.  

## 2013-11-02 ENCOUNTER — Ambulatory Visit (INDEPENDENT_AMBULATORY_CARE_PROVIDER_SITE_OTHER): Payer: Managed Care, Other (non HMO) | Admitting: Internal Medicine

## 2013-11-02 ENCOUNTER — Encounter: Payer: Self-pay | Admitting: Internal Medicine

## 2013-11-02 VITALS — BP 112/80 | HR 75 | Temp 97.0°F | Wt 163.1 lb

## 2013-11-02 DIAGNOSIS — K219 Gastro-esophageal reflux disease without esophagitis: Secondary | ICD-10-CM

## 2013-11-02 DIAGNOSIS — R05 Cough: Secondary | ICD-10-CM | POA: Insufficient documentation

## 2013-11-02 DIAGNOSIS — F3289 Other specified depressive episodes: Secondary | ICD-10-CM

## 2013-11-02 DIAGNOSIS — F329 Major depressive disorder, single episode, unspecified: Secondary | ICD-10-CM

## 2013-11-02 DIAGNOSIS — R059 Cough, unspecified: Secondary | ICD-10-CM

## 2013-11-02 MED ORDER — HYDROCOD POLST-CHLORPHEN POLST 10-8 MG/5ML PO LQCR
5.0000 mL | Freq: Two times a day (BID) | ORAL | Status: DC | PRN
Start: 2013-11-02 — End: 2014-05-18

## 2013-11-02 MED ORDER — LEVOFLOXACIN 250 MG PO TABS: 250.0000 mg | ORAL_TABLET | Freq: Every day | ORAL | Status: DC

## 2013-11-02 NOTE — Progress Notes (Signed)
Pre visit review using our clinic review tool, if applicable. No additional management support is needed unless otherwise documented below in the visit note. 

## 2013-11-02 NOTE — Assessment & Plan Note (Signed)
stable overall by history and exam, and pt to continue medical treatment as before,  to f/u any worsening symptoms or concerns 

## 2013-11-02 NOTE — Assessment & Plan Note (Signed)
stable overall by history and exam, recent data reviewed with pt, and pt to continue medical treatment as before,  to f/u any worsening symptoms or concerns Lab Results  Component Value Date   WBC 6.7 08/08/2013   HGB 13.1 08/08/2013   HCT 39.9 08/08/2013   PLT 143* 08/08/2013   GLUCOSE 92 03/29/2013   CHOL 179 03/29/2013   TRIG 72.0 03/29/2013   HDL 41.30 03/29/2013   LDLCALC 123* 03/29/2013   ALT 18 03/29/2013   AST 19 03/29/2013   NA 137 03/29/2013   K 3.9 03/29/2013   CL 106 03/29/2013   CREATININE 0.5 03/29/2013   BUN 10 03/29/2013   CO2 29 03/29/2013   TSH 0.71 03/29/2013

## 2013-11-02 NOTE — Patient Instructions (Signed)
Please take all new medication as prescribed - the antibiotic, and cough medicine  Please continue all other medications as before, and refills have been done if requested. Please have the pharmacy call with any other refills you may need.  Please call if your cough does not improve, for chest xray to be done

## 2013-11-02 NOTE — Progress Notes (Signed)
Subjective:    Patient ID: [name], female    DOB: March 02, 1971, 43 y.o.   MRN: [id number]  HPI  Here with acute onset mild to mod 2 wks ST, HA, general weakness and malaise, with prod cough no sputum and myalgias, but Pt denies chest pain, increased sob or doe, wheezing, orthopnea, PND, increased LE swelling, palpitations, dizziness or syncope. Has some n/v, no diarrhea.  Pt denies new neurological symptoms such as facial or extremity weakness or numbness.   Pt denies polydipsia, polyuria,  Denies worsening reflux, abd pain, dysphagia, n/v, bowel change or blood.  Denies worsening depressive symptoms, suicidal ideation, or panic Past Medical History  Diagnosis Date  . ANXIETY 08/04/2007  . DEPRESSION 08/04/2007  . SINUSITIS- ACUTE-NOS 07/25/2010  . ALLERGIC RHINITIS 08/04/2007  . GERD 08/04/2007  . FATIGUE 08/04/2007  . Abdominal pain, left lower quadrant 03/26/2008  . ABDOMINAL PAIN, SUPRAPUBIC 08/04/2007  . SVD (spontaneous vaginal delivery)     x 4  . Insomnia   . Headache(784.0)   . Anemia   . Cough     instructed to see primary care if worsens-no fever   Past Surgical History  Procedure Laterality Date  . Wisdom tooth extraction    . Tubal ligation    . Rhinoplasty      nose  . Laparoscopy  03/23/2012    Procedure: LAPAROSCOPY OPERATIVE;  Surgeon: Margarette Asal, MD;  Location: [facility];  Service: Gynecology;  Laterality: N/A;  with CO2 laser ablation of Vin;need microscope,vaginal vault excision of keloid;will want kenolog 40 from pharmacy.  . Scar revision  03/23/2012    Procedure: SCAR REVISION;  Surgeon: Margarette Asal, MD;  Location: [facility];  Service: Gynecology;  Laterality: N/A;  . Abdominal hysterectomy  2004  . Laparotomy  05/02/2012    Procedure: EXPLORATORY LAPAROTOMY;  Surgeon: Margarette Asal, MD;  Location: [facility];  Service: Gynecology;  Laterality: N/A;  . Salpingoophorectomy  05/02/2012    Procedure: SALPINGO OOPHERECTOMY;  Surgeon: Margarette Asal, MD;   Location: [facility];  Service: Gynecology;  Laterality: Bilateral;  bilateral  . Scar revision N/A 08/08/2013    Procedure: SCAR REVISION ABDOMINAL;  Surgeon: Margarette Asal, MD;  Location: [facility];  Service: Gynecology;  Laterality: N/A;    reports that she has never smoked. She has never used smokeless tobacco. She reports that she does not drink alcohol or use illicit drugs. family history includes Asthma in her mother. Allergies  Allergen Reactions  . Latex Shortness Of Breath, Swelling and Rash  . Other Itching and Nausea And Vomiting    Pain medication given after surgery; pt doesn't remember name.  Most likely opioid-derived.   Current Outpatient Prescriptions on File Prior to Visit  Medication Sig Dispense Refill  . calcium carbonate (TUMS - DOSED IN MG ELEMENTAL CALCIUM) 500 MG chewable tablet Chew 2 tablets by mouth as needed for indigestion or heartburn.      . cyclobenzaprine (FLEXERIL) 5 MG tablet Take 5 mg by mouth 3 (three) times daily as needed for muscle spasms.      Marland Kitchen estradiol (VIVELLE-DOT) 0.1 MG/24HR patch Place 1 patch onto the skin 2 (two) times a week.      Marland Kitchen oxycodone-acetaminophen (PERCOCET) 2.5-325 MG per tablet Take 1 tablet by mouth every 4 (four) hours as needed for pain.  30 tablet  0  . zolpidem (AMBIEN) 5 MG tablet Take 5 mg by mouth at bedtime as needed. For sleep  No current facility-administered medications on file prior to visit.     Review of Systems  Constitutional: Negative for unexpected weight change, or unusual diaphoresis  HENT: Negative for tinnitus.   Eyes: Negative for photophobia and visual disturbance.  Respiratory: Negative for choking and stridor.   Gastrointestinal: Negative for vomiting and blood in stool.  Genitourinary: Negative for hematuria and decreased urine volume.  Musculoskeletal: Negative for acute joint swelling Skin: Negative for color change and wound.  Neurological: Negative for tremors and numbness other than  noted  Psychiatric/Behavioral: Negative for decreased concentration or  hyperactivity.       Objective:   Physical Exam BP 112/80  Pulse 75  Temp(Src) 97 F (36.1 C) (Oral)  Wt 163 lb 2 oz (73.993 kg)  SpO2 98% VS noted, mild ill Constitutional: Pt appears well-developed and well-nourished.  HENT: Head: NCAT.  Right Ear: External ear normal.  Left Ear: External ear normal.  Bilat tm's with mild erythema.  Max sinus areas non tender.  Pharynx with mild erythema, no exudate Eyes: Conjunctivae and EOM are normal. Pupils are equal, round, and reactive to light.  Neck: Normal range of motion. Neck supple.  Cardiovascular: Normal rate and regular rhythm.   Pulmonary/Chest: Effort normal and breath sounds without rales or wheezing.  Abd:  Soft, NT, non-distended, +BS Neurological: Pt is alert. Not confused  Skin: Skin is warm. No erythema.  Psychiatric: Pt behavior is normal. Thought content normal. not depressed affect, mild nervous    Assessment & Plan:

## 2013-11-02 NOTE — Assessment & Plan Note (Signed)
?   Atypical infection - Mild to mod, for antibx course,  to f/u any worsening symptoms or concerns

## 2014-02-14 LAB — HM MAMMOGRAPHY

## 2014-05-17 ENCOUNTER — Other Ambulatory Visit (INDEPENDENT_AMBULATORY_CARE_PROVIDER_SITE_OTHER): Payer: Managed Care, Other (non HMO)

## 2014-05-17 ENCOUNTER — Telehealth: Payer: Self-pay

## 2014-05-17 ENCOUNTER — Telehealth: Payer: Self-pay | Admitting: Internal Medicine

## 2014-05-17 DIAGNOSIS — Z0000 Encounter for general adult medical examination without abnormal findings: Secondary | ICD-10-CM

## 2014-05-17 LAB — CBC WITH DIFFERENTIAL/PLATELET
BASOS PCT: 0.5 % (ref 0.0–3.0)
Basophils Absolute: 0 10*3/uL (ref 0.0–0.1)
EOS ABS: 0.1 10*3/uL (ref 0.0–0.7)
Eosinophils Relative: 2 % (ref 0.0–5.0)
HCT: 36.2 % (ref 36.0–46.0)
Hemoglobin: 12.1 g/dL (ref 12.0–15.0)
Lymphocytes Relative: 34.6 % (ref 12.0–46.0)
Lymphs Abs: 2 10*3/uL (ref 0.7–4.0)
MCHC: 33.3 g/dL (ref 30.0–36.0)
MCV: 88.8 fl (ref 78.0–100.0)
Monocytes Absolute: 0.3 10*3/uL (ref 0.1–1.0)
Monocytes Relative: 5 % (ref 3.0–12.0)
Neutro Abs: 3.3 10*3/uL (ref 1.4–7.7)
Neutrophils Relative %: 57.9 % (ref 43.0–77.0)
PLATELETS: 251 10*3/uL (ref 150.0–400.0)
RBC: 4.08 Mil/uL (ref 3.87–5.11)
RDW: 14.1 % (ref 11.5–15.5)
WBC: 5.7 10*3/uL (ref 4.0–10.5)

## 2014-05-17 LAB — URINALYSIS, ROUTINE W REFLEX MICROSCOPIC
Bilirubin Urine: NEGATIVE
Hgb urine dipstick: NEGATIVE
KETONES UR: NEGATIVE
LEUKOCYTES UA: NEGATIVE
NITRITE: NEGATIVE
PH: 6.5 (ref 5.0–8.0)
SPECIFIC GRAVITY, URINE: 1.015 (ref 1.000–1.030)
Total Protein, Urine: NEGATIVE
Urine Glucose: NEGATIVE
Urobilinogen, UA: 0.2 (ref 0.0–1.0)

## 2014-05-17 LAB — BASIC METABOLIC PANEL
BUN: 15 mg/dL (ref 6–23)
CO2: 28 mEq/L (ref 19–32)
Calcium: 9.4 mg/dL (ref 8.4–10.5)
Chloride: 104 mEq/L (ref 96–112)
Creatinine, Ser: 0.5 mg/dL (ref 0.4–1.2)
GFR: 144.88 mL/min (ref >60.00)
Glucose, Bld: 89 mg/dL (ref 70–99)
POTASSIUM: 4 meq/L (ref 3.5–5.1)
SODIUM: 138 meq/L (ref 135–145)

## 2014-05-17 LAB — LIPID PANEL
Cholesterol: 187 mg/dL (ref 0–200)
HDL: 41.6 mg/dL (ref >39.00)
LDL Cholesterol: 135 mg/dL — ABNORMAL HIGH (ref 0–99)
NONHDL: 145.4
Total CHOL/HDL Ratio: 4
Triglycerides: 53 mg/dL (ref 0.0–149.0)
VLDL: 10.6 mg/dL (ref 0.0–40.0)

## 2014-05-17 LAB — HEPATIC FUNCTION PANEL
ALT: 16 U/L (ref 0–35)
AST: 18 U/L (ref 0–37)
Albumin: 4.2 g/dL (ref 3.5–5.2)
Alkaline Phosphatase: 59 U/L (ref 39–117)
BILIRUBIN TOTAL: 0.3 mg/dL (ref 0.2–1.2)
Bilirubin, Direct: 0.1 mg/dL (ref 0.0–0.3)
Total Protein: 7.6 g/dL (ref 6.0–8.3)

## 2014-05-17 LAB — TSH: TSH: 1.07 u[IU]/mL (ref 0.35–4.50)

## 2014-05-17 NOTE — Telephone Encounter (Signed)
Labs entered.

## 2014-05-17 NOTE — Telephone Encounter (Signed)
Pt is in lobby stating she has a CPE appt scheduled with Dr Jenny Reichmann tomorrow. She went downstairs to get blood work done and there are no orders in. Please advise.

## 2014-05-17 NOTE — Telephone Encounter (Signed)
Orders done

## 2014-05-18 ENCOUNTER — Encounter: Payer: Self-pay | Admitting: Internal Medicine

## 2014-05-18 ENCOUNTER — Ambulatory Visit (INDEPENDENT_AMBULATORY_CARE_PROVIDER_SITE_OTHER): Payer: Managed Care, Other (non HMO) | Admitting: Internal Medicine

## 2014-05-18 VITALS — BP 104/72 | HR 86 | Temp 98.3°F | Wt 169.5 lb

## 2014-05-18 DIAGNOSIS — J3089 Other allergic rhinitis: Secondary | ICD-10-CM

## 2014-05-18 DIAGNOSIS — Z0000 Encounter for general adult medical examination without abnormal findings: Secondary | ICD-10-CM

## 2014-05-18 DIAGNOSIS — E785 Hyperlipidemia, unspecified: Secondary | ICD-10-CM

## 2014-05-18 DIAGNOSIS — Z23 Encounter for immunization: Secondary | ICD-10-CM

## 2014-05-18 MED ORDER — CETIRIZINE HCL 10 MG PO TABS: 10.0000 mg | ORAL_TABLET | Freq: Every day | ORAL | Status: DC

## 2014-05-18 NOTE — Patient Instructions (Signed)
You had the flu shot today  Please take all new medication as prescribed - the zyrtec  You can also take OTC Delsym for cough as needed  Please continue all other medications as before, and refills have been done if requested.  Please have the pharmacy call with any other refills you may need.  Please continue your efforts at being more active, low cholesterol diet, and weight control.  You are otherwise up to date with prevention measures today.  Please keep your appointments with your specialists as you may have planned  Your Blood Work was OK today  Please return in 1 year for your yearly visit, or sooner if needed, with Lab testing done 3-5 days before

## 2014-05-18 NOTE — Progress Notes (Signed)
Subjective:    Patient ID: [name], female    DOB: March 12, 1971, 43 y.o.   MRN: [id number]  HPI  Here for wellness and f/u;  Overall doing ok;  Pt denies CP, worsening SOB, DOE, wheezing, orthopnea, PND, worsening LE edema, palpitations, dizziness or syncope.  Pt denies neurological change such as new headache, facial or extremity weakness.  Pt denies polydipsia, polyuria, or low sugar symptoms. Pt states overall good compliance with treatment and medications, good tolerability, and has been trying to follow lower cholesterol diet.  Pt denies worsening depressive symptoms, suicidal ideation or panic. No fever, night sweats, wt loss, loss of appetite, or other constitutional symptoms.  Pt states good ability with ADL's, has low fall risk, home safety reviewed and adequate, no other significant changes in hearing or vision, and only occasionally active with exercise.  Does have several wks ongoing nasal allergy symptoms with clearish congestion, itch and sneezing, without fever, pain, ST, cough, swelling or wheezing.  Past Medical History  Diagnosis Date  . ANXIETY 08/04/2007  . DEPRESSION 08/04/2007  . SINUSITIS- ACUTE-NOS 07/25/2010  . ALLERGIC RHINITIS 08/04/2007  . GERD 08/04/2007  . FATIGUE 08/04/2007  . Abdominal pain, left lower quadrant 03/26/2008  . ABDOMINAL PAIN, SUPRAPUBIC 08/04/2007  . SVD (spontaneous vaginal delivery)     x 4  . Insomnia   . Headache(784.0)   . Anemia   . Cough     instructed to see primary care if worsens-no fever   Past Surgical History  Procedure Laterality Date  . Wisdom tooth extraction    . Tubal ligation    . Rhinoplasty      nose  . Laparoscopy  03/23/2012    Procedure: LAPAROSCOPY OPERATIVE;  Surgeon: Margarette Asal, MD;  Location: [facility];  Service: Gynecology;  Laterality: N/A;  with CO2 laser ablation of Vin;need microscope,vaginal vault excision of keloid;will want kenolog 40 from pharmacy.  . Scar revision  03/23/2012    Procedure: SCAR  REVISION;  Surgeon: Margarette Asal, MD;  Location: [facility];  Service: Gynecology;  Laterality: N/A;  . Abdominal hysterectomy  2004  . Laparotomy  05/02/2012    Procedure: EXPLORATORY LAPAROTOMY;  Surgeon: Margarette Asal, MD;  Location: [facility];  Service: Gynecology;  Laterality: N/A;  . Salpingoophorectomy  05/02/2012    Procedure: SALPINGO OOPHERECTOMY;  Surgeon: Margarette Asal, MD;  Location: [facility];  Service: Gynecology;  Laterality: Bilateral;  bilateral  . Scar revision N/A 08/08/2013    Procedure: SCAR REVISION ABDOMINAL;  Surgeon: Margarette Asal, MD;  Location: [facility];  Service: Gynecology;  Laterality: N/A;    reports that she has never smoked. She has never used smokeless tobacco. She reports that she does not drink alcohol or use illicit drugs. family history includes Asthma in her mother. Allergies  Allergen Reactions  . Latex Shortness Of Breath, Swelling and Rash  . Other Itching and Nausea And Vomiting    Pain medication given after surgery; pt doesn't remember name.  Most likely opioid-derived.   No current outpatient prescriptions on file prior to visit.   No current facility-administered medications on file prior to visit.   Review of Systems Constitutional: Negative for increased diaphoresis, other activity, appetite or other siginficant weight change  HENT: Negative for worsening hearing loss, ear pain, facial swelling, mouth sores and neck stiffness.   Eyes: Negative for other worsening pain, redness or visual disturbance.  Respiratory: Negative for shortness of breath and wheezing.  Cardiovascular: Negative for chest pain and palpitations.  Gastrointestinal: Negative for diarrhea, blood in stool, abdominal distention or other pain Genitourinary: Negative for hematuria, flank pain or change in urine volume.  Musculoskeletal: Negative for myalgias or other joint complaints.  Skin: Negative for color change and wound.  Neurological: Negative for syncope and  numbness. other than noted Hematological: Negative for adenopathy. or other swelling Psychiatric/Behavioral: Negative for hallucinations, self-injury, decreased concentration or other worsening agitation.      Objective:   Physical Exam BP 104/72  Pulse 86  Temp(Src) 98.3 F (36.8 C) (Oral)  Wt 169 lb 8 oz (76.885 kg)  SpO2 96% VS noted,  Constitutional: Pt is oriented to person, place, and time. Appears well-developed and well-nourished.  Head: Normocephalic and atraumatic.  Right Ear: External ear normal.  Left Ear: External ear normal.  Nose: Nose normal.  Mouth/Throat: Oropharynx is clear and moist.  Eyes: Conjunctivae and EOM are normal. Pupils are equal, round, and reactive to light.  Neck: Normal range of motion. Neck supple. No JVD present. No tracheal deviation present.  Cardiovascular: Normal rate, regular rhythm, normal heart sounds and intact distal pulses.   Pulmonary/Chest: Effort normal and breath sounds without rales or wheezing  Abdominal: Soft. Bowel sounds are normal. NT. No HSM  Musculoskeletal: Normal range of motion. Exhibits no edema.  Lymphadenopathy:  Has no cervical adenopathy.  Neurological: Pt is alert and oriented to person, place, and time. Pt has normal reflexes. No cranial nerve deficit. Motor grossly intact Skin: Skin is warm and dry. No rash noted.  Psychiatric:  Has normal mood and affect. Behavior is normal.     Assessment & Plan:

## 2014-05-18 NOTE — Progress Notes (Signed)
Pre visit review using our clinic review tool, if applicable. No additional management support is needed unless otherwise documented below in the visit note. 

## 2014-05-20 ENCOUNTER — Encounter: Payer: Self-pay | Admitting: Internal Medicine

## 2014-05-20 DIAGNOSIS — E785 Hyperlipidemia, unspecified: Secondary | ICD-10-CM

## 2014-05-20 HISTORY — DX: Hyperlipidemia, unspecified: E78.5

## 2014-05-20 NOTE — Assessment & Plan Note (Signed)
For zyrtec prn

## 2014-05-20 NOTE — Assessment & Plan Note (Signed)
Lab Results  Component Value Date   LDLCALC 135* 05/17/2014   declnies statin, for lower chol diet

## 2014-05-20 NOTE — Assessment & Plan Note (Signed)

## 2014-08-07 ENCOUNTER — Encounter: Payer: Self-pay | Admitting: Internal Medicine

## 2014-08-07 ENCOUNTER — Ambulatory Visit (INDEPENDENT_AMBULATORY_CARE_PROVIDER_SITE_OTHER): Payer: Managed Care, Other (non HMO) | Admitting: Internal Medicine

## 2014-08-07 ENCOUNTER — Ambulatory Visit (INDEPENDENT_AMBULATORY_CARE_PROVIDER_SITE_OTHER)
Admission: RE | Admit: 2014-08-07 | Discharge: 2014-08-07 | Disposition: A | Discharge status: Home / Self Care | Payer: Managed Care, Other (non HMO) | Source: Ambulatory Visit | Attending: Internal Medicine | Admitting: Internal Medicine

## 2014-08-07 VITALS — BP 110/70 | HR 101 | Temp 97.9°F | Ht 67.0 in | Wt 169.2 lb

## 2014-08-07 DIAGNOSIS — J209 Acute bronchitis, unspecified: Secondary | ICD-10-CM

## 2014-08-07 DIAGNOSIS — F32A Depression, unspecified: Secondary | ICD-10-CM

## 2014-08-07 DIAGNOSIS — F329 Major depressive disorder, single episode, unspecified: Secondary | ICD-10-CM

## 2014-08-07 DIAGNOSIS — R0989 Other specified symptoms and signs involving the circulatory and respiratory systems: Secondary | ICD-10-CM

## 2014-08-07 DIAGNOSIS — R0689 Other abnormalities of breathing: Secondary | ICD-10-CM | POA: Insufficient documentation

## 2014-08-07 MED ORDER — LEVOFLOXACIN 500 MG PO TABS: 500.0000 mg | ORAL_TABLET | Freq: Every day | ORAL | Status: DC

## 2014-08-07 MED ORDER — HYDROCOD POLST-CHLORPHEN POLST 10-8 MG/5ML PO LQCR: 5.0000 mL | Freq: Two times a day (BID) | ORAL | Status: DC | PRN

## 2014-08-07 NOTE — Progress Notes (Signed)
Pre visit review using our clinic review tool, if applicable. No additional management support is needed unless otherwise documented below in the visit note. 

## 2014-08-07 NOTE — Patient Instructions (Signed)
Please take all new medication as prescribed - the antibiotic, and cough medicine  Please continue all other medications as before, and refills have been done if requested.  Please have the pharmacy call with any other refills you may need.  Please keep your appointments with your specialists as you may have planned  Please go to the XRAY Department in the Basement (go straight as you get off the elevator) for the x-ray testing  You will be contacted by phone if any changes need to be made immediately.  Otherwise, you will receive a letter about your results with an explanation, but please check with MyChart first.  Please remember to sign up for MyChart if you have not done so, as this will be important to you in the future with finding out test results, communicating by private email, and scheduling acute appointments online when needed.

## 2014-08-16 DIAGNOSIS — J209 Acute bronchitis, unspecified: Secondary | ICD-10-CM | POA: Insufficient documentation

## 2014-08-16 NOTE — Assessment & Plan Note (Signed)
Mild to mod, for antibx course,  to f/u any worsening symptoms or concerns 

## 2014-08-16 NOTE — Assessment & Plan Note (Signed)
Cant r/o pna - for cxr, cough med prn,  to f/u any worsening symptoms or concerns

## 2014-08-16 NOTE — Progress Notes (Signed)
Subjective:    Patient ID: [name], female    DOB: Apr 19, 1971, 43 y.o.   MRN: [id number]  HPI  Here with acute onset mild to mod 2-3 days ST, HA, general weakness and malaise, with prod cough greenish sputum, but Pt denies chest pain, increased sob or doe, wheezing, orthopnea, PND, increased LE swelling, palpitations, dizziness or syncope.  Pt denies new neurological symptoms such as new headache, or facial or extremity weakness or numbness   Pt denies polydipsia, polyuria, Denies worsening depressive symptoms, suicidal ideation, or panic Past Medical History  Diagnosis Date  . ANXIETY 08/04/2007  . DEPRESSION 08/04/2007  . SINUSITIS- ACUTE-NOS 07/25/2010  . ALLERGIC RHINITIS 08/04/2007  . GERD 08/04/2007  . FATIGUE 08/04/2007  . Abdominal pain, left lower quadrant 03/26/2008  . ABDOMINAL PAIN, SUPRAPUBIC 08/04/2007  . SVD (spontaneous vaginal delivery)     x 4  . Insomnia   . Headache(784.0)   . Anemia   . Cough     instructed to see primary care if worsens-no fever  . Hyperlipidemia 05/20/2014   Past Surgical History  Procedure Laterality Date  . Wisdom tooth extraction    . Tubal ligation    . Rhinoplasty      nose  . Laparoscopy  03/23/2012    Procedure: LAPAROSCOPY OPERATIVE;  Surgeon: Margarette Asal, MD;  Location: [facility];  Service: Gynecology;  Laterality: N/A;  with CO2 laser ablation of Vin;need microscope,vaginal vault excision of keloid;will want kenolog 40 from pharmacy.  . Scar revision  03/23/2012    Procedure: SCAR REVISION;  Surgeon: Margarette Asal, MD;  Location: [facility];  Service: Gynecology;  Laterality: N/A;  . Abdominal hysterectomy  2004  . Laparotomy  05/02/2012    Procedure: EXPLORATORY LAPAROTOMY;  Surgeon: Margarette Asal, MD;  Location: [facility];  Service: Gynecology;  Laterality: N/A;  . Salpingoophorectomy  05/02/2012    Procedure: SALPINGO OOPHERECTOMY;  Surgeon: Margarette Asal, MD;  Location: [facility];  Service: Gynecology;  Laterality: Bilateral;   bilateral  . Scar revision N/A 08/08/2013    Procedure: SCAR REVISION ABDOMINAL;  Surgeon: Margarette Asal, MD;  Location: [facility];  Service: Gynecology;  Laterality: N/A;    reports that she has never smoked. She has never used smokeless tobacco. She reports that she does not drink alcohol or use illicit drugs. family history includes Asthma in her mother. Allergies  Allergen Reactions  . Latex Shortness Of Breath, Swelling and Rash  . Other Itching and Nausea And Vomiting    Pain medication given after surgery; pt doesn't remember name.  Most likely opioid-derived.   Current Outpatient Prescriptions on File Prior to Visit  Medication Sig Dispense Refill  . cetirizine (ZYRTEC) 10 MG tablet Take 1 tablet (10 mg total) by mouth daily. 90 tablet 3   No current facility-administered medications on file prior to visit.   Review of Systems  Constitutional: Negative for unusual diaphoresis or other sweats  HENT: Negative for ringing in ear Eyes: Negative for double vision or worsening visual disturbance.  Respiratory: Negative for choking and stridor.   Gastrointestinal: Negative for vomiting or other signifcant bowel change Genitourinary: Negative for hematuria or decreased urine volume.  Musculoskeletal: Negative for other MSK pain or swelling Skin: Negative for color change and worsening wound.  Neurological: Negative for tremors and numbness other than noted  Psychiatric/Behavioral: Negative for decreased concentration or agitation other than above       Objective:   Physical Exam  BP 110/70 mmHg  Pulse 101  Temp(Src) 97.9 F (36.6 C) (Oral)  Ht 5\' 7"  (1.702 m)  Wt 169 lb 4 oz (76.771 kg)  BMI 26.50 kg/m2  SpO2 92% VS noted, mild ill Constitutional: Pt appears well-developed, well-nourished.  HENT: Head: NCAT.  Right Ear: External ear normal.  Left Ear: External ear normal.  Eyes: . Pupils are equal, round, and reactive to light. Conjunctivae and EOM are normal Bilat  tm's with mild erythema.  Max sinus areas mil tender.  Pharynx with mild erythema, no exudate Neck: Normal range of motion. Neck supple.  Cardiovascular: Normal rate and regular rhythm.   Pulmonary/Chest: Effort normal and breath sounds without wheezing but with few LLL rales.  Neurological: Pt is alert. Not confused , motor grossly intact Skin: Skin is warm. No rash Psychiatric: Pt behavior is normal. No agitation.     Assessment & Plan:

## 2014-08-16 NOTE — Assessment & Plan Note (Signed)
stable overall by history and exam, recent data reviewed with pt, and pt to continue medical treatment as before,  to f/u any worsening symptoms or concerns Lab Results  Component Value Date   WBC 5.7 05/17/2014   HGB 12.1 05/17/2014   HCT 36.2 05/17/2014   PLT 251.0 05/17/2014   GLUCOSE 89 05/17/2014   CHOL 187 05/17/2014   TRIG 53.0 05/17/2014   HDL 41.60 05/17/2014   LDLCALC 135* 05/17/2014   ALT 16 05/17/2014   AST 18 05/17/2014   NA 138 05/17/2014   K 4.0 05/17/2014   CL 104 05/17/2014   CREATININE 0.5 05/17/2014   BUN 15 05/17/2014   CO2 28 05/17/2014   TSH 1.07 05/17/2014

## 2015-03-18 ENCOUNTER — Other Ambulatory Visit: Payer: Self-pay | Admitting: Obstetrics and Gynecology

## 2015-03-19 LAB — CYTOLOGY - PAP

## 2015-05-21 ENCOUNTER — Ambulatory Visit: Payer: Managed Care, Other (non HMO) | Admitting: Internal Medicine

## 2015-06-11 ENCOUNTER — Other Ambulatory Visit (INDEPENDENT_AMBULATORY_CARE_PROVIDER_SITE_OTHER): Payer: BLUE CROSS/BLUE SHIELD

## 2015-06-11 DIAGNOSIS — Z0000 Encounter for general adult medical examination without abnormal findings: Secondary | ICD-10-CM | POA: Diagnosis not present

## 2015-06-11 LAB — HEPATIC FUNCTION PANEL
ALK PHOS: 69 U/L (ref 39–117)
ALT: 18 U/L (ref 0–35)
AST: 18 U/L (ref 0–37)
Albumin: 4 g/dL (ref 3.5–5.2)
BILIRUBIN DIRECT: 0.1 mg/dL (ref 0.0–0.3)
TOTAL PROTEIN: 7.5 g/dL (ref 6.0–8.3)
Total Bilirubin: 0.4 mg/dL (ref 0.2–1.2)

## 2015-06-11 LAB — BASIC METABOLIC PANEL
BUN: 14 mg/dL (ref 6–23)
CO2: 30 meq/L (ref 19–32)
Calcium: 9.7 mg/dL (ref 8.4–10.5)
Chloride: 104 mEq/L (ref 96–112)
Creatinine, Ser: 0.56 mg/dL (ref 0.40–1.20)
GFR: 123.61 mL/min (ref >60.00)
Glucose, Bld: 97 mg/dL (ref 70–99)
Potassium: 3.9 mEq/L (ref 3.5–5.1)
SODIUM: 141 meq/L (ref 135–145)

## 2015-06-11 LAB — CBC WITH DIFFERENTIAL/PLATELET
BASOS ABS: 0 10*3/uL (ref 0.0–0.1)
BASOS PCT: 0.7 % (ref 0.0–3.0)
Eosinophils Absolute: 0.1 10*3/uL (ref 0.0–0.7)
Eosinophils Relative: 1.6 % (ref 0.0–5.0)
HCT: 39.9 % (ref 36.0–46.0)
Hemoglobin: 13.3 g/dL (ref 12.0–15.0)
LYMPHS ABS: 2.4 10*3/uL (ref 0.7–4.0)
LYMPHS PCT: 42 % (ref 12.0–46.0)
MCHC: 33.2 g/dL (ref 30.0–36.0)
MCV: 86.6 fl (ref 78.0–100.0)
MONOS PCT: 5 % (ref 3.0–12.0)
Monocytes Absolute: 0.3 10*3/uL (ref 0.1–1.0)
NEUTROS PCT: 50.7 % (ref 43.0–77.0)
Neutro Abs: 2.9 10*3/uL (ref 1.4–7.7)
PLATELETS: 242 10*3/uL (ref 150.0–400.0)
RBC: 4.61 Mil/uL (ref 3.87–5.11)
RDW: 13.4 % (ref 11.5–15.5)
WBC: 5.7 10*3/uL (ref 4.0–10.5)

## 2015-06-11 LAB — LIPID PANEL
CHOL/HDL RATIO: 5
Cholesterol: 192 mg/dL (ref 0–200)
HDL: 39.6 mg/dL (ref >39.00)
LDL Cholesterol: 131 mg/dL — ABNORMAL HIGH (ref 0–99)
NONHDL: 152.45
Triglycerides: 109 mg/dL (ref 0.0–149.0)
VLDL: 21.8 mg/dL (ref 0.0–40.0)

## 2015-06-11 LAB — URINALYSIS, ROUTINE W REFLEX MICROSCOPIC
BILIRUBIN URINE: NEGATIVE
Hgb urine dipstick: NEGATIVE
Ketones, ur: NEGATIVE
Nitrite: NEGATIVE
RBC / HPF: NONE SEEN (ref 0–?)
SPECIFIC GRAVITY, URINE: 1.02 (ref 1.000–1.030)
TOTAL PROTEIN, URINE-UPE24: NEGATIVE
Urine Glucose: NEGATIVE
Urobilinogen, UA: 0.2 (ref 0.0–1.0)
pH: 8 (ref 5.0–8.0)

## 2015-06-11 LAB — TSH: TSH: 0.51 u[IU]/mL (ref 0.35–4.50)

## 2015-06-12 ENCOUNTER — Ambulatory Visit (INDEPENDENT_AMBULATORY_CARE_PROVIDER_SITE_OTHER): Payer: BLUE CROSS/BLUE SHIELD | Admitting: Internal Medicine

## 2015-06-12 ENCOUNTER — Encounter: Payer: Self-pay | Admitting: Internal Medicine

## 2015-06-12 VITALS — BP 108/66 | HR 72 | Temp 97.9°F | Ht 67.0 in | Wt 172.4 lb

## 2015-06-12 DIAGNOSIS — Z23 Encounter for immunization: Secondary | ICD-10-CM | POA: Diagnosis not present

## 2015-06-12 DIAGNOSIS — Z0000 Encounter for general adult medical examination without abnormal findings: Secondary | ICD-10-CM

## 2015-06-12 NOTE — Progress Notes (Signed)
Subjective:    Patient ID: [name], female    DOB: 06-19-1971, 44 y.o.   MRN: [id number]  HPI  Here for wellness and f/u;  Overall doing ok;  Pt denies Chest pain, worsening SOB, DOE, wheezing, orthopnea, PND, worsening LE edema, palpitations, dizziness or syncope.  Pt denies neurological change such as new headache, facial or extremity weakness.  Pt denies polydipsia, polyuria, or low sugar symptoms. Pt states overall good compliance with treatment and medications, good tolerability, and has been trying to follow appropriate diet.  Pt denies worsening depressive symptoms, suicidal ideation or panic. No fever, night sweats, wt loss, loss of appetite, or other constitutional symptoms.  Pt states good ability with ADL's, has low fall risk, home safety reviewed and adequate, no other significant changes in hearing or vision, and only occasionally active with exercise. Also with recurring spot erythema to face, no pain or itching, has several to right face x 1 wk, has not seen dermatology. Also wtth lower appetite last few days. Also has a knot to the PIP of 5th finger left hand with occas pain, but does not want hand surgeon referral for now, also fell 10 yrs ago to left elbow with intermittent numbness of hand, ? More freq recently, again defers eval for now.  Past Medical History  Diagnosis Date  . ANXIETY 08/04/2007  . DEPRESSION 08/04/2007  . SINUSITIS- ACUTE-NOS 07/25/2010  . ALLERGIC RHINITIS 08/04/2007  . GERD 08/04/2007  . FATIGUE 08/04/2007  . Abdominal pain, left lower quadrant 03/26/2008  . ABDOMINAL PAIN, SUPRAPUBIC 08/04/2007  . SVD (spontaneous vaginal delivery)     x 4  . Insomnia   . Headache(784.0)   . Anemia   . Cough     instructed to see primary care if worsens-no fever  . Hyperlipidemia 05/20/2014   Past Surgical History  Procedure Laterality Date  . Wisdom tooth extraction    . Tubal ligation    . Rhinoplasty      nose  . Laparoscopy  03/23/2012    Procedure:  LAPAROSCOPY OPERATIVE;  Surgeon: Margarette Asal, MD;  Location: [facility];  Service: Gynecology;  Laterality: N/A;  with CO2 laser ablation of Vin;need microscope,vaginal vault excision of keloid;will want kenolog 40 from pharmacy.  . Scar revision  03/23/2012    Procedure: SCAR REVISION;  Surgeon: Margarette Asal, MD;  Location: [facility];  Service: Gynecology;  Laterality: N/A;  . Abdominal hysterectomy  2004  . Laparotomy  05/02/2012    Procedure: EXPLORATORY LAPAROTOMY;  Surgeon: Margarette Asal, MD;  Location: [facility];  Service: Gynecology;  Laterality: N/A;  . Salpingoophorectomy  05/02/2012    Procedure: SALPINGO OOPHERECTOMY;  Surgeon: Margarette Asal, MD;  Location: [facility];  Service: Gynecology;  Laterality: Bilateral;  bilateral  . Scar revision N/A 08/08/2013    Procedure: SCAR REVISION ABDOMINAL;  Surgeon: Margarette Asal, MD;  Location: [facility];  Service: Gynecology;  Laterality: N/A;    reports that she has never smoked. She has never used smokeless tobacco. She reports that she does not drink alcohol or use illicit drugs. family history includes Asthma in her mother. Allergies  Allergen Reactions  . Latex Shortness Of Breath, Swelling and Rash  . Other Itching and Nausea And Vomiting    Pain medication given after surgery; pt doesn't remember name.  Most likely opioid-derived.   Current Outpatient Prescriptions on File Prior to Visit  Medication Sig Dispense Refill  . cetirizine (ZYRTEC) 10 MG tablet  Take 1 tablet (10 mg total) by mouth daily. (Patient not taking: Reported on 06/12/2015) 90 tablet 3  . chlorpheniramine-HYDROcodone (TUSSIONEX PENNKINETIC ER) 10-8 MG/5ML LQCR Take 5 mLs by mouth every 12 (twelve) hours as needed for cough. (Patient not taking: Reported on 06/12/2015) 115 mL 0  . levofloxacin (LEVAQUIN) 500 MG tablet Take 1 tablet (500 mg total) by mouth daily. (Patient not taking: Reported on 06/12/2015) 10 tablet 0   No current facility-administered medications  on file prior to visit.   Review of Systems Constitutional: Negative for increased diaphoresis, other activity, appetite or siginficant weight change other than noted HENT: Negative for worsening hearing loss, ear pain, facial swelling, mouth sores and neck stiffness.   Eyes: Negative for other worsening pain, redness or visual disturbance.  Respiratory: Negative for shortness of breath and wheezing  Cardiovascular: Negative for chest pain and palpitations.  Gastrointestinal: Negative for diarrhea, blood in stool, abdominal distention or other pain Genitourinary: Negative for hematuria, flank pain or change in urine volume.  Musculoskeletal: Negative for myalgias or other joint complaints.  Skin: Negative for color change and wound or drainage.  Neurological: Negative for syncope and numbness. other than noted Hematological: Negative for adenopathy. or other swelling Psychiatric/Behavioral: Negative for hallucinations, SI, self-injury, decreased concentration or other worsening agitation.      Objective:   Physical Exam BP 108/66 mmHg  Pulse 72  Temp(Src) 97.9 F (36.6 C) (Oral)  Ht 5\' 7"  (1.702 m)  Wt 172 lb 6.4 oz (78.2 kg)  BMI 27.00 kg/m2  SpO2 98% VS noted,  Constitutional: Pt is oriented to person, place, and time. Appears well-developed and well-nourished, in no significant distress Head: Normocephalic and atraumatic.  Right Ear: External ear normal.  Left Ear: External ear normal.  Nose: Nose normal.  Mouth/Throat: Oropharynx is clear and moist.  Eyes: Conjunctivae and EOM are normal. Pupils are equal, round, and reactive to light.  Neck: Normal range of motion. Neck supple. No JVD present. No tracheal deviation present or significant neck LA or mass Cardiovascular: Normal rate, regular rhythm, normal heart sounds and intact distal pulses.   Pulmonary/Chest: Effort normal and breath sounds without rales or wheezing  Abdominal: Soft. Bowel sounds are normal. NT. No HSM    Musculoskeletal: Normal range of motion. Exhibits no edema.  Lymphadenopathy:  Has no cervical adenopathy.  Neurological: Pt is alert and oriented to person, place, and time. Pt has normal reflexes. No cranial nerve deficit. Motor grossly intact Skin: Skin is warm and dry. No rash noted.  Psychiatric:  Has normal mood and affect. Behavior is normal.     Assessment & Plan:

## 2015-06-12 NOTE — Assessment & Plan Note (Signed)

## 2015-06-12 NOTE — Patient Instructions (Addendum)

## 2015-10-22 IMAGING — CR DG CHEST 2V
2 series · 2 of 2 positions shown · non-contrast
Comparison: 11/20/2010

CLINICAL DATA: Cough for 3 weeks, fever, decreased breath sounds
RIGHT lower lobe

EXAM:
CHEST  2 VIEW

[view not recorded (1 of 2)]
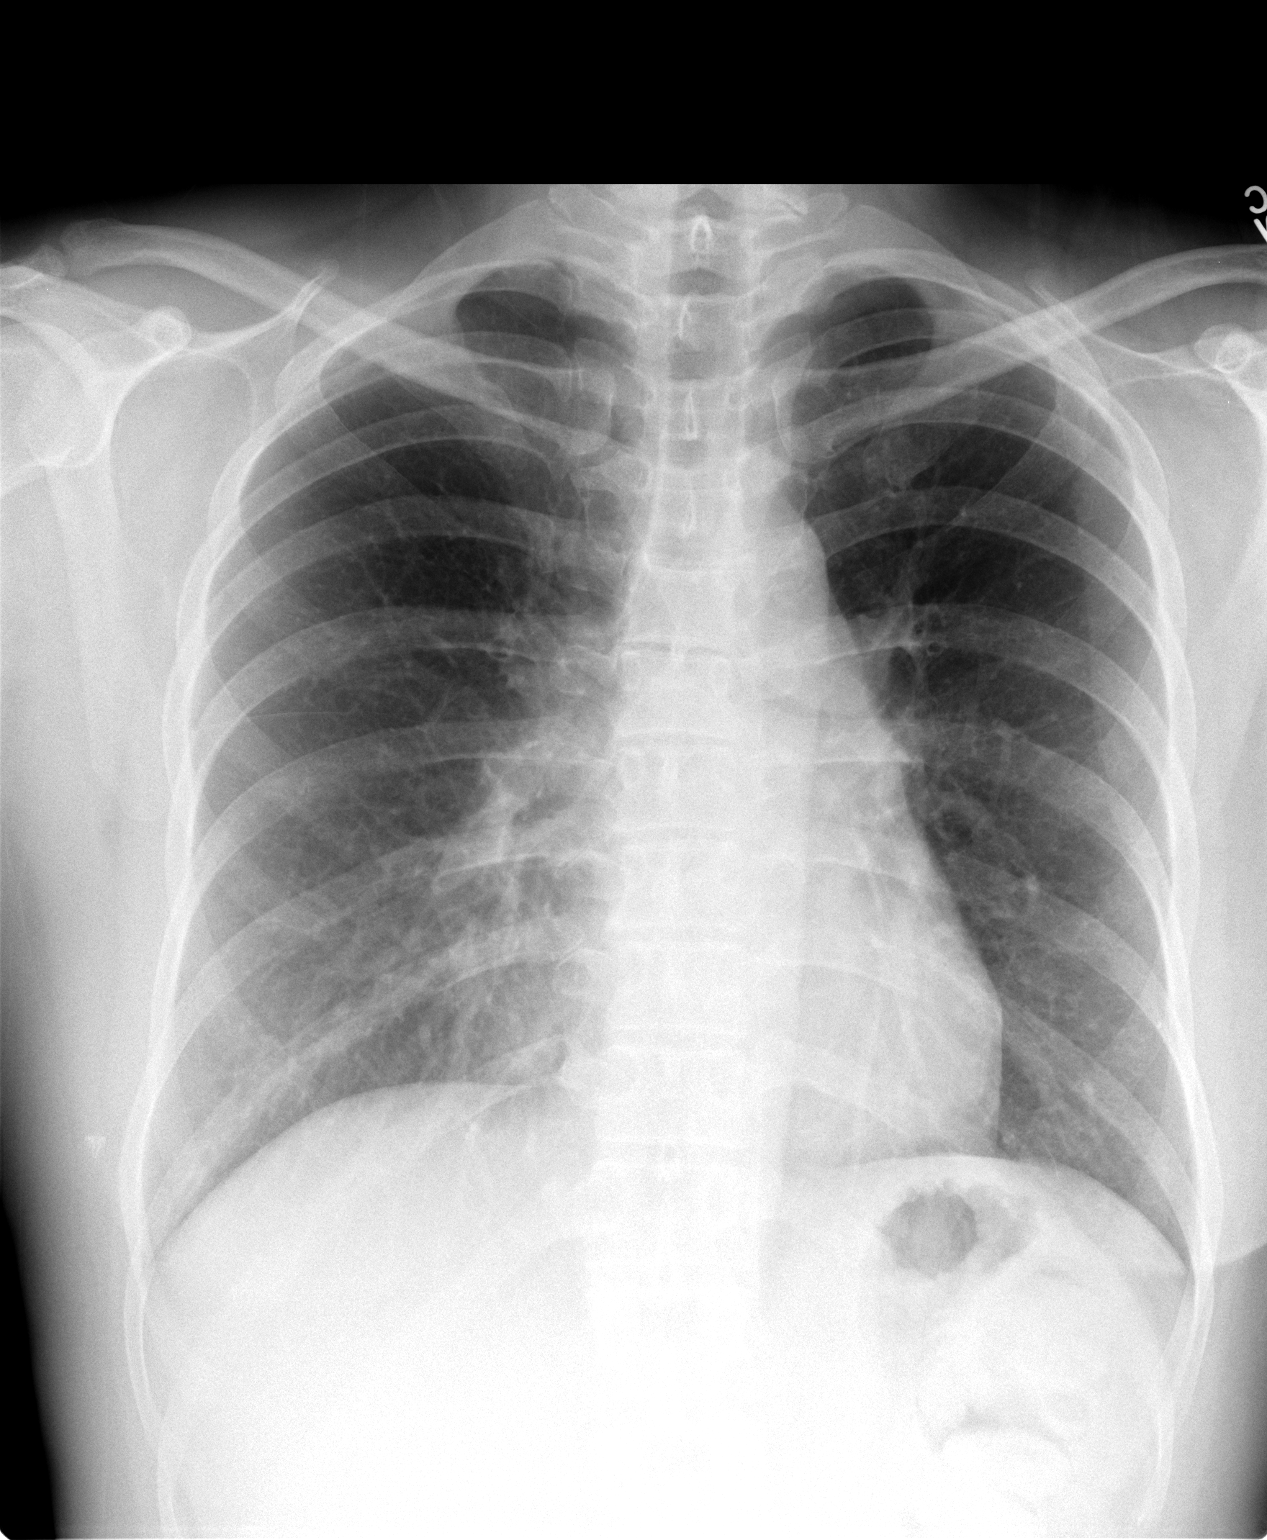

[view not recorded (2 of 2)]
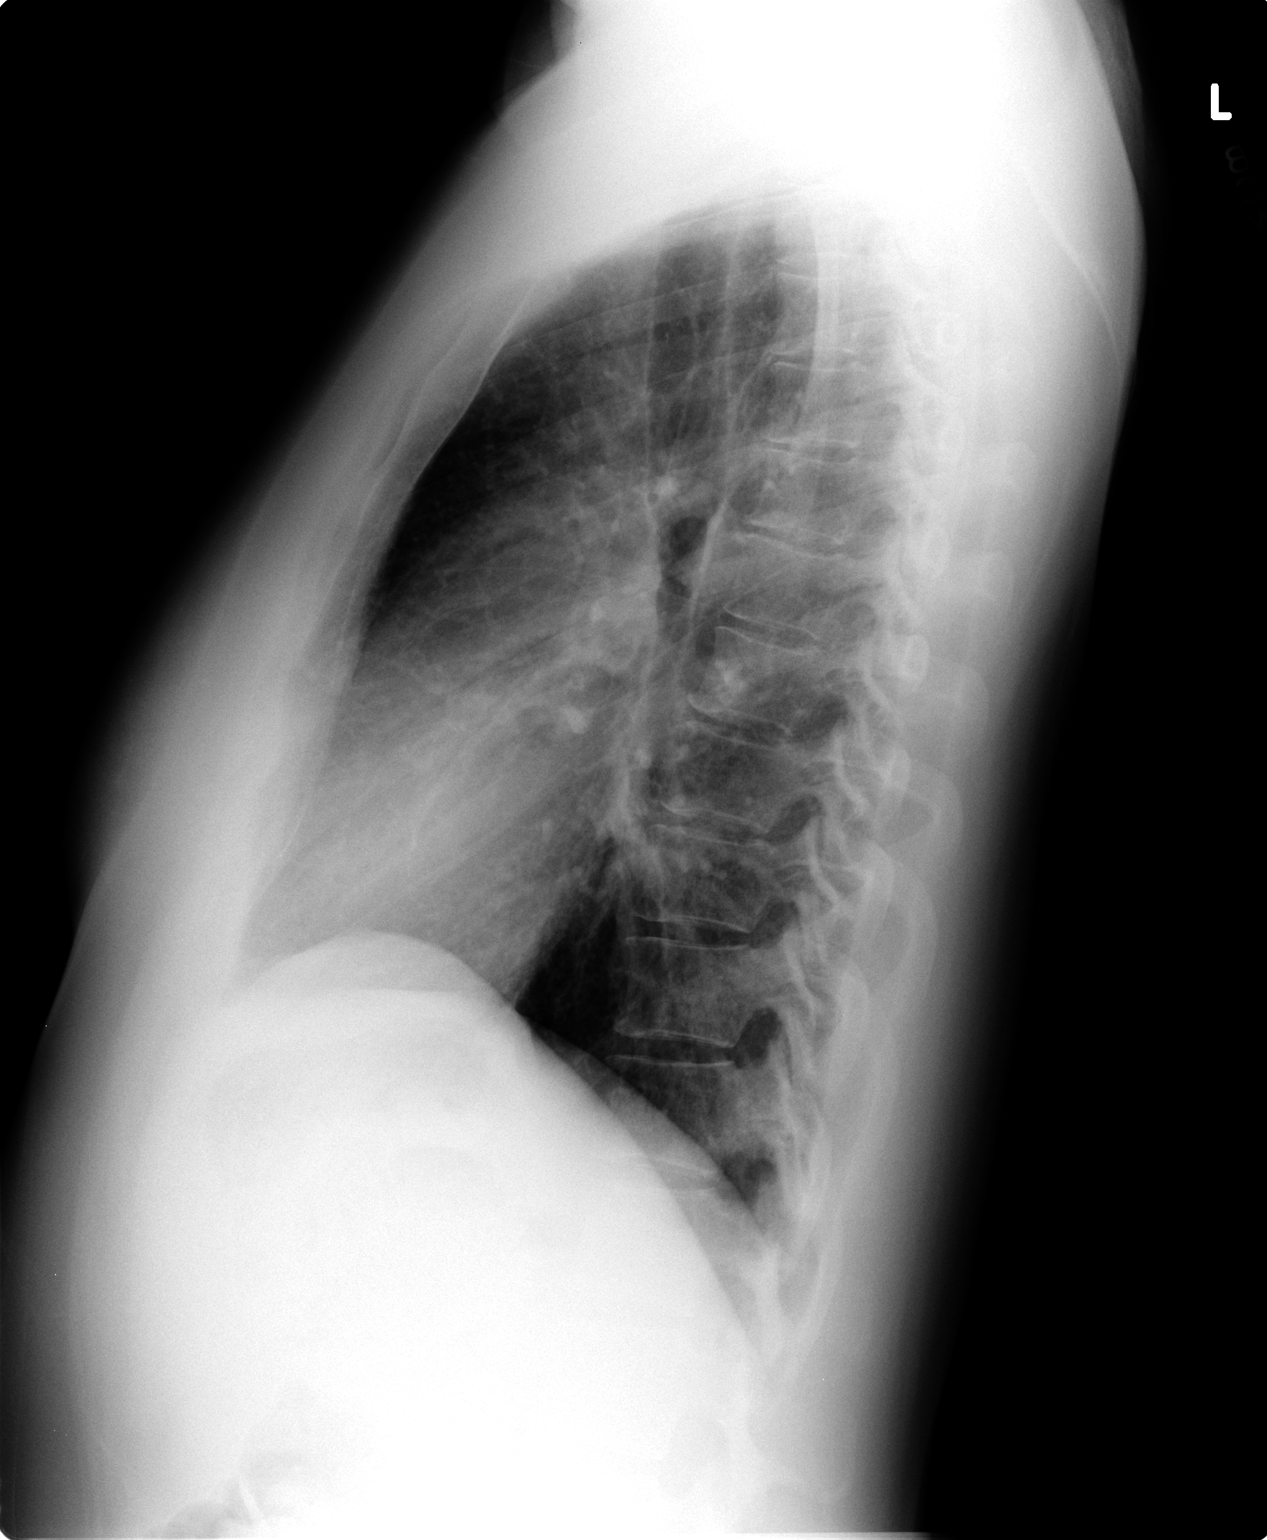

[2 of 2 positions shown; findings below may reference images not displayed]

FINDINGS: Normal heart size, mediastinal contours and pulmonary vascularity.

Minimal bronchitic changes.

Chronic accentuation of RIGHT middle lobe markings stable.

No acute infiltrate, pleural effusion or pneumothorax.

Bones unremarkable.
IMPRESSION: Minimal bronchitic changes without infiltrate.

## 2015-11-27 ENCOUNTER — Encounter: Payer: Self-pay | Admitting: Internal Medicine

## 2015-11-27 ENCOUNTER — Ambulatory Visit (INDEPENDENT_AMBULATORY_CARE_PROVIDER_SITE_OTHER): Payer: BLUE CROSS/BLUE SHIELD | Admitting: Internal Medicine

## 2015-11-27 VITALS — BP 118/78 | HR 91 | Temp 98.8°F | Resp 20 | Wt 174.0 lb

## 2015-11-27 DIAGNOSIS — J3089 Other allergic rhinitis: Secondary | ICD-10-CM

## 2015-11-27 DIAGNOSIS — M79601 Pain in right arm: Secondary | ICD-10-CM | POA: Insufficient documentation

## 2015-11-27 DIAGNOSIS — F329 Major depressive disorder, single episode, unspecified: Secondary | ICD-10-CM

## 2015-11-27 DIAGNOSIS — F32A Depression, unspecified: Secondary | ICD-10-CM

## 2015-11-27 MED ORDER — PREDNISONE 10 MG PO TABS: ORAL_TABLET | ORAL | Status: DC

## 2015-11-27 MED ORDER — TRAMADOL HCL 50 MG PO TABS: 50.0000 mg | ORAL_TABLET | Freq: Four times a day (QID) | ORAL | Status: DC | PRN

## 2015-11-27 MED ORDER — AZITHROMYCIN 250 MG PO TABS: ORAL_TABLET | ORAL | Status: DC

## 2015-11-27 NOTE — Assessment & Plan Note (Addendum)
Hx c/w prob insect sting to start, had immed swelling locally, then right whole arm pain and swelling the next day, worse to the deltoid area, now overall improved but with persistent pain/swelling/bruising at initial presumed insect sting site, also mod tender to right deltoid area as well;  Also c/w prob angioedema/delayed allergic rxn to  RUE but cant r/o element of cellulitis or even DVT; pt declines RUE venous doppler due to cost of deductible with her insurance, but will go to ER for any CP or sob, so for depomedrol IM today, predpac and zpack asd, pain control asd,  to f/u any worsening symptoms or concerns

## 2015-11-27 NOTE — Progress Notes (Signed)
Subjective:    Patient ID: [name], female    DOB: Sep 20, 1970, 45 y.o.   MRN: [id number]  HPI  Here to f/u, c/o predicament x 5 days to right upper arm, was working in yard and believes she had an insect sting to right medial upper arm, never saw the insect but had markedly localized swelling/itching/pain within a few hrs, but over 50% improved by the next day.  Has scratched at is occas, has developed bruising at the site but then had onset of gradual worsening right lateral arm/shoulder pain/swelling over the last 3 days, with whole arm now swollen but worst at the right deltoid/subachromial area.  Cannot lie to sleep on right side and worse to forward elevated and abduct the arm, though can still do so slowly due to the discomfort.  No fever, other redness, rash or skin change. Pt denies chest pain, increased sob or doe, wheezing, orthopnea, PND, increased LE swelling, palpitations, dizziness or syncope. Pt denies new neurological symptoms such as new headache, or facial or extremity weakness or numbness   Pt denies polydipsia, polyuria  Symptoms have overall improved in the last day since she made the appt. Denies worsening depressive symptoms, suicidal ideation, or panic. Does have several wks ongoing nasal allergy symptoms with clearish congestion, itch and sneezing.  Past Medical History  Diagnosis Date  . ANXIETY 08/04/2007  . DEPRESSION 08/04/2007  . SINUSITIS- ACUTE-NOS 07/25/2010  . ALLERGIC RHINITIS 08/04/2007  . GERD 08/04/2007  . FATIGUE 08/04/2007  . Abdominal pain, left lower quadrant 03/26/2008  . ABDOMINAL PAIN, SUPRAPUBIC 08/04/2007  . SVD (spontaneous vaginal delivery)     x 4  . Insomnia   . Headache(784.0)   . Anemia   . Cough     instructed to see primary care if worsens-no fever  . Hyperlipidemia 05/20/2014   Past Surgical History  Procedure Laterality Date  . Wisdom tooth extraction    . Tubal ligation    . Rhinoplasty      nose  . Laparoscopy  03/23/2012   Procedure: LAPAROSCOPY OPERATIVE;  Surgeon: Margarette Asal, MD;  Location: [facility];  Service: Gynecology;  Laterality: N/A;  with CO2 laser ablation of Vin;need microscope,vaginal vault excision of keloid;will want kenolog 40 from pharmacy.  . Scar revision  03/23/2012    Procedure: SCAR REVISION;  Surgeon: Margarette Asal, MD;  Location: [facility];  Service: Gynecology;  Laterality: N/A;  . Abdominal hysterectomy  2004  . Laparotomy  05/02/2012    Procedure: EXPLORATORY LAPAROTOMY;  Surgeon: Margarette Asal, MD;  Location: [facility];  Service: Gynecology;  Laterality: N/A;  . Salpingoophorectomy  05/02/2012    Procedure: SALPINGO OOPHERECTOMY;  Surgeon: Margarette Asal, MD;  Location: [facility];  Service: Gynecology;  Laterality: Bilateral;  bilateral  . Scar revision N/A 08/08/2013    Procedure: SCAR REVISION ABDOMINAL;  Surgeon: Margarette Asal, MD;  Location: [facility];  Service: Gynecology;  Laterality: N/A;    reports that she has never smoked. She has never used smokeless tobacco. She reports that she does not drink alcohol or use illicit drugs. family history includes Asthma in her mother. Allergies  Allergen Reactions  . Latex Shortness Of Breath, Swelling and Rash  . Other Itching and Nausea And Vomiting    Pain medication given after surgery; pt doesn't remember name.  Most likely opioid-derived.   No current outpatient prescriptions on file prior to visit.   No current facility-administered medications on file prior  to visit.    Review of Systems  Constitutional: Negative for unusual diaphoresis or night sweats HENT: Negative for ear swelling or discharge Eyes: Negative for worsening visual haziness  Respiratory: Negative for choking and stridor.   Gastrointestinal: Negative for distension or worsening eructation Genitourinary: Negative for retention or change in urine volume.  Musculoskeletal: Negative for other MSK pain or swelling Skin: Negative for color change and worsening  wound Neurological: Negative for tremors and numbness other than noted  Psychiatric/Behavioral: Negative for decreased concentration or agitation other than above       Objective:   Physical Exam BP 118/78 mmHg  Pulse 91  Temp(Src) 98.8 F (37.1 C) (Oral)  Resp 20  Wt 174 lb (78.926 kg)  SpO2 96% VS noted, non toxic but uncomfortable Constitutional: Pt appears in no apparent distress HENT: Head: NCAT.  Right Ear: External ear normal.  Left Ear: External ear normal.  Eyes: . Pupils are equal, round, and reactive to light. Conjunctivae and EOM are normal Neck: Normal range of motion. Neck supple. No mass or swelling Cardiovascular: Normal rate and regular rhythm.   Pulmonary/Chest: Effort normal and breath sounds without rales or wheezing.  Right shoulder FROM, but diffuse swelling throughout 2+ with some lesser swelling/puffiness slight noted extending whole arm to fingers, worst tender over right deltoid diffusely, none to bicep/tricep, no rash except right medial mid arm with 2 cm area what appears to be bruising, tan/red color, tender without fluctuance or drainage or red streaks, unable to appreciate any sting site, no obvious cords palpated Neurological: Pt is alert. Not confused , motor grossly intact, sens/dtr intact Skin: Skin is warm. No rash, no LE edema Psychiatric: Pt behavior is normal. No agitation. not depressed affect    Assessment & Plan:

## 2015-11-27 NOTE — Patient Instructions (Signed)
You had the steroid shot today  Please take all new medication as prescribed - the antibiotic, pain medication and prednisone  Please go to ER for any Chest pain or worsening shortness of breath  Please continue all other medications as before, and refills have been done if requested.  Please have the pharmacy call with any other refills you may need.  Please keep your appointments with your specialists as you may have planned

## 2015-11-27 NOTE — Progress Notes (Signed)
Pre visit review using our clinic review tool, if applicable. No additional management support is needed unless otherwise documented below in the visit note. 

## 2015-11-29 NOTE — Assessment & Plan Note (Signed)
Mild, seasonal flare, liekly to improve as well with steroid tx,  to f/u any worsening symptoms or concerns

## 2015-11-29 NOTE — Assessment & Plan Note (Signed)
stable overall by history and exam, recent data reviewed with pt, and pt to continue medical treatment as before,  to f/u any worsening symptoms or concerns Lab Results  Component Value Date   WBC 5.7 06/11/2015   HGB 13.3 06/11/2015   HCT 39.9 06/11/2015   PLT 242.0 06/11/2015   GLUCOSE 97 06/11/2015   CHOL 192 06/11/2015   TRIG 109.0 06/11/2015   HDL 39.60 06/11/2015   LDLCALC 131* 06/11/2015   ALT 18 06/11/2015   AST 18 06/11/2015   NA 141 06/11/2015   K 3.9 06/11/2015   CL 104 06/11/2015   CREATININE 0.56 06/11/2015   BUN 14 06/11/2015   CO2 30 06/11/2015   TSH 0.51 06/11/2015

## 2016-05-20 ENCOUNTER — Other Ambulatory Visit (INDEPENDENT_AMBULATORY_CARE_PROVIDER_SITE_OTHER): Payer: BLUE CROSS/BLUE SHIELD

## 2016-05-20 DIAGNOSIS — Z0000 Encounter for general adult medical examination without abnormal findings: Secondary | ICD-10-CM

## 2016-05-20 LAB — LIPID PANEL
CHOLESTEROL: 187 mg/dL (ref 0–200)
HDL: 45.1 mg/dL (ref >39.00)
LDL Cholesterol: 129 mg/dL — ABNORMAL HIGH (ref 0–99)
NONHDL: 141.66
Total CHOL/HDL Ratio: 4
Triglycerides: 63 mg/dL (ref 0.0–149.0)
VLDL: 12.6 mg/dL (ref 0.0–40.0)

## 2016-05-20 LAB — CBC WITH DIFFERENTIAL/PLATELET
BASOS ABS: 0 10*3/uL (ref 0.0–0.1)
Basophils Relative: 0.4 % (ref 0.0–3.0)
Eosinophils Absolute: 0.1 10*3/uL (ref 0.0–0.7)
Eosinophils Relative: 0.9 % (ref 0.0–5.0)
HCT: 35.8 % — ABNORMAL LOW (ref 36.0–46.0)
Hemoglobin: 12.1 g/dL (ref 12.0–15.0)
LYMPHS ABS: 2.5 10*3/uL (ref 0.7–4.0)
Lymphocytes Relative: 39 % (ref 12.0–46.0)
MCHC: 33.8 g/dL (ref 30.0–36.0)
MCV: 85.6 fl (ref 78.0–100.0)
Monocytes Absolute: 0.3 10*3/uL (ref 0.1–1.0)
Monocytes Relative: 4.8 % (ref 3.0–12.0)
NEUTROS ABS: 3.5 10*3/uL (ref 1.4–7.7)
NEUTROS PCT: 54.9 % (ref 43.0–77.0)
PLATELETS: 234 10*3/uL (ref 150.0–400.0)
RBC: 4.19 Mil/uL (ref 3.87–5.11)
RDW: 13.9 % (ref 11.5–15.5)
WBC: 6.4 10*3/uL (ref 4.0–10.5)

## 2016-05-20 LAB — BASIC METABOLIC PANEL
BUN: 16 mg/dL (ref 6–23)
CHLORIDE: 104 meq/L (ref 96–112)
CO2: 30 mEq/L (ref 19–32)
Calcium: 9.3 mg/dL (ref 8.4–10.5)
Creatinine, Ser: 0.63 mg/dL (ref 0.40–1.20)
GFR: 107.46 mL/min (ref >60.00)
GLUCOSE: 97 mg/dL (ref 70–99)
Potassium: 3.8 mEq/L (ref 3.5–5.1)
Sodium: 141 mEq/L (ref 135–145)

## 2016-05-20 LAB — URINALYSIS, ROUTINE W REFLEX MICROSCOPIC
BILIRUBIN URINE: NEGATIVE
KETONES UR: NEGATIVE
Nitrite: NEGATIVE
PH: 6 (ref 5.0–8.0)
SPECIFIC GRAVITY, URINE: 1.015 (ref 1.000–1.030)
Total Protein, Urine: NEGATIVE
URINE GLUCOSE: NEGATIVE
Urobilinogen, UA: 0.2 (ref 0.0–1.0)

## 2016-05-20 LAB — HEPATIC FUNCTION PANEL
ALBUMIN: 4.1 g/dL (ref 3.5–5.2)
ALK PHOS: 69 U/L (ref 39–117)
ALT: 28 U/L (ref 0–35)
AST: 27 U/L (ref 0–37)
BILIRUBIN DIRECT: 0 mg/dL (ref 0.0–0.3)
Total Bilirubin: 0.4 mg/dL (ref 0.2–1.2)
Total Protein: 7.5 g/dL (ref 6.0–8.3)

## 2016-05-20 LAB — TSH: TSH: 1.3 u[IU]/mL (ref 0.35–4.50)

## 2016-06-12 ENCOUNTER — Ambulatory Visit (INDEPENDENT_AMBULATORY_CARE_PROVIDER_SITE_OTHER): Payer: BLUE CROSS/BLUE SHIELD | Admitting: Internal Medicine

## 2016-06-12 ENCOUNTER — Encounter: Payer: Self-pay | Admitting: Internal Medicine

## 2016-06-12 VITALS — BP 122/80 | HR 61 | Temp 98.2°F | Resp 20 | Wt 172.0 lb

## 2016-06-12 DIAGNOSIS — E785 Hyperlipidemia, unspecified: Secondary | ICD-10-CM

## 2016-06-12 DIAGNOSIS — Z23 Encounter for immunization: Secondary | ICD-10-CM | POA: Diagnosis not present

## 2016-06-12 DIAGNOSIS — Z0000 Encounter for general adult medical examination without abnormal findings: Secondary | ICD-10-CM

## 2016-06-12 NOTE — Patient Instructions (Addendum)

## 2016-06-12 NOTE — Assessment & Plan Note (Signed)

## 2016-06-12 NOTE — Progress Notes (Signed)
Pre visit review using our clinic review tool, if applicable. No additional management support is needed unless otherwise documented below in the visit note. 

## 2016-06-12 NOTE — Progress Notes (Signed)
Subjective:    Patient ID: [name], female    DOB: 11/15/1970, 45 y.o.   MRN: [id number]  HPI  Here for wellness and f/u;  Overall doing ok;  Pt denies Chest pain, worsening SOB, DOE, wheezing, orthopnea, PND, worsening LE edema, palpitations, dizziness or syncope.  Pt denies neurological change such as new headache, facial or extremity weakness.  Pt denies polydipsia, polyuria, or low sugar symptoms. Pt states overall good compliance with treatment and medications, good tolerability, and has been trying to follow appropriate diet.  Pt denies worsening depressive symptoms, suicidal ideation or panic. No fever, night sweats, wt loss, loss of appetite, or other constitutional symptoms.  Pt states good ability with ADL's, has low fall risk, home safety reviewed and adequate, no other significant changes in hearing or vision, and only occasionally active with exercise. Pt states no change in history Wt Readings from Last 3 Encounters:  06/12/16 172 lb (78 kg)  11/27/15 174 lb (78.9 kg)  06/12/15 172 lb 6.4 oz (78.2 kg)   Past Medical History:  Diagnosis Date  . Abdominal pain, left lower quadrant 03/26/2008  . ABDOMINAL PAIN, SUPRAPUBIC 08/04/2007  . ALLERGIC RHINITIS 08/04/2007  . Anemia   . ANXIETY 08/04/2007  . Cough    instructed to see primary care if worsens-no fever  . DEPRESSION 08/04/2007  . FATIGUE 08/04/2007  . GERD 08/04/2007  . Headache(784.0)   . Hyperlipidemia 05/20/2014  . Insomnia   . SINUSITIS- ACUTE-NOS 07/25/2010  . SVD (spontaneous vaginal delivery)    x 4   Past Surgical History:  Procedure Laterality Date  . ABDOMINAL HYSTERECTOMY  2004  . LAPAROSCOPY  03/23/2012   Procedure: LAPAROSCOPY OPERATIVE;  Surgeon: Margarette Asal, MD;  Location: [facility];  Service: Gynecology;  Laterality: N/A;  with CO2 laser ablation of Vin;need microscope,vaginal vault excision of keloid;will want kenolog 40 from pharmacy.  Marland Kitchen LAPAROTOMY  05/02/2012   Procedure: EXPLORATORY LAPAROTOMY;   Surgeon: Margarette Asal, MD;  Location: [facility];  Service: Gynecology;  Laterality: N/A;  . RHINOPLASTY     nose  . SALPINGOOPHORECTOMY  05/02/2012   Procedure: SALPINGO OOPHERECTOMY;  Surgeon: Margarette Asal, MD;  Location: [facility];  Service: Gynecology;  Laterality: Bilateral;  bilateral  . SCAR REVISION  03/23/2012   Procedure: SCAR REVISION;  Surgeon: Margarette Asal, MD;  Location: [facility];  Service: Gynecology;  Laterality: N/A;  . SCAR REVISION N/A 08/08/2013   Procedure: SCAR REVISION ABDOMINAL;  Surgeon: Margarette Asal, MD;  Location: [facility];  Service: Gynecology;  Laterality: N/A;  . TUBAL LIGATION    . WISDOM TOOTH EXTRACTION      reports that she has never smoked. She has never used smokeless tobacco. She reports that she does not drink alcohol or use drugs. family history includes Asthma in her mother. Allergies  Allergen Reactions  . Latex Shortness Of Breath, Swelling and Rash  . Other Itching and Nausea And Vomiting    Pain medication given after surgery; pt doesn't remember name.  Most likely opioid-derived.   No current outpatient prescriptions on file prior to visit.   No current facility-administered medications on file prior to visit.    Review of Systems Constitutional: Negative for increased diaphoresis, or other activity, appetite or siginficant weight change other than noted HENT: Negative for worsening hearing loss, ear pain, facial swelling, mouth sores and neck stiffness.   Eyes: Negative for other worsening pain, redness or visual disturbance.  Respiratory: Negative  for choking or stridor Cardiovascular: Negative for other chest pain and palpitations.  Gastrointestinal: Negative for worsening diarrhea, blood in stool, or abdominal distention Genitourinary: Negative for hematuria, flank pain or change in urine volume.  Musculoskeletal: Negative for myalgias or other joint complaints.  Skin: Negative for other color change and wound or drainage.    Neurological: Negative for syncope and numbness. other than noted Hematological: Negative for adenopathy. or other swelling Psychiatric/Behavioral: Negative for hallucinations, SI, self-injury, decreased concentration or other worsening agitation.  Pt states all other systems neg    Objective:   Physical Exam BP 122/80   Pulse 61   Temp 98.2 F (36.8 C) (Oral)   Resp 20   Wt 172 lb (78 kg)   SpO2 98%   BMI 26.94 kg/m  VS noted,  Constitutional: Pt is oriented to person, place, and time. Appears well-developed and well-nourished, in no significant distress Head: Normocephalic and atraumatic  Eyes: Conjunctivae and EOM are normal. Pupils are equal, round, and reactive to light Right Ear: External ear normal.  Left Ear: External ear normal Nose: Nose normal.  Mouth/Throat: Oropharynx is clear and moist  Neck: Normal range of motion. Neck supple. No JVD present. No tracheal deviation present or significant neck LA or mass Cardiovascular: Normal rate, regular rhythm, normal heart sounds and intact distal pulses.   Pulmonary/Chest: Effort normal and breath sounds without rales or wheezing  Abdominal: Soft. Bowel sounds are normal. NT. No HSM  Musculoskeletal: Normal range of motion. Exhibits no edema Lymphadenopathy: Has no cervical adenopathy.  Neurological: Pt is alert and oriented to person, place, and time. Pt has normal reflexes. No cranial nerve deficit. Motor grossly intact Skin: Skin is warm and dry. No rash noted or new ulcers Psychiatric:  Has normal mood and affect. Behavior is normal.  No other signficant exam abnormal    Assessment & Plan:

## 2016-06-12 NOTE — Assessment & Plan Note (Signed)
Mild, to cont work on lower chol diet

## 2016-06-26 ENCOUNTER — Ambulatory Visit (INDEPENDENT_AMBULATORY_CARE_PROVIDER_SITE_OTHER): Payer: BLUE CROSS/BLUE SHIELD | Admitting: Internal Medicine

## 2016-06-26 ENCOUNTER — Encounter: Payer: Self-pay | Admitting: Internal Medicine

## 2016-06-26 VITALS — BP 130/68 | HR 77 | Resp 20 | Wt 168.0 lb

## 2016-06-26 DIAGNOSIS — R059 Cough, unspecified: Secondary | ICD-10-CM

## 2016-06-26 DIAGNOSIS — R05 Cough: Secondary | ICD-10-CM

## 2016-06-26 DIAGNOSIS — J309 Allergic rhinitis, unspecified: Secondary | ICD-10-CM

## 2016-06-26 MED ORDER — PREDNISONE 10 MG PO TABS: ORAL_TABLET | ORAL | 0 refills | Status: DC

## 2016-06-26 MED ORDER — HYDROCOD POLST-CPM POLST ER 10-8 MG/5ML PO SUER: 5.0000 mL | Freq: Two times a day (BID) | ORAL | 0 refills | Status: DC | PRN

## 2016-06-26 MED ORDER — AZITHROMYCIN 250 MG PO TABS: ORAL_TABLET | ORAL | 1 refills | Status: DC

## 2016-06-26 NOTE — Patient Instructions (Signed)
You had the steroid shot today  Please take all new medication as prescribed - the antibiotic, cough medicine, and prednisone  Please continue all other medications as before, and refills have been done if requested.  Please have the pharmacy call with any other refills you may need.  Please keep your appointments with your specialists as you may have planned   

## 2016-06-26 NOTE — Progress Notes (Signed)
Pre visit review using our clinic review tool, if applicable. No additional management support is needed unless otherwise documented below in the visit note. 

## 2016-06-27 NOTE — Assessment & Plan Note (Signed)
C/w bronchitis vs pna, declines cxr, Mild to mod, for antibx course,  to f/u any worsening symptoms or concerns 

## 2016-06-27 NOTE — Assessment & Plan Note (Signed)
Mild to mod, for depomedrol IM 80, predpac asd, to f/u any worsening symptoms or concerns 

## 2016-06-27 NOTE — Progress Notes (Signed)
Subjective:    Patient ID: [name], female    DOB: 1971-08-02, 45 y.o.   MRN: [id number]  HPI   Here with 2-3 days acute onset fever, facial pain, pressure, headache, general weakness and malaise, and greenish d/c, with mild ST and cough, but pt denies chest pain, wheezing, increased sob or doe, orthopnea, PND, increased LE swelling, palpitations, dizziness or syncope.  Also, Does have several wks ongoing nasal allergy symptoms with clearish congestion, itch and sneezing. Pt denies new neurological symptoms such as new headache, or facial or extremity weakness or numbness   Pt denies polydipsia, polyuria Past Medical History:  Diagnosis Date  . Abdominal pain, left lower quadrant 03/26/2008  . ABDOMINAL PAIN, SUPRAPUBIC 08/04/2007  . ALLERGIC RHINITIS 08/04/2007  . Anemia   . ANXIETY 08/04/2007  . Cough    instructed to see primary care if worsens-no fever  . DEPRESSION 08/04/2007  . FATIGUE 08/04/2007  . GERD 08/04/2007  . Headache(784.0)   . Hyperlipidemia 05/20/2014  . Insomnia   . SINUSITIS- ACUTE-NOS 07/25/2010  . SVD (spontaneous vaginal delivery)    x 4   Past Surgical History:  Procedure Laterality Date  . ABDOMINAL HYSTERECTOMY  2004  . LAPAROSCOPY  03/23/2012   Procedure: LAPAROSCOPY OPERATIVE;  Surgeon: Margarette Asal, MD;  Location: [facility];  Service: Gynecology;  Laterality: N/A;  with CO2 laser ablation of Vin;need microscope,vaginal vault excision of keloid;will want kenolog 40 from pharmacy.  Marland Kitchen LAPAROTOMY  05/02/2012   Procedure: EXPLORATORY LAPAROTOMY;  Surgeon: Margarette Asal, MD;  Location: [facility];  Service: Gynecology;  Laterality: N/A;  . RHINOPLASTY     nose  . SALPINGOOPHORECTOMY  05/02/2012   Procedure: SALPINGO OOPHERECTOMY;  Surgeon: Margarette Asal, MD;  Location: [facility];  Service: Gynecology;  Laterality: Bilateral;  bilateral  . SCAR REVISION  03/23/2012   Procedure: SCAR REVISION;  Surgeon: Margarette Asal, MD;  Location: [facility];  Service:  Gynecology;  Laterality: N/A;  . SCAR REVISION N/A 08/08/2013   Procedure: SCAR REVISION ABDOMINAL;  Surgeon: Margarette Asal, MD;  Location: [facility];  Service: Gynecology;  Laterality: N/A;  . TUBAL LIGATION    . WISDOM TOOTH EXTRACTION      reports that she has never smoked. She has never used smokeless tobacco. She reports that she does not drink alcohol or use drugs. family history includes Asthma in her mother. Allergies  Allergen Reactions  . Latex Shortness Of Breath, Swelling and Rash  . Other Itching and Nausea And Vomiting    Pain medication given after surgery; pt doesn't remember name.  Most likely opioid-derived.   No current outpatient prescriptions on file prior to visit.   No current facility-administered medications on file prior to visit.    Review of Systems  Constitutional: Negative for unusual diaphoresis or night sweats HENT: Negative for ear swelling or discharge Eyes: Negative for worsening visual haziness  Respiratory: Negative for choking and stridor.   Gastrointestinal: Negative for distension or worsening eructation Genitourinary: Negative for retention or change in urine volume.  Musculoskeletal: Negative for other MSK pain or swelling Skin: Negative for color change and worsening wound Neurological: Negative for tremors and numbness other than noted  Psychiatric/Behavioral: Negative for decreased concentration or agitation other than above   All other system neg per pt    Objective:   Physical Exam BP 130/68   Pulse 77   Resp 20   Wt 168 lb (76.2 kg)   SpO2  97%   BMI 26.31 kg/m  VS noted, mild ill Constitutional: Pt appears in no apparent distress HENT: Head: NCAT.  Right Ear: External ear normal.  Left Ear: External ear normal.  Bilat tm's with mild erythema.  Max sinus areas mild tender.  Pharynx with mild erythema, no exudate Eyes: . Pupils are equal, round, and reactive to light. Conjunctivae and EOM are normal Neck: Normal range of  motion. Neck supple.  Cardiovascular: Normal rate and regular rhythm.   Pulmonary/Chest: Effort normal and breath sounds without rales or wheezing.  Neurological: Pt is alert. Not confused , motor grossly intact Skin: Skin is warm. No rash, no LE edema Psychiatric: Pt behavior is normal. No agitation.  No other new exam findings    Assessment & Plan:

## 2017-09-02 ENCOUNTER — Other Ambulatory Visit (INDEPENDENT_AMBULATORY_CARE_PROVIDER_SITE_OTHER): Payer: Managed Care, Other (non HMO)

## 2017-09-02 ENCOUNTER — Telehealth: Payer: Self-pay

## 2017-09-02 DIAGNOSIS — Z0000 Encounter for general adult medical examination without abnormal findings: Secondary | ICD-10-CM

## 2017-09-02 LAB — CBC WITH DIFFERENTIAL/PLATELET
BASOS PCT: 0.6 % (ref 0.0–3.0)
Basophils Absolute: 0 10*3/uL (ref 0.0–0.1)
EOS ABS: 0.1 10*3/uL (ref 0.0–0.7)
Eosinophils Relative: 1.6 % (ref 0.0–5.0)
HEMATOCRIT: 41.4 % (ref 36.0–46.0)
Hemoglobin: 13.5 g/dL (ref 12.0–15.0)
LYMPHS PCT: 40.4 % (ref 12.0–46.0)
Lymphs Abs: 2 10*3/uL (ref 0.7–4.0)
MCHC: 32.6 g/dL (ref 30.0–36.0)
MCV: 90.7 fl (ref 78.0–100.0)
MONO ABS: 0.2 10*3/uL (ref 0.1–1.0)
Monocytes Relative: 4.9 % (ref 3.0–12.0)
Neutro Abs: 2.6 10*3/uL (ref 1.4–7.7)
Neutrophils Relative %: 52.5 % (ref 43.0–77.0)
Platelets: 253 10*3/uL (ref 150.0–400.0)
RBC: 4.57 Mil/uL (ref 3.87–5.11)
RDW: 14 % (ref 11.5–15.5)
WBC: 4.9 10*3/uL (ref 4.0–10.5)

## 2017-09-02 LAB — URINALYSIS, ROUTINE W REFLEX MICROSCOPIC
Bilirubin Urine: NEGATIVE
HGB URINE DIPSTICK: NEGATIVE
Ketones, ur: NEGATIVE
Nitrite: NEGATIVE
PH: 6.5 (ref 5.0–8.0)
RBC / HPF: NONE SEEN (ref 0–?)
Specific Gravity, Urine: <=1.005 — AB (ref 1.000–1.030)
TOTAL PROTEIN, URINE-UPE24: NEGATIVE
URINE GLUCOSE: NEGATIVE
Urobilinogen, UA: 0.2 (ref 0.0–1.0)

## 2017-09-02 LAB — BASIC METABOLIC PANEL
BUN: 14 mg/dL (ref 6–23)
CO2: 30 mEq/L (ref 19–32)
Calcium: 9.7 mg/dL (ref 8.4–10.5)
Chloride: 102 mEq/L (ref 96–112)
Creatinine, Ser: 0.56 mg/dL (ref 0.40–1.20)
GFR: 122.44 mL/min (ref >60.00)
GLUCOSE: 101 mg/dL — AB (ref 70–99)
POTASSIUM: 4 meq/L (ref 3.5–5.1)
SODIUM: 140 meq/L (ref 135–145)

## 2017-09-02 LAB — HEPATIC FUNCTION PANEL
ALBUMIN: 4.5 g/dL (ref 3.5–5.2)
ALT: 16 U/L (ref 0–35)
AST: 16 U/L (ref 0–37)
Alkaline Phosphatase: 76 U/L (ref 39–117)
Bilirubin, Direct: 0.1 mg/dL (ref 0.0–0.3)
Total Bilirubin: 0.4 mg/dL (ref 0.2–1.2)
Total Protein: 7.7 g/dL (ref 6.0–8.3)

## 2017-09-02 LAB — LIPID PANEL
CHOLESTEROL: 226 mg/dL — AB (ref 0–200)
HDL: 44.1 mg/dL (ref >39.00)
LDL CALC: 167 mg/dL — AB (ref 0–99)
NonHDL: 181.5
Total CHOL/HDL Ratio: 5
Triglycerides: 73 mg/dL (ref 0.0–149.0)
VLDL: 14.6 mg/dL (ref 0.0–40.0)

## 2017-09-02 LAB — TSH: TSH: 0.85 u[IU]/mL (ref 0.35–4.50)

## 2017-09-02 NOTE — Telephone Encounter (Signed)
Pt needed labs entered for upcoming CPE

## 2017-09-03 ENCOUNTER — Ambulatory Visit (INDEPENDENT_AMBULATORY_CARE_PROVIDER_SITE_OTHER): Payer: Managed Care, Other (non HMO) | Admitting: Internal Medicine

## 2017-09-03 ENCOUNTER — Encounter: Payer: Self-pay | Admitting: Internal Medicine

## 2017-09-03 VITALS — BP 114/78 | HR 88 | Temp 98.0°F | Ht 67.0 in | Wt 178.0 lb

## 2017-09-03 DIAGNOSIS — K921 Melena: Secondary | ICD-10-CM | POA: Diagnosis not present

## 2017-09-03 DIAGNOSIS — R1032 Left lower quadrant pain: Secondary | ICD-10-CM

## 2017-09-03 DIAGNOSIS — F32A Depression, unspecified: Secondary | ICD-10-CM

## 2017-09-03 DIAGNOSIS — F329 Major depressive disorder, single episode, unspecified: Secondary | ICD-10-CM | POA: Diagnosis not present

## 2017-09-03 DIAGNOSIS — K120 Recurrent oral aphthae: Secondary | ICD-10-CM | POA: Diagnosis not present

## 2017-09-03 DIAGNOSIS — Z0001 Encounter for general adult medical examination with abnormal findings: Secondary | ICD-10-CM

## 2017-09-03 DIAGNOSIS — Z114 Encounter for screening for human immunodeficiency virus [HIV]: Secondary | ICD-10-CM

## 2017-09-03 DIAGNOSIS — N951 Menopausal and female climacteric states: Secondary | ICD-10-CM

## 2017-09-03 MED ORDER — TRIAMCINOLONE ACETONIDE 0.1 % MT PSTE: 1.0000 "application " | PASTE | Freq: Two times a day (BID) | OROMUCOSAL | 12 refills | Status: DC

## 2017-09-03 MED ORDER — ESCITALOPRAM OXALATE 10 MG PO TABS: 10.0000 mg | ORAL_TABLET | Freq: Every day | ORAL | 3 refills | Status: DC

## 2017-09-03 NOTE — Progress Notes (Signed)
Subjective:    Patient ID: [name], female    DOB: 1971-02-05, 47 y.o.   MRN: [id number]  HPI  Here for wellness and f/u;  Overall doing ok;  Pt denies Chest pain, worsening SOB, DOE, wheezing, orthopnea, PND, worsening LE edema, palpitations, dizziness or syncope.  Pt denies neurological change such as new headache, facial or extremity weakness.  Pt denies polydipsia, polyuria, or low sugar symptoms. Pt states overall good compliance with treatment and medications, good tolerability, and has been trying to follow appropriate diet.  No fever, night sweats, wt loss, loss of appetite, or other constitutional symptoms.  Pt states good ability with ADL's, has low fall risk, home safety reviewed and adequate, no other significant changes in hearing or vision, and only occasionally active with exercise.  Declines flu shot.   Also incidentally c/o Denies mild worsening depressive symptoms, but no suicidal ideation, or panic; has ongoing anxiety as well.  Also has worsening menopausal symptoms with hot flashes and mood swings.  No longer with menses after TSH.  Also c/o 1 wk onset mouth ulcers with pain, slight swelling, with hx of apthous ulcers in past.  Also with chronic LLQ pain but Denies worsening reflux, dysphagia, n/v, or bowel change, but has had intermittent small volume BRBPR for the past 6 mo for no apparent reason.  Has seen GYN and not felt to be GYN related.  Has not had prior colonoscopy  Past Medical History:  Diagnosis Date  . Abdominal pain, left lower quadrant 03/26/2008  . ABDOMINAL PAIN, SUPRAPUBIC 08/04/2007  . ALLERGIC RHINITIS 08/04/2007  . Anemia   . ANXIETY 08/04/2007  . Cough    instructed to see primary care if worsens-no fever  . DEPRESSION 08/04/2007  . FATIGUE 08/04/2007  . GERD 08/04/2007  . Headache(784.0)   . Hyperlipidemia 05/20/2014  . Insomnia   . SINUSITIS- ACUTE-NOS 07/25/2010  . SVD (spontaneous vaginal delivery)    x 4   Past Surgical History:  Procedure  Laterality Date  . ABDOMINAL HYSTERECTOMY  2004  . LAPAROSCOPY  03/23/2012   Procedure: LAPAROSCOPY OPERATIVE;  Surgeon: Margarette Asal, MD;  Location: [facility];  Service: Gynecology;  Laterality: N/A;  with CO2 laser ablation of Vin;need microscope,vaginal vault excision of keloid;will want kenolog 40 from pharmacy.  Marland Kitchen LAPAROTOMY  05/02/2012   Procedure: EXPLORATORY LAPAROTOMY;  Surgeon: Margarette Asal, MD;  Location: [facility];  Service: Gynecology;  Laterality: N/A;  . RHINOPLASTY     nose  . SALPINGOOPHORECTOMY  05/02/2012   Procedure: SALPINGO OOPHERECTOMY;  Surgeon: Margarette Asal, MD;  Location: [facility];  Service: Gynecology;  Laterality: Bilateral;  bilateral  . SCAR REVISION  03/23/2012   Procedure: SCAR REVISION;  Surgeon: Margarette Asal, MD;  Location: [facility];  Service: Gynecology;  Laterality: N/A;  . SCAR REVISION N/A 08/08/2013   Procedure: SCAR REVISION ABDOMINAL;  Surgeon: Margarette Asal, MD;  Location: [facility];  Service: Gynecology;  Laterality: N/A;  . TUBAL LIGATION    . WISDOM TOOTH EXTRACTION      reports that  has never smoked. she has never used smokeless tobacco. She reports that she does not drink alcohol or use drugs. family history includes Asthma in her mother. Allergies  Allergen Reactions  . Latex Shortness Of Breath, Swelling and Rash  . Other Itching and Nausea And Vomiting    Pain medication given after surgery; pt doesn't remember name.  Most likely opioid-derived.   Review of Systems  Constitutional: Negative for other unusual diaphoresis, sweats, appetite or weight changes HENT: Negative for other worsening hearing loss, ear pain, facial swelling, mouth sores or neck stiffness.   Eyes: Negative for other worsening pain, redness or other visual disturbance.  Respiratory: Negative for other stridor or swelling Cardiovascular: Negative for other palpitations or other chest pain  Gastrointestinal: Negative for worsening diarrhea or loose stools, blood  in stool, distention or other pain Genitourinary: Negative for hematuria, flank pain or other change in urine volume.  Musculoskeletal: Negative for myalgias or other joint swelling.  Skin: Negative for other color change, or other wound or worsening drainage.  Neurological: Negative for other syncope or numbness. Hematological: Negative for other adenopathy or swelling Psychiatric/Behavioral: Negative for hallucinations, other worsening agitation, SI, self-injury, or new decreased concentration All other system neg per pt    Objective:   Physical Exam BP 114/78   Pulse 88   Temp 98 F (36.7 C) (Oral)   Ht 5\' 7"  (1.702 m)   Wt 178 lb (80.7 kg)   SpO2 100%   BMI 27.88 kg/m  VS noted,  Constitutional: Pt is oriented to person, place, and time. Appears well-developed and well-nourished, in no significant distress and comfortable Head: Normocephalic and atraumatic  Eyes: Conjunctivae and EOM are normal. Pupils are equal, round, and reactive to light Right Ear: External ear normal without discharge Left Ear: External ear normal without discharge Nose: Nose without discharge or deformity Mouth/Throat: Oropharynx is without other ulcerations and moist except for left sided inner cheek apthous type appearing ulcer < 5 mm Neck: Normal range of motion. Neck supple. No JVD present. No tracheal deviation present or significant neck LA or mass Cardiovascular: Normal rate, regular rhythm, normal heart sounds and intact distal pulses.   Pulmonary/Chest: WOB normal and breath sounds without rales or wheezing  Abdominal: Soft. Bowel sounds are normal. No HSM with mild LLQ tender without mass , guarding or rebound  Musculoskeletal: Normal range of motion. Exhibits no edema Lymphadenopathy: Has no other cervical adenopathy.  Neurological: Pt is alert and oriented to person, place, and time. Pt has normal reflexes. No cranial nerve deficit. Motor grossly intact, Gait intact Skin: Skin is warm and dry.  No rash noted or new ulcerations Psychiatric:  Has depressed mood and affect. Behavior is normal without agitation No other exam findings    Assessment & Plan:

## 2017-09-03 NOTE — Patient Instructions (Addendum)
Please take all new medication as prescribed - the lexapro, and the mouth medication  Please continue all other medications as before, and refills have been done if requested.  Please have the pharmacy call with any other refills you may need.  Please continue your efforts at being more active, low cholesterol diet, and weight control.  You are otherwise up to date with prevention measures today.  Please keep your appointments with your specialists as you may have planned  You will be contacted regarding the referral for: Gastroenterology  Please return in 1 year for your yearly visit, or sooner if needed, with Lab testing done 3-5 days before

## 2017-09-04 DIAGNOSIS — R1032 Left lower quadrant pain: Secondary | ICD-10-CM | POA: Insufficient documentation

## 2017-09-04 DIAGNOSIS — N951 Menopausal and female climacteric states: Secondary | ICD-10-CM | POA: Insufficient documentation

## 2017-09-04 DIAGNOSIS — K921 Melena: Secondary | ICD-10-CM | POA: Insufficient documentation

## 2017-09-04 NOTE — Assessment & Plan Note (Signed)
Etiology unclear, refer to GI

## 2017-09-04 NOTE — Assessment & Plan Note (Signed)

## 2017-09-04 NOTE — Assessment & Plan Note (Addendum)
To restart lexapro 10 qd, declines counseling or psychiatric referral  In addition to the time spent performing CPE, I spent an additional 25 minutes face to face,in which greater than 50% of this time was spent in counseling and coordination of care for patient's acute illness as documented, including the differential dx, treatment, further evaluation and other management of depression, oral apthous ulcer, LLQ pain, hematochezia, and menopausal symptom management

## 2017-09-04 NOTE — Assessment & Plan Note (Signed)
D./w pt, for [facility] with labs

## 2017-09-04 NOTE — Assessment & Plan Note (Signed)
Mild small volume intermittent recent, etiology unclear , for GI referral, may need colonoscopy

## 2017-09-04 NOTE — Assessment & Plan Note (Signed)
Ok for kenalog in orabase,  to f/u any worsening symptoms or concerns

## 2017-10-27 ENCOUNTER — Ambulatory Visit (INDEPENDENT_AMBULATORY_CARE_PROVIDER_SITE_OTHER): Payer: Managed Care, Other (non HMO) | Admitting: Internal Medicine

## 2017-10-27 ENCOUNTER — Encounter: Payer: Self-pay | Admitting: Internal Medicine

## 2017-10-27 VITALS — BP 112/76 | HR 79 | Temp 98.6°F | Ht 67.0 in | Wt 174.0 lb

## 2017-10-27 DIAGNOSIS — J019 Acute sinusitis, unspecified: Secondary | ICD-10-CM | POA: Diagnosis not present

## 2017-10-27 DIAGNOSIS — F329 Major depressive disorder, single episode, unspecified: Secondary | ICD-10-CM

## 2017-10-27 DIAGNOSIS — M549 Dorsalgia, unspecified: Secondary | ICD-10-CM | POA: Diagnosis not present

## 2017-10-27 DIAGNOSIS — Z23 Encounter for immunization: Secondary | ICD-10-CM | POA: Diagnosis not present

## 2017-10-27 DIAGNOSIS — F32A Depression, unspecified: Secondary | ICD-10-CM

## 2017-10-27 MED ORDER — CYCLOBENZAPRINE HCL 5 MG PO TABS: 5.0000 mg | ORAL_TABLET | Freq: Three times a day (TID) | ORAL | 1 refills | Status: DC | PRN

## 2017-10-27 MED ORDER — HYDROCOD POLST-CPM POLST ER 10-8 MG/5ML PO SUER: 5.0000 mL | Freq: Two times a day (BID) | ORAL | 0 refills | Status: DC | PRN

## 2017-10-27 MED ORDER — LEVOFLOXACIN 250 MG PO TABS: 250.0000 mg | ORAL_TABLET | Freq: Every day | ORAL | 0 refills | Status: AC

## 2017-10-27 NOTE — Assessment & Plan Note (Signed)
stable overall by history and exam, and pt to continue medical treatment as before,  to f/u any worsening symptoms or concerns 

## 2017-10-27 NOTE — Assessment & Plan Note (Signed)
Mild to mod, for antibx course,  to f/u any worsening symptoms or concerns 

## 2017-10-27 NOTE — Assessment & Plan Note (Signed)
Acute worsening, mild, likely msk, declines cxr for now, for muscle relaxer prn

## 2017-10-27 NOTE — Progress Notes (Signed)
Subjective:    Patient ID: [name], female    DOB: 07-Jun-1971, 47 y.o.   MRN: [id number]  HPI   Here with 2-3 days acute onset fever, facial pain, pressure, headache, general weakness and malaise, and greenish d/c, with mild ST and but marked scant prod cough worse at night assoc with worsening left flank and lower back pain, pleuritic and worse to twist about, but pt denies othe rchest pain, wheezing, increased sob or doe, orthopnea, PND, increased LE swelling, palpitations, dizziness or syncope. Denies urinary symptoms such as dysuria, frequency, urgency, flank pain, hematuria. Denies worsening depressive symptoms, suicidal ideation, or panic   No other interval hx or new complaints Past Medical History:  Diagnosis Date  . Abdominal pain, left lower quadrant 03/26/2008  . ABDOMINAL PAIN, SUPRAPUBIC 08/04/2007  . ALLERGIC RHINITIS 08/04/2007  . Anemia   . ANXIETY 08/04/2007  . Cough    instructed to see primary care if worsens-no fever  . DEPRESSION 08/04/2007  . FATIGUE 08/04/2007  . GERD 08/04/2007  . Headache(784.0)   . Hyperlipidemia 05/20/2014  . Insomnia   . SINUSITIS- ACUTE-NOS 07/25/2010  . SVD (spontaneous vaginal delivery)    x 4   Past Surgical History:  Procedure Laterality Date  . ABDOMINAL HYSTERECTOMY  2004  . LAPAROSCOPY  03/23/2012   Procedure: LAPAROSCOPY OPERATIVE;  Surgeon: Margarette Asal, MD;  Location: [facility];  Service: Gynecology;  Laterality: N/A;  with CO2 laser ablation of Vin;need microscope,vaginal vault excision of keloid;will want kenolog 40 from pharmacy.  Marland Kitchen LAPAROTOMY  05/02/2012   Procedure: EXPLORATORY LAPAROTOMY;  Surgeon: Margarette Asal, MD;  Location: [facility];  Service: Gynecology;  Laterality: N/A;  . RHINOPLASTY     nose  . SALPINGOOPHORECTOMY  05/02/2012   Procedure: SALPINGO OOPHERECTOMY;  Surgeon: Margarette Asal, MD;  Location: [facility];  Service: Gynecology;  Laterality: Bilateral;  bilateral  . SCAR REVISION  03/23/2012   Procedure:  SCAR REVISION;  Surgeon: Margarette Asal, MD;  Location: [facility];  Service: Gynecology;  Laterality: N/A;  . SCAR REVISION N/A 08/08/2013   Procedure: SCAR REVISION ABDOMINAL;  Surgeon: Margarette Asal, MD;  Location: [facility];  Service: Gynecology;  Laterality: N/A;  . TUBAL LIGATION    . WISDOM TOOTH EXTRACTION      reports that  has never smoked. she has never used smokeless tobacco. She reports that she does not drink alcohol or use drugs. family history includes Asthma in her mother. Allergies  Allergen Reactions  . Latex Shortness Of Breath, Swelling and Rash  . Other Itching and Nausea And Vomiting    Pain medication given after surgery; pt doesn't remember name.  Most likely opioid-derived.   Current Outpatient Medications on File Prior to Visit  Medication Sig Dispense Refill  . escitalopram (LEXAPRO) 10 MG tablet Take 1 tablet (10 mg total) by mouth daily. 90 tablet 3  . triamcinolone (KENALOG) 0.1 % paste Use as directed 1 application in the mouth or throat 2 (two) times daily. 5 g 12   No current facility-administered medications on file prior to visit.    Review of Systems  Constitutional: Negative for other unusual diaphoresis or sweats HENT: Negative for ear discharge or swelling Eyes: Negative for other worsening visual disturbances Respiratory: Negative for stridor or other swelling  Gastrointestinal: Negative for worsening distension or other blood Genitourinary: Negative for retention or other urinary change Musculoskeletal: Negative for other MSK pain or swelling Skin: Negative for color change  or other new lesions Neurological: Negative for worsening tremors and other numbness  Psychiatric/Behavioral: Negative for worsening agitation or other fatigue All other system neg per pt    Objective:   Physical Exam BP 112/76   Pulse 79   Temp 98.6 F (37 C) (Oral)   Ht 5\' 7"  (1.702 m)   Wt 174 lb (78.9 kg)   SpO2 96%   BMI 27.25 kg/m  VS noted, mild  ill Constitutional: Pt appears in NAD HENT: Head: NCAT.  Right Ear: External ear normal.  Left Ear: External ear normal.  Bilat tm's with mild erythema.  Max sinus areas mild tender.  Pharynx with mild erythema, no exudate Eyes: . Pupils are equal, round, and reactive to light. Conjunctivae and EOM are normal Nose: without d/c or deformity Neck: Neck supple. Gross normal ROM Cardiovascular: Normal rate and regular rhythm.   Pulmonary/Chest: Effort normal and breath sounds without rales or wheezing.  Abd:  Soft, NT, ND, + BS, no organomegaly + tender left flank and lower back Neurological: Pt is alert. At baseline orientation, motor grossly intact Skin: Skin is warm. No rashes, other new lesions, no LE edema Psychiatric: Pt behavior is normal without agitation , mild nervous, not depressed affect No other exam findings       Assessment & Plan:

## 2017-10-27 NOTE — Patient Instructions (Addendum)
You had the flu shot today  Please take all new medication as prescribed - the antibiotic, and cough medicine if needed, as well as the muscle relaxer as needed  You can also take Delsym OTC for cough, and/or Mucinex (or it's generic off brand) for congestion, and tylenol as needed for pain.  Please continue all other medications as before, and refills have been done if requested.  Please have the pharmacy call with any other refills you may need.  Please keep your appointments with your specialists as you may have planned

## 2017-10-29 ENCOUNTER — Encounter: Payer: Self-pay | Admitting: Internal Medicine

## 2018-09-06 ENCOUNTER — Telehealth: Payer: Self-pay

## 2018-09-06 ENCOUNTER — Encounter: Payer: Self-pay | Admitting: Internal Medicine

## 2018-09-06 ENCOUNTER — Other Ambulatory Visit (INDEPENDENT_AMBULATORY_CARE_PROVIDER_SITE_OTHER): Payer: Managed Care, Other (non HMO)

## 2018-09-06 ENCOUNTER — Ambulatory Visit (INDEPENDENT_AMBULATORY_CARE_PROVIDER_SITE_OTHER): Payer: Managed Care, Other (non HMO) | Admitting: Internal Medicine

## 2018-09-06 ENCOUNTER — Other Ambulatory Visit: Payer: Self-pay | Admitting: Internal Medicine

## 2018-09-06 VITALS — BP 132/84 | HR 93 | Temp 97.9°F | Ht 67.0 in | Wt 168.0 lb

## 2018-09-06 DIAGNOSIS — Z0000 Encounter for general adult medical examination without abnormal findings: Secondary | ICD-10-CM | POA: Diagnosis not present

## 2018-09-06 DIAGNOSIS — E785 Hyperlipidemia, unspecified: Secondary | ICD-10-CM

## 2018-09-06 DIAGNOSIS — Z114 Encounter for screening for human immunodeficiency virus [HIV]: Secondary | ICD-10-CM | POA: Diagnosis not present

## 2018-09-06 DIAGNOSIS — G4700 Insomnia, unspecified: Secondary | ICD-10-CM

## 2018-09-06 DIAGNOSIS — E0590 Thyrotoxicosis, unspecified without thyrotoxic crisis or storm: Secondary | ICD-10-CM

## 2018-09-06 LAB — CBC WITH DIFFERENTIAL/PLATELET
Basophils Absolute: 0 10*3/uL (ref 0.0–0.1)
Basophils Relative: 0.3 % (ref 0.0–3.0)
Eosinophils Absolute: 0.1 10*3/uL (ref 0.0–0.7)
Eosinophils Relative: 1.6 % (ref 0.0–5.0)
HCT: 40.1 % (ref 36.0–46.0)
Hemoglobin: 13.2 g/dL (ref 12.0–15.0)
Lymphocytes Relative: 45.4 % (ref 12.0–46.0)
Lymphs Abs: 2.6 10*3/uL (ref 0.7–4.0)
MCHC: 32.9 g/dL (ref 30.0–36.0)
MCV: 83.4 fl (ref 78.0–100.0)
Monocytes Absolute: 0.4 10*3/uL (ref 0.1–1.0)
Monocytes Relative: 6.3 % (ref 3.0–12.0)
NEUTROS PCT: 46.4 % (ref 43.0–77.0)
Neutro Abs: 2.6 10*3/uL (ref 1.4–7.7)
Platelets: 246 10*3/uL (ref 150.0–400.0)
RBC: 4.81 Mil/uL (ref 3.87–5.11)
RDW: 14.1 % (ref 11.5–15.5)
WBC: 5.7 10*3/uL (ref 4.0–10.5)

## 2018-09-06 LAB — URINALYSIS, ROUTINE W REFLEX MICROSCOPIC
Bilirubin Urine: NEGATIVE
Ketones, ur: NEGATIVE
LEUKOCYTES UA: NEGATIVE
Nitrite: NEGATIVE
Specific Gravity, Urine: 1.01 (ref 1.000–1.030)
Total Protein, Urine: NEGATIVE
Urine Glucose: NEGATIVE
Urobilinogen, UA: 0.2 (ref 0.0–1.0)
WBC, UA: NONE SEEN (ref 0–?)
pH: 6 (ref 5.0–8.0)

## 2018-09-06 LAB — LIPID PANEL
CHOL/HDL RATIO: 4
Cholesterol: 153 mg/dL (ref 0–200)
HDL: 34.9 mg/dL — ABNORMAL LOW (ref >39.00)
LDL Cholesterol: 88 mg/dL (ref 0–99)
NonHDL: 118.39
TRIGLYCERIDES: 153 mg/dL — AB (ref 0.0–149.0)
VLDL: 30.6 mg/dL (ref 0.0–40.0)

## 2018-09-06 LAB — BASIC METABOLIC PANEL
BUN: 11 mg/dL (ref 6–23)
CO2: 30 mEq/L (ref 19–32)
Calcium: 10.8 mg/dL — ABNORMAL HIGH (ref 8.4–10.5)
Chloride: 101 mEq/L (ref 96–112)
Creatinine, Ser: 0.4 mg/dL (ref 0.40–1.20)
GFR: 169.15 mL/min (ref >60.00)
Glucose, Bld: 101 mg/dL — ABNORMAL HIGH (ref 70–99)
Potassium: 3.9 mEq/L (ref 3.5–5.1)
Sodium: 140 mEq/L (ref 135–145)

## 2018-09-06 LAB — HEPATIC FUNCTION PANEL
ALK PHOS: 100 U/L (ref 39–117)
ALT: 32 U/L (ref 0–35)
AST: 26 U/L (ref 0–37)
Albumin: 4.4 g/dL (ref 3.5–5.2)
Bilirubin, Direct: 0.1 mg/dL (ref 0.0–0.3)
Total Bilirubin: 0.6 mg/dL (ref 0.2–1.2)
Total Protein: 7.5 g/dL (ref 6.0–8.3)

## 2018-09-06 LAB — TSH: TSH: <0.01 u[IU]/mL — ABNORMAL LOW (ref 0.35–4.50)

## 2018-09-06 MED ORDER — ZOLPIDEM TARTRATE 10 MG PO TABS: 10.0000 mg | ORAL_TABLET | Freq: Every evening | ORAL | 5 refills | Status: DC | PRN

## 2018-09-06 NOTE — Progress Notes (Signed)
Subjective:    Patient ID: [name], female    DOB: 20-Nov-1970, 48 y.o.   MRN: [id number]  HPI  Here for wellness and f/u;  Overall doing ok;  Pt denies Chest pain, worsening SOB, DOE, wheezing, orthopnea, PND, worsening LE edema, palpitations, dizziness or syncope.  Pt denies neurological change such as new headache, facial or extremity weakness.  Pt denies polydipsia, polyuria, or low sugar symptoms. Pt states overall good compliance with treatment and medications, good tolerability, and has been trying to follow appropriate diet.  Pt denies worsening depressive symptoms, suicidal ideation or panic. No fever, night sweats, wt loss, loss of appetite, or other constitutional symptoms.  Pt states good ability with ADL's, has low fall risk, home safety reviewed and adequate, no other significant changes in hearing or vision, and only occasionally active with exercise.  Now divorced and spends more time in th gym.  No new complaints except for mild persistent ongoing insomnia for several months, just cant fall asleep Past Medical History:  Diagnosis Date  . Abdominal pain, left lower quadrant 03/26/2008  . ABDOMINAL PAIN, SUPRAPUBIC 08/04/2007  . ALLERGIC RHINITIS 08/04/2007  . Anemia   . ANXIETY 08/04/2007  . Cough    instructed to see primary care if worsens-no fever  . DEPRESSION 08/04/2007  . FATIGUE 08/04/2007  . GERD 08/04/2007  . Headache(784.0)   . Hyperlipidemia 05/20/2014  . Insomnia   . SINUSITIS- ACUTE-NOS 07/25/2010  . SVD (spontaneous vaginal delivery)    x 4   Past Surgical History:  Procedure Laterality Date  . ABDOMINAL HYSTERECTOMY  2004  . LAPAROSCOPY  03/23/2012   Procedure: LAPAROSCOPY OPERATIVE;  Surgeon: Margarette Asal, MD;  Location: [facility];  Service: Gynecology;  Laterality: N/A;  with CO2 laser ablation of Vin;need microscope,vaginal vault excision of keloid;will want kenolog 40 from pharmacy.  Marland Kitchen LAPAROTOMY  05/02/2012   Procedure: EXPLORATORY LAPAROTOMY;   Surgeon: Margarette Asal, MD;  Location: [facility];  Service: Gynecology;  Laterality: N/A;  . RHINOPLASTY     nose  . SALPINGOOPHORECTOMY  05/02/2012   Procedure: SALPINGO OOPHERECTOMY;  Surgeon: Margarette Asal, MD;  Location: [facility];  Service: Gynecology;  Laterality: Bilateral;  bilateral  . SCAR REVISION  03/23/2012   Procedure: SCAR REVISION;  Surgeon: Margarette Asal, MD;  Location: [facility];  Service: Gynecology;  Laterality: N/A;  . SCAR REVISION N/A 08/08/2013   Procedure: SCAR REVISION ABDOMINAL;  Surgeon: Margarette Asal, MD;  Location: [facility];  Service: Gynecology;  Laterality: N/A;  . TUBAL LIGATION    . WISDOM TOOTH EXTRACTION      reports that she has never smoked. She has never used smokeless tobacco. She reports that she does not drink alcohol or use drugs. family history includes Asthma in her mother. Allergies  Allergen Reactions  . Latex Shortness Of Breath, Swelling and Rash  . Other Itching and Nausea And Vomiting    Pain medication given after surgery; pt doesn't remember name.  Most likely opioid-derived.   Current Outpatient Medications on File Prior to Visit  Medication Sig Dispense Refill  . chlorpheniramine-HYDROcodone (TUSSIONEX PENNKINETIC ER) 10-8 MG/5ML SUER Take 5 mLs by mouth every 12 (twelve) hours as needed for cough. 140 mL 0  . cyclobenzaprine (FLEXERIL) 5 MG tablet Take 1 tablet (5 mg total) by mouth 3 (three) times daily as needed for muscle spasms. 60 tablet 1  . triamcinolone (KENALOG) 0.1 % paste Use as directed 1 application in the  mouth or throat 2 (two) times daily. 5 g 12  . escitalopram (LEXAPRO) 10 MG tablet Take 1 tablet (10 mg total) by mouth daily. 90 tablet 3   No current facility-administered medications on file prior to visit.    Review of Systems Constitutional: Negative for other unusual diaphoresis, sweats, appetite or weight changes HENT: Negative for other worsening hearing loss, ear pain, facial swelling, mouth sores or neck  stiffness.   Eyes: Negative for other worsening pain, redness or other visual disturbance.  Respiratory: Negative for other stridor or swelling Cardiovascular: Negative for other palpitations or other chest pain  Gastrointestinal: Negative for worsening diarrhea or loose stools, blood in stool, distention or other pain Genitourinary: Negative for hematuria, flank pain or other change in urine volume.  Musculoskeletal: Negative for myalgias or other joint swelling.  Skin: Negative for other color change, or other wound or worsening drainage.  Neurological: Negative for other syncope or numbness. Hematological: Negative for other adenopathy or swelling Psychiatric/Behavioral: Negative for hallucinations, other worsening agitation, SI, self-injury, or new decreased concentration All other system neg per pt    Objective:   Physical Exam BP 132/84   Pulse 93   Temp 97.9 F (36.6 C) (Oral)   Ht 5\' 7"  (1.702 m)   Wt 168 lb (76.2 kg)   SpO2 95%   BMI 26.31 kg/m  VS noted,  Constitutional: Pt is oriented to person, place, and time. Appears well-developed and well-nourished, in no significant distress and comfortable Head: Normocephalic and atraumatic  Eyes: Conjunctivae and EOM are normal. Pupils are equal, round, and reactive to light Right Ear: External ear normal without discharge Left Ear: External ear normal without discharge Nose: Nose without discharge or deformity Mouth/Throat: Oropharynx is without other ulcerations and moist  Neck: Normal range of motion. Neck supple. No JVD present. No tracheal deviation present or significant neck LA or mass Cardiovascular: Normal rate, regular rhythm, normal heart sounds and intact distal pulses.   Pulmonary/Chest: WOB normal and breath sounds without rales or wheezing  Abdominal: Soft. Bowel sounds are normal. NT. No HSM  Musculoskeletal: Normal range of motion. Exhibits no edema Lymphadenopathy: Has no other cervical adenopathy.    Neurological: Pt is alert and oriented to person, place, and time. Pt has normal reflexes. No cranial nerve deficit. Motor grossly intact, Gait intact Skin: Skin is warm and dry. No rash noted or new ulcerations Psychiatric:  Has mild dysphoric mood and affect. Behavior is normal without agitation No other exam findings Lab Results  Component Value Date   WBC 5.7 09/06/2018   HGB 13.2 09/06/2018   HCT 40.1 09/06/2018   PLT 246.0 09/06/2018   GLUCOSE 101 (H) 09/06/2018   CHOL 153 09/06/2018   TRIG 153.0 (H) 09/06/2018   HDL 34.90 (L) 09/06/2018   LDLCALC 88 09/06/2018   ALT 32 09/06/2018   AST 26 09/06/2018   NA 140 09/06/2018   K 3.9 09/06/2018   CL 101 09/06/2018   CREATININE 0.40 09/06/2018   BUN 11 09/06/2018   CO2 30 09/06/2018   TSH <0.01 (L) 09/06/2018      Assessment & Plan:

## 2018-09-06 NOTE — Telephone Encounter (Signed)
Called pt, LVM.   CRM created.  

## 2018-09-06 NOTE — Telephone Encounter (Signed)
-----   Message from Biagio Borg, MD sent at 09/06/2018 12:55 PM EST ----- Letter sent, cont same tx except  The test results show that your current treatment is OK, except the Thyroid test is consistent with overactive thyroid condition (hyperthyroidism).  We will need to refer you to Endocrinology regarding any further evaluation or treatment needed.[name] to please inform pt, I will do referral

## 2018-09-06 NOTE — Assessment & Plan Note (Addendum)
Declines statin, o/w stable overall by history and exam, recent data reviewed with pt, and pt to continue medical treatment as before,  to f/u any worsening symptoms or concerns

## 2018-09-06 NOTE — Patient Instructions (Signed)
Please take all new medication as prescribed - the ambien for sleep  Please continue all other medications as before, and refills have been done if requested.  Please have the pharmacy call with any other refills you may need.  Please continue your efforts at being more active, low cholesterol diet, and weight control.  You are otherwise up to date with prevention measures today.  Please keep your appointments with your specialists as you may have planned  Please go to the LAB in the Basement (turn left off the elevator) for the tests to be done today  You will be contacted by phone if any changes need to be made immediately.  Otherwise, you will receive a letter about your results with an explanation, but please check with MyChart first.  Please remember to sign up for MyChart if you have not done so, as this will be important to you in the future with finding out test results, communicating by private email, and scheduling acute appointments online when needed.  Please return in 1 year for your yearly visit, or sooner if needed, with Lab testing done 3-5 days before

## 2018-09-06 NOTE — Assessment & Plan Note (Signed)

## 2018-09-06 NOTE — Assessment & Plan Note (Signed)
Mild, for ambien prn,  to f/u any worsening symptoms or concerns

## 2018-09-12 ENCOUNTER — Ambulatory Visit: Payer: Self-pay

## 2018-09-12 NOTE — Telephone Encounter (Signed)
FYI

## 2018-09-12 NOTE — Telephone Encounter (Signed)
  Pt called to say her glands were swollen on both sides of her neck She states that her throat is rea sore when she swallows.  She has a cough. She is unsure that she has a fever but dose feel ache and has chills sometimes. She has been treating her symptoms with BC powder. Pt was cautioned that the medication could cause gastric upset. Pt states she is sometimes SOB when coughing.  Pt is speaking in full sentences. Appointment scheduled per protocol and pt preference. Care advice read to patient. Pt verbalized understanding of all instructions. Reason for Disposition . [1] Swelling is painful to touch AND [2] no fever  Answer Assessment - Initial Assessment Questions 1. APPEARANCE of SWELLING: "What does it look like?" (e.g., lymph node, insect bite, mole)     Both sides of neck lymph node  2. SIZE: "How large is the swelling?" (inches, cm or compare to coins)     swallowing hurts unsure of how swollen 3. LOCATION: "Where is the swelling located?"    neck 4. ONSET: "When did the swelling start?"     Last night 5. PAIN: "Is it painful?" If so, ask: "How much?"    prickling sensation 6. ITCH: "Does it itch?" If so, ask: "How much?"     No 7. CAUSE: "What do you think caused the swelling?" unsure 8. OTHER SYMPTOMS: "Do you have any other symptoms?" (e.g., fever)     Chills, cough  Protocols used: SKIN LUMP OR LOCALIZED SWELLING-A-AH

## 2018-09-13 ENCOUNTER — Ambulatory Visit (INDEPENDENT_AMBULATORY_CARE_PROVIDER_SITE_OTHER): Payer: Managed Care, Other (non HMO) | Admitting: Internal Medicine

## 2018-09-13 ENCOUNTER — Encounter: Payer: Self-pay | Admitting: Internal Medicine

## 2018-09-13 VITALS — BP 124/78 | HR 107 | Temp 98.3°F | Ht 67.0 in | Wt 152.0 lb

## 2018-09-13 DIAGNOSIS — E0590 Thyrotoxicosis, unspecified without thyrotoxic crisis or storm: Secondary | ICD-10-CM | POA: Diagnosis not present

## 2018-09-13 DIAGNOSIS — L299 Pruritus, unspecified: Secondary | ICD-10-CM

## 2018-09-13 DIAGNOSIS — J029 Acute pharyngitis, unspecified: Secondary | ICD-10-CM | POA: Diagnosis not present

## 2018-09-13 DIAGNOSIS — G4700 Insomnia, unspecified: Secondary | ICD-10-CM | POA: Diagnosis not present

## 2018-09-13 DIAGNOSIS — F411 Generalized anxiety disorder: Secondary | ICD-10-CM

## 2018-09-13 MED ORDER — HYDROXYZINE HCL 10 MG PO TABS: 10.0000 mg | ORAL_TABLET | Freq: Three times a day (TID) | ORAL | 1 refills | Status: DC | PRN

## 2018-09-13 MED ORDER — TEMAZEPAM 30 MG PO CAPS: 30.0000 mg | ORAL_CAPSULE | Freq: Every evening | ORAL | 2 refills | Status: DC | PRN

## 2018-09-13 MED ORDER — ESCITALOPRAM OXALATE 10 MG PO TABS: 10.0000 mg | ORAL_TABLET | Freq: Every day | ORAL | 3 refills | Status: DC

## 2018-09-13 MED ORDER — HYDROCOD POLST-CPM POLST ER 10-8 MG/5ML PO SUER: 5.0000 mL | Freq: Two times a day (BID) | ORAL | 0 refills | Status: DC | PRN

## 2018-09-13 MED ORDER — AZITHROMYCIN 250 MG PO TABS: ORAL_TABLET | ORAL | 1 refills | Status: DC

## 2018-09-13 NOTE — Assessment & Plan Note (Signed)
Mild worse with new hyperthyroid - for restart lexapro 10 qd

## 2018-09-13 NOTE — Progress Notes (Signed)
Subjective:    Patient ID: [name], female    DOB: 1970-10-26, 48 y.o.   MRN: [id number]  HPI  Here to f/u,  Here with 2-3 days acute onset fever, severe ST pain, pressure, headache, general weakness and malaise, and greenish d/c, with mild ST and cough, but pt denies chest pain, wheezing, increased sob or doe, orthopnea, PND, increased LE swelling, palpitations, dizziness or syncope.  Has lost considerable wt recently with her newly found hyperthyroidism, also c/o itching and difficulty sleeping. Has been referred to endocrinology. ambien not working well for sleep, still tends to wake up after 1 hour.  Coughing a night has been an issue as well in last few nights.   Has been more anxious in this condition, and asks for refill lexapro that she has deferred taking recently Wt Readings from Last 3 Encounters:  09/13/18 152 lb (68.9 kg)  09/06/18 168 lb (76.2 kg)  10/27/17 174 lb (78.9 kg)   Past Medical History:  Diagnosis Date  . Abdominal pain, left lower quadrant 03/26/2008  . ABDOMINAL PAIN, SUPRAPUBIC 08/04/2007  . ALLERGIC RHINITIS 08/04/2007  . Anemia   . ANXIETY 08/04/2007  . Cough    instructed to see primary care if worsens-no fever  . DEPRESSION 08/04/2007  . FATIGUE 08/04/2007  . GERD 08/04/2007  . Headache(784.0)   . Hyperlipidemia 05/20/2014  . Insomnia   . SINUSITIS- ACUTE-NOS 07/25/2010  . SVD (spontaneous vaginal delivery)    x 4   Past Surgical History:  Procedure Laterality Date  . ABDOMINAL HYSTERECTOMY  2004  . LAPAROSCOPY  03/23/2012   Procedure: LAPAROSCOPY OPERATIVE;  Surgeon: Margarette Asal, MD;  Location: [facility];  Service: Gynecology;  Laterality: N/A;  with CO2 laser ablation of Vin;need microscope,vaginal vault excision of keloid;will want kenolog 40 from pharmacy.  Marland Kitchen LAPAROTOMY  05/02/2012   Procedure: EXPLORATORY LAPAROTOMY;  Surgeon: Margarette Asal, MD;  Location: [facility];  Service: Gynecology;  Laterality: N/A;  . RHINOPLASTY     nose  .  SALPINGOOPHORECTOMY  05/02/2012   Procedure: SALPINGO OOPHERECTOMY;  Surgeon: Margarette Asal, MD;  Location: [facility];  Service: Gynecology;  Laterality: Bilateral;  bilateral  . SCAR REVISION  03/23/2012   Procedure: SCAR REVISION;  Surgeon: Margarette Asal, MD;  Location: [facility];  Service: Gynecology;  Laterality: N/A;  . SCAR REVISION N/A 08/08/2013   Procedure: SCAR REVISION ABDOMINAL;  Surgeon: Margarette Asal, MD;  Location: [facility];  Service: Gynecology;  Laterality: N/A;  . TUBAL LIGATION    . WISDOM TOOTH EXTRACTION      reports that she has never smoked. She has never used smokeless tobacco. She reports that she does not drink alcohol or use drugs. family history includes Asthma in her mother. Allergies  Allergen Reactions  . Latex Shortness Of Breath, Swelling and Rash  . Other Itching and Nausea And Vomiting    Pain medication given after surgery; pt doesn't remember name.  Most likely opioid-derived.   Current Outpatient Medications on File Prior to Visit  Medication Sig Dispense Refill  . cyclobenzaprine (FLEXERIL) 5 MG tablet Take 1 tablet (5 mg total) by mouth 3 (three) times daily as needed for muscle spasms. 60 tablet 1  . triamcinolone (KENALOG) 0.1 % paste Use as directed 1 application in the mouth or throat 2 (two) times daily. 5 g 12   No current facility-administered medications on file prior to visit.    Review of Systems  Constitutional: Negative  for other unusual diaphoresis or sweats HENT: Negative for ear discharge or swelling Eyes: Negative for other worsening visual disturbances Respiratory: Negative for stridor or other swelling  Gastrointestinal: Negative for worsening distension or other blood Genitourinary: Negative for retention or other urinary change Musculoskeletal: Negative for other MSK pain or swelling Skin: Negative for color change or other new lesions Neurological: Negative for worsening tremors and other numbness  Psychiatric/Behavioral:  Negative for worsening agitation or other fatigue All other system neg per pt    Objective:   Physical Exam BP 124/78   Pulse (!) 107   Temp 98.3 F (36.8 C) (Oral)   Ht 5\' 7"  (1.702 m)   Wt 152 lb (68.9 kg)   SpO2 98%   BMI 23.81 kg/m  VS noted, mild ill Constitutional: Pt appears in NAD HENT: Head: NCAT.  Right Ear: External ear normal.  Left Ear: External ear normal.  Eyes: . Pupils are equal, round, and reactive to light. Conjunctivae and EOM are normal Bilat tm's with mild erythema.  Max sinus areas non tender.  Pharynx with severe erythema, no exudate Nose: without d/c or deformity, with bilateral  Neck: Neck supple. Gross normal ROM Cardiovascular: Normal rate and regular rhythm.   Pulmonary/Chest: Effort normal and breath sounds without rales or wheezing.  Abd:  Soft, NT, ND, + BS, no organomegaly Neurological: Pt is alert. At baseline orientation, motor grossly intact Skin: Skin is warm. No rashes, other new lesions, no LE edema Psychiatric: Pt behavior is normal without agitation  No other exam findings Lab Results  Component Value Date   WBC 5.7 09/06/2018   HGB 13.2 09/06/2018   HCT 40.1 09/06/2018   PLT 246.0 09/06/2018   GLUCOSE 101 (H) 09/06/2018   CHOL 153 09/06/2018   TRIG 153.0 (H) 09/06/2018   HDL 34.90 (L) 09/06/2018   LDLCALC 88 09/06/2018   ALT 32 09/06/2018   AST 26 09/06/2018   NA 140 09/06/2018   K 3.9 09/06/2018   CL 101 09/06/2018   CREATININE 0.40 09/06/2018   BUN 11 09/06/2018   CO2 30 09/06/2018   TSH <0.01 (L) 09/06/2018          Assessment & Plan:

## 2018-09-13 NOTE — Assessment & Plan Note (Signed)
No rash, ok for atarax 10 prn,  to f/u any worsening symptoms or concerns

## 2018-09-13 NOTE — Patient Instructions (Signed)
Please take all new medication as prescribed - the antibiotic, cough medicine as needed, and temazepam for sleep, and hydroxyzine for itching as needed  OK to stop the [place] since not working well  Please continue all other medications as before, and refills have been done if requested - the lexapro  Please have the pharmacy call with any other refills you may need.  Please keep your appointments with your specialists as you may have planned - Endocrinology for the overactive thyroid

## 2018-09-13 NOTE — Assessment & Plan Note (Addendum)
Mild to mod, for antibx course,  to f/u any worsening symptoms or concerns  Note:  Total time for pt hx, exam, review of record with pt in the room, determination of diagnoses and plan for further eval and tx is > 40 min, with over 50% spent in coordination and counseling of patient including the differential dx, tx, further evaluation and other management of acute pharyngitis, itching, anxiety, hyperthyroidism, and insomnia

## 2018-09-13 NOTE — Assessment & Plan Note (Signed)
For endo f/u as referred

## 2018-09-13 NOTE — Assessment & Plan Note (Signed)
Ok for trial change to restoril 30 qhs prn

## 2018-10-07 ENCOUNTER — Encounter: Payer: Self-pay | Admitting: Endocrinology

## 2018-10-07 ENCOUNTER — Ambulatory Visit (INDEPENDENT_AMBULATORY_CARE_PROVIDER_SITE_OTHER): Payer: Managed Care, Other (non HMO) | Admitting: Endocrinology

## 2018-10-07 VITALS — BP 110/70 | HR 83 | Ht 67.0 in | Wt 147.4 lb

## 2018-10-07 DIAGNOSIS — E0590 Thyrotoxicosis, unspecified without thyrotoxic crisis or storm: Secondary | ICD-10-CM | POA: Diagnosis not present

## 2018-10-07 MED ORDER — METHIMAZOLE 10 MG PO TABS: 10.0000 mg | ORAL_TABLET | Freq: Two times a day (BID) | ORAL | 3 refills | Status: DC

## 2018-10-07 NOTE — Patient Instructions (Addendum)
I have sent a prescription to your pharmacy, to slow the thyroid. If ever you have fever while taking methimazole, stop it and call us, even if the reason is obvious, because of the risk of a rare side-effect. Please come back for a follow-up appointment in 1 month.     Hyperthyroidism  Hyperthyroidism is when the thyroid gland is too active (overactive). The thyroid gland is a small gland located in the lower front part of the neck, just in front of the windpipe (trachea). This gland makes hormones that help control how the body uses food for energy (metabolism) as well as how the heart and brain function. These hormones also play a role in keeping your bones strong. When the thyroid is overactive, it produces too much of a hormone called thyroxine. What are the causes? This condition may be caused by:  Graves' disease. This is a disorder in which the body's disease-fighting system (immune system) attacks the thyroid gland. This is the most common cause.  Inflammation of the thyroid gland.  A tumor in the thyroid gland.  Use of certain medicines, including: ? Prescription thyroid hormone replacement. ? Herbal supplements that mimic thyroid hormones. ? Amiodarone therapy.  Solid or fluid-filled lumps within your thyroid gland (thyroid nodules).  Taking in a large amount of iodine from foods or medicines. What increases the risk? You are more likely to develop this condition if:  You are female.  You have a family history of thyroid conditions.  You smoke tobacco.  You use a medicine called lithium.  You take medicines that affect the immune system (immunosuppressants). What are the signs or symptoms? Symptoms of this condition include:  Nervousness.  Inability to tolerate heat.  Unexplained weight loss.  Diarrhea.  Change in the texture of hair or skin.  Heart skipping beats or making extra beats.  Rapid heart rate.  Loss of menstruation.  Shaky  hands.  Fatigue.  Restlessness.  Sleep problems.  Enlarged thyroid gland or a lump in the thyroid (nodule). You may also have symptoms of Graves' disease, which may include:  Protruding eyes.  Dry eyes.  Red or swollen eyes.  Problems with vision. How is this diagnosed? This condition may be diagnosed based on:  Your symptoms and medical history.  A physical exam.  Blood tests.  Thyroid ultrasound. This test involves using sound waves to produce images of the thyroid gland.  A thyroid scan. A radioactive substance is injected into a vein, and images show how much iodine is present in the thyroid.  Radioactive iodine uptake test (RAIU). A small amount of radioactive iodine is given by mouth to see how much iodine the thyroid absorbs after a certain amount of time. How is this treated? Treatment depends on the cause and severity of the condition. Treatment may include:  Medicines to reduce the amount of thyroid hormone your body makes.  Radioactive iodine treatment (radioiodine therapy). This involves swallowing a small dose of radioactive iodine, in capsule or liquid form, to kill thyroid cells.  Surgery to remove part or all of your thyroid gland. You may need to take thyroid hormone replacement medicine for the rest of your life after thyroid surgery.  Medicines to help manage your symptoms. Follow these instructions at home:   Take over-the-counter and prescription medicines only as told by your health care provider.  Do not use any products that contain nicotine or tobacco, such as cigarettes and e-cigarettes. If you need help quitting, ask your health care  provider.  Follow any instructions from your health care provider about diet. You may be instructed to limit foods that contain iodine.  Keep all follow-up visits as told by your health care provider. This is important. ? You will need to have blood tests regularly so that your health care provider can  monitor your condition. Contact a health care provider if:  Your symptoms do not get better with treatment.  You have a fever.  You are taking thyroid hormone replacement medicine and you: ? Have symptoms of depression. ? Feel like you are tired all the time. ? Gain weight. Get help right away if:  You have chest pain.  You have decreased alertness or a change in your awareness.  You have abdominal pain.  You feel dizzy.  You have a rapid heartbeat.  You have an irregular heartbeat.  You have difficulty breathing. Summary  The thyroid gland is a small gland located in the lower front part of the neck, just in front of the windpipe (trachea).  Hyperthyroidism is when the thyroid gland is too active (overactive) and produces too much of a hormone called thyroxine.  The most common cause is Graves' disease, a disorder in which your immune system attacks the thyroid gland.  Hyperthyroidism can cause various symptoms, such as unexplained weight loss, nervousness, inability to tolerate heat, or changes in your heartbeat.  Treatment may include medicine to reduce the amount of thyroid hormone your body makes, radioiodine therapy, surgery, or medicines to manage symptoms. This information is not intended to replace advice given to you by your health care provider. Make sure you discuss any questions you have with your health care provider. Document Released: 08/03/2005 Document Revised: 07/14/2017 Document Reviewed: 07/14/2017 [employer] Interactive Patient Education  2019 [employer].   ??????????????????????????????????????????????????????????????????? ???????????????????????????????? methimazole ????????????????????????????????????????????????????????????????????????????? ??????????????????????????????????????????? ? ???  ??????????????????? hyperthyroidism ????????????????????????????????? (????????) ?  ?????????????????????????????????????????????????????????????????????????????????????????????????????? (???????) ? ?????????????????????????????????????????????????????????????????????? (??????????) ???????????????????????????????????????? ??????????????????????????????????????????????????????????????? ?????????????????????????????????????????????????????????????? thyroxine ????????? ????????????????????? ????????????????????????? ?????????? ?????????????????????????????????? (?????????????????????????) ????????????????????????? ?????????????????????????? ????????????????????????? ?????????????????????????????? ????????????????????????????????? ????????????????????????????????????? ??????????????????????????????????????????????????? ????????????? Amiodarone ? ????????????????????????????????????????????????? ?????????????????????????????????????? ???????????????????? ???????????????????????????????????????? ??????????????? ?????????????????????????????????????????????????? ???????????? ???????????????????????????? ???????????????????????????????????????? (?????????????????) ? ???????????????????????????? ????????????????????????? ???? ?????????????????????????????? ????????????????????????????? ???? ??????????????????????????????????? ?????????????????????????????????? ???????????????????? ???????????????????? ?????? ??????????? ???????????? ?????????????? ???????????????????????????????????????????????????? (????????) ? ????????????????????????????????????????????? ??????????????? ??????????? ??????????????? ?????????????????????  ??????????????????????????????????????????? ??????????????????????????????????????????????????? ???????????????????????????????????????? ????????????????? ????????? ????????????????????? ?????????????????????????????????????????????????????????????????????????????? ????????????????????????  ???????????????????????????????????????????????????????????????????????????????????????????????????????????????????? ?????????????????????????????? (RAIU) ? ?????????????????????????????????????????????????????????????????????????????????????????????????????????????????????????????????????? ?????????????????????????????????? ???????????????????????????????????????????????????? ???????????????????? ??????????????????????????????????????????????????????????? ???????????????????????????? (????????????????????????) ? ???????????????????????????????????????????????????????????????????????????????????????????????????????????? ??????????????????????????????????????????????? ??????????????????????????????????????????????????????????????????????????????????????? ???????????????????????????????????????? ???????????????????????????????? ???????????????????????????????????????????????????????????????????????????? ?????????????????????????????????????????????????????????????????????? ?????????????????????????????????????????????????????????????????????? ??????????????????????????????????????????????????????????????????? ??????????????????????????????????????????????????? ???????????????????????????????????????????????????????????????????????? ???????????? ???????????????????????????????????????????????????????????????????????????????????? ??????????????????????????????????????? ???????????????????????????????????????????????? ????????????????? ?????????????????????????????????????????????? ???????????????????????????????? ??????????????????????????????????? ?????????? ?????????????????????? ???????????? ???????????????????????????????????????????????????????????????? ?????????? ?????????????????????? ??????????????????????????? ?????????????????????????????????? ?????????????????? ?????? ?????????????????????????????????????????????????????????????????????????????????????????????????????? (???????) ? hyperthyroidism  ????????????????????????????????? (???????) ????????????????????????? thyroxine ? ???????????????????????????????????????????????????????????????????????????????????????????? ?????????????????????????????????????????????????????????????????????????????????????????, ???, ?????????????????????????????????????????????????????????????????? ??????????????????????????????????????????????????????????????????????????????????????????????????????????????????????????????????????????????????????????????

## 2018-10-07 NOTE — Progress Notes (Signed)
Subjective:    Patient ID: [name], female    DOB: 25-Apr-1971, 48 y.o.   MRN: [id number]  HPI Pt is referred by Dr Jenny Reichmann, for hyperthyroidism.  Pt reports she was dx'ed with hyperthyroidism in early 2020, on routine labs (it was normal in early 2019).  she has never been on therapy for this.  she has never had XRT to the anterior neck, or thyroid surgery.  she has never had thyroid imaging.  He does not consume kelp or any other prescribed or non-prescribed thyroid medication.  He has never been on amiodarone.  She has moderate fatigue, and assoc anxiety.   Past Medical History:  Diagnosis Date  . Abdominal pain, left lower quadrant 03/26/2008  . ABDOMINAL PAIN, SUPRAPUBIC 08/04/2007  . ALLERGIC RHINITIS 08/04/2007  . Anemia   . ANXIETY 08/04/2007  . Cough    instructed to see primary care if worsens-no fever  . DEPRESSION 08/04/2007  . FATIGUE 08/04/2007  . GERD 08/04/2007  . Headache(784.0)   . Hyperlipidemia 05/20/2014  . Insomnia   . SINUSITIS- ACUTE-NOS 07/25/2010  . SVD (spontaneous vaginal delivery)    x 4    Past Surgical History:  Procedure Laterality Date  . ABDOMINAL HYSTERECTOMY  2004  . LAPAROSCOPY  03/23/2012   Procedure: LAPAROSCOPY OPERATIVE;  Surgeon: Margarette Asal, MD;  Location: [facility];  Service: Gynecology;  Laterality: N/A;  with CO2 laser ablation of Vin;need microscope,vaginal vault excision of keloid;will want kenolog 40 from pharmacy.  Marland Kitchen LAPAROTOMY  05/02/2012   Procedure: EXPLORATORY LAPAROTOMY;  Surgeon: Margarette Asal, MD;  Location: [facility];  Service: Gynecology;  Laterality: N/A;  . RHINOPLASTY     nose  . SALPINGOOPHORECTOMY  05/02/2012   Procedure: SALPINGO OOPHERECTOMY;  Surgeon: Margarette Asal, MD;  Location: [facility];  Service: Gynecology;  Laterality: Bilateral;  bilateral  . SCAR REVISION  03/23/2012   Procedure: SCAR REVISION;  Surgeon: Margarette Asal, MD;  Location: [facility];  Service: Gynecology;  Laterality: N/A;  . SCAR REVISION N/A  08/08/2013   Procedure: SCAR REVISION ABDOMINAL;  Surgeon: Margarette Asal, MD;  Location: [facility];  Service: Gynecology;  Laterality: N/A;  . TUBAL LIGATION    . WISDOM TOOTH EXTRACTION      Social History   Socioeconomic History  . Marital status: Married    Spouse name: Not on file  . Number of children: Not on file  . Years of education: Not on file  . Highest education level: Not on file  Occupational History  . Not on file  Social Needs  . Financial resource strain: Not on file  . Food insecurity:    Worry: Not on file    Inability: Not on file  . Transportation needs:    Medical: Not on file    Non-medical: Not on file  Tobacco Use  . Smoking status: Never Smoker  . Smokeless tobacco: Never Used  Substance and Sexual Activity  . Alcohol use: No  . Drug use: No  . Sexual activity: Yes    Birth control/protection: Surgical  Lifestyle  . Physical activity:    Days per week: Not on file    Minutes per session: Not on file  . Stress: Not on file  Relationships  . Social connections:    Talks on phone: Not on file    Gets together: Not on file    Attends religious service: Not on file    Active member of club  or organization: Not on file    Attends meetings of clubs or organizations: Not on file    Relationship status: Not on file  . Intimate partner violence:    Fear of current or ex partner: Not on file    Emotionally abused: Not on file    Physically abused: Not on file    Forced sexual activity: Not on file  Other Topics Concern  . Not on file  Social History Narrative  . Not on file    Current Outpatient Medications on File Prior to Visit  Medication Sig Dispense Refill  . cyclobenzaprine (FLEXERIL) 5 MG tablet Take 1 tablet (5 mg total) by mouth 3 (three) times daily as needed for muscle spasms. 60 tablet 1  . escitalopram (LEXAPRO) 10 MG tablet Take 1 tablet (10 mg total) by mouth daily. 90 tablet 3  . hydrOXYzine (ATARAX/VISTARIL) 10 MG tablet  Take 1 tablet (10 mg total) by mouth 3 (three) times daily as needed. 40 tablet 1  . temazepam (RESTORIL) 30 MG capsule Take 1 capsule (30 mg total) by mouth at bedtime as needed for sleep. 30 capsule 2  . triamcinolone (KENALOG) 0.1 % paste Use as directed 1 application in the mouth or throat 2 (two) times daily. 5 g 12   No current facility-administered medications on file prior to visit.     Allergies  Allergen Reactions  . Latex Shortness Of Breath, Swelling and Rash  . Other Itching and Nausea And Vomiting    Pain medication given after surgery; pt doesn't remember name.  Most likely opioid-derived.    Family History  Problem Relation Age of Onset  . Asthma Mother   . Thyroid disease Neg Hx     BP 110/70 (BP Location: Left Arm, Patient Position: Sitting, Cuff Size: Normal)   Pulse 83   Ht 5\' 7"  (1.702 m)   Wt 147 lb 6.4 oz (66.9 kg)   SpO2 98%   BMI 23.09 kg/m     Review of Systems denies hoarseness, diplopia, diarrhea, excessive diaphoresis, and heat intolerance.  She has insomnia.  She has lost 17 lbs x a few mos.  She has headache, palpitations, doe, nausea, frequent urination, muscle weakness, ankle swelling, arthralgias, tremor, easy bruising, rhinorrhea, and swelling at the ant neck.       Objective:   Physical Exam VS: see vs page GEN: no distress HEAD: head: no deformity eyes: no periorbital swelling, no proptosis external nose and ears are normal mouth: no lesion seen NECK: thyroid is slightly and diffusely enlarged CHEST WALL: no deformity LUNGS: clear to auscultation CV: reg rate and rhythm, no murmur ABD: abdomen is soft, nontender.  no hepatosplenomegaly.  not distended.  no hernia MUSCULOSKELETAL: muscle bulk and strength are grossly normal.  no obvious joint swelling.  gait is normal and steady EXTEMITIES: no deformity.  no edema PULSES: no carotid bruit NEURO:  cn 2-12 grossly intact.   readily moves all 4's.  sensation is intact to touch on all  4's.  Slight tremor of the hands. SKIN:  Normal texture and temperature.  No rash or suspicious lesion is visible.  Not diaphoretic NODES:  None palpable at the neck PSYCH: alert, well-oriented.  Does not appear anxious nor depressed.    Lab Results  Component Value Date   TSH <0.01 (L) 09/06/2018   I have reviewed outside records, and summarized: Pt was noted to have suppressed TSH, and referred here.  Anxiety was also addressed, and medication was  rx'ed for this      Assessment & Plan:  Hyperthyroidism, new to me, prob due to Grave's Dz.  We discussed rx options.  she declines RAI, due to cost.   Anxiety: we also discussed the fact that her med need will prob decrease with tapazole rx.    Patient Instructions  I have sent a prescription to your pharmacy, to slow the thyroid. If ever you have fever while taking methimazole, stop it and call us, even if the reason is obvious, because of the risk of a rare side-effect. Please come back for a follow-up appointment in 1 month.     Hyperthyroidism  Hyperthyroidism is when the thyroid gland is too active (overactive). The thyroid gland is a small gland located in the lower front part of the neck, just in front of the windpipe (trachea). This gland makes hormones that help control how the body uses food for energy (metabolism) as well as how the heart and brain function. These hormones also play a role in keeping your bones strong. When the thyroid is overactive, it produces too much of a hormone called thyroxine. What are the causes? This condition may be caused by:  Graves' disease. This is a disorder in which the body's disease-fighting system (immune system) attacks the thyroid gland. This is the most common cause.  Inflammation of the thyroid gland.  A tumor in the thyroid gland.  Use of certain medicines, including: ? Prescription thyroid hormone replacement. ? Herbal supplements that mimic thyroid hormones. ? Amiodarone  therapy.  Solid or fluid-filled lumps within your thyroid gland (thyroid nodules).  Taking in a large amount of iodine from foods or medicines. What increases the risk? You are more likely to develop this condition if:  You are female.  You have a family history of thyroid conditions.  You smoke tobacco.  You use a medicine called lithium.  You take medicines that affect the immune system (immunosuppressants). What are the signs or symptoms? Symptoms of this condition include:  Nervousness.  Inability to tolerate heat.  Unexplained weight loss.  Diarrhea.  Change in the texture of hair or skin.  Heart skipping beats or making extra beats.  Rapid heart rate.  Loss of menstruation.  Shaky hands.  Fatigue.  Restlessness.  Sleep problems.  Enlarged thyroid gland or a lump in the thyroid (nodule). You may also have symptoms of Graves' disease, which may include:  Protruding eyes.  Dry eyes.  Red or swollen eyes.  Problems with vision. How is this diagnosed? This condition may be diagnosed based on:  Your symptoms and medical history.  A physical exam.  Blood tests.  Thyroid ultrasound. This test involves using sound waves to produce images of the thyroid gland.  A thyroid scan. A radioactive substance is injected into a vein, and images show how much iodine is present in the thyroid.  Radioactive iodine uptake test (RAIU). A small amount of radioactive iodine is given by mouth to see how much iodine the thyroid absorbs after a certain amount of time. How is this treated? Treatment depends on the cause and severity of the condition. Treatment may include:  Medicines to reduce the amount of thyroid hormone your body makes.  Radioactive iodine treatment (radioiodine therapy). This involves swallowing a small dose of radioactive iodine, in capsule or liquid form, to kill thyroid cells.  Surgery to remove part or all of your thyroid gland. You may need  to take thyroid hormone replacement medicine for the rest  of your life after thyroid surgery.  Medicines to help manage your symptoms. Follow these instructions at home:   Take over-the-counter and prescription medicines only as told by your health care provider.  Do not use any products that contain nicotine or tobacco, such as cigarettes and e-cigarettes. If you need help quitting, ask your health care provider.  Follow any instructions from your health care provider about diet. You may be instructed to limit foods that contain iodine.  Keep all follow-up visits as told by your health care provider. This is important. ? You will need to have blood tests regularly so that your health care provider can monitor your condition. Contact a health care provider if:  Your symptoms do not get better with treatment.  You have a fever.  You are taking thyroid hormone replacement medicine and you: ? Have symptoms of depression. ? Feel like you are tired all the time. ? Gain weight. Get help right away if:  You have chest pain.  You have decreased alertness or a change in your awareness.  You have abdominal pain.  You feel dizzy.  You have a rapid heartbeat.  You have an irregular heartbeat.  You have difficulty breathing. Summary  The thyroid gland is a small gland located in the lower front part of the neck, just in front of the windpipe (trachea).  Hyperthyroidism is when the thyroid gland is too active (overactive) and produces too much of a hormone called thyroxine.  The most common cause is Graves' disease, a disorder in which your immune system attacks the thyroid gland.  Hyperthyroidism can cause various symptoms, such as unexplained weight loss, nervousness, inability to tolerate heat, or changes in your heartbeat.  Treatment may include medicine to reduce the amount of thyroid hormone your body makes, radioiodine therapy, surgery, or medicines to manage symptoms. This  information is not intended to replace advice given to you by your health care provider. Make sure you discuss any questions you have with your health care provider. Document Released: 08/03/2005 Document Revised: 07/14/2017 Document Reviewed: 07/14/2017 Elsevier Interactive Patient Education  2019 [employer].   ??????????????????????????????????????????????????????????????????? ???????????????????????????????? methimazole ????????????????????????????????????????????????????????????????????????????? ??????????????????????????????????????????? ? ???  ??????????????????? hyperthyroidism ????????????????????????????????? (????????) ? ?????????????????????????????????????????????????????????????????????????????????????????????????????? (???????) ? ?????????????????????????????????????????????????????????????????????? (??????????) ???????????????????????????????????????? ??????????????????????????????????????????????????????????????? ?????????????????????????????????????????????????????????????? thyroxine ????????? ????????????????????? ????????????????????????? ?????????? ?????????????????????????????????? (?????????????????????????) ????????????????????????? ?????????????????????????? ????????????????????????? ?????????????????????????????? ????????????????????????????????? ????????????????????????????????????? ??????????????????????????????????????????????????? ????????????? Amiodarone ? ????????????????????????????????????????????????? ?????????????????????????????????????? ???????????????????? ???????????????????????????????????????? ??????????????? ?????????????????????????????????????????????????? ???????????? ???????????????????????????? ???????????????????????????????????????? (?????????????????) ? ???????????????????????????? ????????????????????????? ???? ?????????????????????????????? ????????????????????????????? ???? ??????????????????????????????????? ??????????????????????????????????  ???????????????????? ???????????????????? ?????? ??????????? ???????????? ?????????????? ???????????????????????????????????????????????????? (????????) ? ????????????????????????????????????????????? ??????????????? ??????????? ??????????????? ?????????????????????  ??????????????????????????????????????????? ??????????????????????????????????????????????????? ???????????????????????????????????????? ????????????????? ????????? ????????????????????? ?????????????????????????????????????????????????????????????????????????????? ???????????????????????? ???????????????????????????????????????????????????????????????????????????????????????????????????????????????????? ?????????????????????????????? (RAIU) ? ?????????????????????????????????????????????????????????????????????????????????????????????????????????????????????????????????????? ?????????????????????????????????? ???????????????????????????????????????????????????? ???????????????????? ??????????????????????????????????????????????????????????? ???????????????????????????? (????????????????????????) ? ???????????????????????????????????????????????????????????????????????????????????????????????????????????? ??????????????????????????????????????????????? ??????????????????????????????????????????????????????????????????????????????????????? ???????????????????????????????????????? ???????????????????????????????? ???????????????????????????????????????????????????????????????????????????? ?????????????????????????????????????????????????????????????????????? ?????????????????????????????????????????????????????????????????????? ??????????????????????????????????????????????????????????????????? ??????????????????????????????????????????????????? ???????????????????????????????????????????????????????????????????????? ???????????? ???????????????????????????????????????????????????????????????????????????????????? ???????????????????????????????????????  ???????????????????????????????????????????????? ????????????????? ?????????????????????????????????????????????? ???????????????????????????????? ??????????????????????????????????? ?????????? ?????????????????????? ???????????? ???????????????????????????????????????????????????????????????? ?????????? ?????????????????????? ??????????????????????????? ?????????????????????????????????? ?????????????????? ?????? ?????????????????????????????????????????????????????????????????????????????????????????????????????? (???????) ? hyperthyroidism ????????????????????????????????? (???????) ????????????????????????? thyroxine ? ???????????????????????????????????????????????????????????????????????????????????????????? ?????????????????????????????????????????????????????????????????????????????????????????, ???, ?????????????????????????????????????????????????????????????????? ??????????????????????????????????????????????????????????????????????????????????????????????????????????????????????????????????????????????????????????????

## 2018-10-28 ENCOUNTER — Telehealth: Payer: Self-pay | Admitting: Endocrinology

## 2018-10-28 ENCOUNTER — Ambulatory Visit: Payer: Self-pay | Admitting: *Deleted

## 2018-10-28 NOTE — Telephone Encounter (Signed)
I called patient, she was already at urgent care, checking in----I have advised patient to let them know her airway feels like it's closing up, difficulty with swallowing water, and have them call 911 for her---patient repeated back for understanding

## 2018-10-28 NOTE — Telephone Encounter (Signed)
methimazole (TAPAZOLE) 10 MG tablet   Patient stated she is having some side effects from the medication listed above. She would like a call back to give an update on what side effects she is having

## 2018-10-28 NOTE — Telephone Encounter (Signed)
Notified Tammy of events of events; will route to office for notification.

## 2018-10-28 NOTE — Telephone Encounter (Signed)
Returned pt call. LVM requesting returned call. 

## 2018-10-28 NOTE — Telephone Encounter (Signed)
Pt is coming in Monday at 1:30

## 2018-10-28 NOTE — Telephone Encounter (Signed)
Pt called with complaints of red rash all over her body, severe itching, hard time swallowing, ankle and foot swelling, and facial redness after taking methimazole which was started on 10/07/2018 per Dr Loanne Drilling; the pt also has called Dr Cordelia Pen office and is awaiting for a return call; it was last taken at 0800 on 10/28/2018;  her symptoms have been worsening since 10/27/2018; the pt says that she has taken benadryl and a double dose hydroxyzine and has no relief in symptoms; nurse triage initiated and recommendations made per protocol; the pt would like to speak with Dr Jenny Reichmann before going to the ED;  explained to pt that this information will be sent to the office for notification; she verbalized understanding; the pt can be contacted at [phone] and a message can be left   Reason for Disposition . Patient sounds very sick or weak to the triager  Answer Assessment - Initial Assessment Questions 1. MAIN SYMPTOM: "What is your main symptom?" "How bad is it?"       Hard time swallowing 2. RESPIRATORY STATUS: "Are you having difficulty breathing?"  (e.g., yes/no, wheezing, unable to complete a sentence)      Intermittent shortness of breath; no difficulty at present 3. SWALLOWING: "Can you swallow?" (e.g., yes/no, food, fluid, saliva)      Yes, but has to eat soft food 4. VASCULAR STATUS: "Are you feeling weak?" If so, ask: "Can you stand and walk normally?"     yes 5. ONSET: "When did the reaction start?" (Minutes or hours ago)      Since starting tapizole on 10/07/2018 but worsened on 10/27/2018 6. SUBSTANCE: "What are you reacting to?" "When did the contact occur?"     Tapizole; taking daily  7. PREVIOUS REACTION: "Have you ever reacted to it before?" If so, ask: "What happened that time?"     no 8. EPINEPHRINE: "Do you have an epinephrine autoinjector (e.g., EpiPen)?"     no  Answer Assessment - Initial Assessment Questions 1. APPEARANCE: "What does the rash look like?"      red 2.  LOCATION: "Where is the rash located?"      All over body 3. NUMBER: "How many hives are there?"      Too many to count 4. SIZE: "How big are the hives?" (inches, cm, compare to coins) "Do they all look the same or is there lots of variation in shape and size?"       5. ONSET: "When did the hives begin?" (Hours or days ago)     Itching since 10/07/2018; worsened 10/26/1008 6. ITCHING: "Does it itch?" If so, ask: "How bad is the itch?"    - MILD: doesn't interfere with normal activities   - MODERATE - SEVERE: interferes with work, school, sleep, or other activities      severe 7. RECURRENT PROBLEM: "Have you had hives before?" If so, ask: "When was the last time?" and "What happened that time?"      no 8. TRIGGERS: "Were you exposed to any new food, plant, cosmetic product or animal just before the hives began?"     Pt stated tapizole on 10/07/2018 9. OTHER SYMPTOMS: "Do you have any other symptoms?" (e.g., fever, tongue swelling, difficulty breathing, abdominal pain     Hard time swallowing, intermittent shortness of breath (non at present), ankle and foot swelling 10. PREGNANCY: "Is there any chance you are pregnant?" "When was your last menstrual period?"  Answer Assessment - Initial Assessment Questions 1. APPEARANCE of  RASH: "Describe the rash." (e.g., spots, blisters, raised areas, skin peeling, scaly)     Red, itchy 2. SIZE: "How big are the spots?" (e.g., tip of pen, eraser, coin; inches, centimeters)     Too many to count 3. LOCATION: "Where is the rash located?"     All over body 4. COLOR: "What color is the rash?" (Note: It is difficult to assess rash color in people with darker-colored skin. When this situation occurs, simply ask the caller to describe what they see.)     red 5. ONSET: "When did the rash begin?"     10/07/2018 6. FEVER: "Do you have a fever?" If so, ask: "What is your temperature, how was it measured, and when did it start?"     no 7. ITCHING: "Does the rash  itch?" If so, ask: "How bad is the itch?" (Scale 1-10; or mild, moderate, severe)     severe 8. CAUSE: "What do you think is causing the rash?"   methimazole 9. NEW MEDICATION: "What new medication are you taking?" (e.g., name of antibiotic) "When did you start taking this medication?".    methamizole started 10/07/2018 10. OTHER SYMPTOMS: "Do you have any other symptoms?" (e.g., sore throat, fever, joint pain)       Difficulty swallowing, intermittent shortness of breath, ankle and foot swelling  11. PREGNANCY: "Is there any chance you are pregnant?" "When was your last menstrual period?"  Protocols used: RASH - WIDESPREAD ON DRUGS-A-AH, ANAPHYLAXIS-A-AH, HIVES-A-AH

## 2018-10-28 NOTE — Telephone Encounter (Signed)
Patient went to Urgent Care and had an allergic reaction to methimazole (TAPAZOLE) 10 MG tablet. Urgent Care Dr. Rockey Situ patient to call Dr. Loanne Drilling to tell Dr. Loanne Drilling that patient is allergic to the above medication and is requesting a different medication be prescribed. Please call patient at ph# [phone] to advise asap.

## 2018-10-31 ENCOUNTER — Other Ambulatory Visit: Payer: Self-pay

## 2018-10-31 ENCOUNTER — Ambulatory Visit (INDEPENDENT_AMBULATORY_CARE_PROVIDER_SITE_OTHER): Payer: Managed Care, Other (non HMO) | Admitting: Endocrinology

## 2018-10-31 ENCOUNTER — Encounter: Payer: Self-pay | Admitting: Endocrinology

## 2018-10-31 VITALS — BP 120/72 | HR 86 | Ht 67.0 in | Wt 148.4 lb

## 2018-10-31 DIAGNOSIS — E0590 Thyrotoxicosis, unspecified without thyrotoxic crisis or storm: Secondary | ICD-10-CM

## 2018-10-31 LAB — T4, FREE: Free T4: 1.83 ng/dL — ABNORMAL HIGH (ref 0.60–1.60)

## 2018-10-31 LAB — CBC WITH DIFFERENTIAL/PLATELET
Basophils Absolute: 0 10*3/uL (ref 0.0–0.1)
Basophils Relative: 0.4 % (ref 0.0–3.0)
EOS ABS: 0.3 10*3/uL (ref 0.0–0.7)
Eosinophils Relative: 4 % (ref 0.0–5.0)
HCT: 38.1 % (ref 36.0–46.0)
Hemoglobin: 12.5 g/dL (ref 12.0–15.0)
Lymphocytes Relative: 41.6 % (ref 12.0–46.0)
Lymphs Abs: 2.8 10*3/uL (ref 0.7–4.0)
MCHC: 32.9 g/dL (ref 30.0–36.0)
MCV: 83 fl (ref 78.0–100.0)
Monocytes Absolute: 0.4 10*3/uL (ref 0.1–1.0)
Monocytes Relative: 6.4 % (ref 3.0–12.0)
Neutro Abs: 3.2 10*3/uL (ref 1.4–7.7)
Neutrophils Relative %: 47.6 % (ref 43.0–77.0)
Platelets: 261 10*3/uL (ref 150.0–400.0)
RBC: 4.59 Mil/uL (ref 3.87–5.11)
RDW: 13.7 % (ref 11.5–15.5)
WBC: 6.8 10*3/uL (ref 4.0–10.5)

## 2018-10-31 LAB — TSH: TSH: <0.01 u[IU]/mL — ABNORMAL LOW (ref 0.35–4.50)

## 2018-10-31 MED ORDER — PROPYLTHIOURACIL 50 MG PO TABS: 200.0000 mg | ORAL_TABLET | Freq: Two times a day (BID) | ORAL | 11 refills | Status: DC

## 2018-10-31 NOTE — Progress Notes (Signed)
Subjective:    Patient ID: [name], female    DOB: July 04, 1971, 48 y.o.   MRN: [id number]  HPI  Pt returns for f/u of hyperthyroidism (dx'ed early 2020, on routine labs (it was normal in early 2019); she has never had thyroid imaging; she declines RAI, due to cost.  Pt says tapazole caused moderate itching throughout the body, and assoc rash. She stopped it 3 days ago, and received a steroid injection.  sxs resolved.   Past Medical History:  Diagnosis Date  . Abdominal pain, left lower quadrant 03/26/2008  . ABDOMINAL PAIN, SUPRAPUBIC 08/04/2007  . ALLERGIC RHINITIS 08/04/2007  . Anemia   . ANXIETY 08/04/2007  . Cough    instructed to see primary care if worsens-no fever  . DEPRESSION 08/04/2007  . FATIGUE 08/04/2007  . GERD 08/04/2007  . Headache(784.0)   . Hyperlipidemia 05/20/2014  . Insomnia   . SINUSITIS- ACUTE-NOS 07/25/2010  . SVD (spontaneous vaginal delivery)    x 4    Past Surgical History:  Procedure Laterality Date  . ABDOMINAL HYSTERECTOMY  2004  . LAPAROSCOPY  03/23/2012   Procedure: LAPAROSCOPY OPERATIVE;  Surgeon: Margarette Asal, MD;  Location: [facility];  Service: Gynecology;  Laterality: N/A;  with CO2 laser ablation of Vin;need microscope,vaginal vault excision of keloid;will want kenolog 40 from pharmacy.  Marland Kitchen LAPAROTOMY  05/02/2012   Procedure: EXPLORATORY LAPAROTOMY;  Surgeon: Margarette Asal, MD;  Location: [facility];  Service: Gynecology;  Laterality: N/A;  . RHINOPLASTY     nose  . SALPINGOOPHORECTOMY  05/02/2012   Procedure: SALPINGO OOPHERECTOMY;  Surgeon: Margarette Asal, MD;  Location: [facility];  Service: Gynecology;  Laterality: Bilateral;  bilateral  . SCAR REVISION  03/23/2012   Procedure: SCAR REVISION;  Surgeon: Margarette Asal, MD;  Location: [facility];  Service: Gynecology;  Laterality: N/A;  . SCAR REVISION N/A 08/08/2013   Procedure: SCAR REVISION ABDOMINAL;  Surgeon: Margarette Asal, MD;  Location: [facility];  Service: Gynecology;  Laterality: N/A;   . TUBAL LIGATION    . WISDOM TOOTH EXTRACTION      Social History   Socioeconomic History  . Marital status: Married    Spouse name: Not on file  . Number of children: Not on file  . Years of education: Not on file  . Highest education level: Not on file  Occupational History  . Not on file  Social Needs  . Financial resource strain: Not on file  . Food insecurity:    Worry: Not on file    Inability: Not on file  . Transportation needs:    Medical: Not on file    Non-medical: Not on file  Tobacco Use  . Smoking status: Never Smoker  . Smokeless tobacco: Never Used  Substance and Sexual Activity  . Alcohol use: No  . Drug use: No  . Sexual activity: Yes    Birth control/protection: Surgical  Lifestyle  . Physical activity:    Days per week: Not on file    Minutes per session: Not on file  . Stress: Not on file  Relationships  . Social connections:    Talks on phone: Not on file    Gets together: Not on file    Attends religious service: Not on file    Active member of club or organization: Not on file    Attends meetings of clubs or organizations: Not on file    Relationship status: Not on file  . Intimate  partner violence:    Fear of current or ex partner: Not on file    Emotionally abused: Not on file    Physically abused: Not on file    Forced sexual activity: Not on file  Other Topics Concern  . Not on file  Social History Narrative  . Not on file    Current Outpatient Medications on File Prior to Visit  Medication Sig Dispense Refill  . cyclobenzaprine (FLEXERIL) 5 MG tablet Take 1 tablet (5 mg total) by mouth 3 (three) times daily as needed for muscle spasms. 60 tablet 1  . escitalopram (LEXAPRO) 10 MG tablet Take 1 tablet (10 mg total) by mouth daily. 90 tablet 3  . hydrOXYzine (ATARAX/VISTARIL) 10 MG tablet Take 1 tablet (10 mg total) by mouth 3 (three) times daily as needed. 40 tablet 1  . temazepam (RESTORIL) 30 MG capsule Take 1 capsule (30 mg  total) by mouth at bedtime as needed for sleep. 30 capsule 2  . triamcinolone (KENALOG) 0.1 % paste Use as directed 1 application in the mouth or throat 2 (two) times daily. 5 g 12   No current facility-administered medications on file prior to visit.     Allergies  Allergen Reactions  . Latex Shortness Of Breath, Swelling and Rash  . Methimazole Hives and Itching  . Other Itching and Nausea And Vomiting    Pain medication given after surgery; pt doesn't remember name.  Most likely opioid-derived.    Family History  Problem Relation Age of Onset  . Asthma Mother   . Thyroid disease Neg Hx     BP 120/72 (BP Location: Left Arm, Patient Position: Sitting, Cuff Size: Normal)   Pulse 86   Ht 5\' 7"  (1.702 m)   Wt 148 lb 6.4 oz (67.3 kg)   SpO2 96%   BMI 23.24 kg/m   Review of Systems Denies fever and sob, but she has heartburn.     Objective:   Physical Exam VITAL SIGNS:  See vs page GENERAL: no distress NECK: thyroid is slightly enlarged, with irreg surface.  No palpable nodule.  Skin: no rash is seen now     Assessment & Plan:  Hyperthyroidism: not improved.  I have sent a prescription to your pharmacy, to change to PTU. Rash, new to me, poss due to tapazole Heartburn: poss due to hyperthyroidism  Patient Instructions  Blood tests are requested for you today.  We'll let you know about the results.  Based on the results, I'll prescribe for you a different thyroid pill.  If ever you have fever while taking this, stop it and call us, even if the reason is obvious, because of the risk of a rare side-effect.  "Omeprazole" (non-prescription) will help the neck symptoms.   Please come back for a follow-up appointment in 1 month.    ???????????????????????????????????????????? ??????????????????????????? ??????????????????????????????????????????????????????????????????  ???????????????????????????????????????????????????????????????????????????????????????????????????????????????? ????? "????????????" (????????????????) ???????????????? ??????????????????????????????????????????? ? ???

## 2018-10-31 NOTE — Patient Instructions (Addendum)
Blood tests are requested for you today.  We'll let you know about the results.  Based on the results, I'll prescribe for you a different thyroid pill.  If ever you have fever while taking this, stop it and call us, even if the reason is obvious, because of the risk of a rare side-effect.  "Omeprazole" (non-prescription) will help the neck symptoms.   Please come back for a follow-up appointment in 1 month.    ???????????????????????????????????????????? ??????????????????????????? ?????????????????????????????????????????????????????????????????? ???????????????????????????????????????????????????????????????????????????????????????????????????????????????? ????? "????????????" (????????????????) ???????????????? ??????????????????????????????????????????? ? ???

## 2018-11-11 ENCOUNTER — Ambulatory Visit: Payer: Managed Care, Other (non HMO) | Admitting: Endocrinology

## 2018-11-21 ENCOUNTER — Ambulatory Visit (INDEPENDENT_AMBULATORY_CARE_PROVIDER_SITE_OTHER): Payer: Managed Care, Other (non HMO) | Admitting: Endocrinology

## 2018-11-21 ENCOUNTER — Telehealth: Payer: Self-pay

## 2018-11-21 DIAGNOSIS — E0590 Thyrotoxicosis, unspecified without thyrotoxic crisis or storm: Secondary | ICD-10-CM

## 2018-11-21 DIAGNOSIS — L299 Pruritus, unspecified: Secondary | ICD-10-CM

## 2018-11-21 MED ORDER — TRIAMCINOLONE ACETONIDE 0.1 % EX CREA: 1.0000 "application " | TOPICAL_CREAM | Freq: Two times a day (BID) | CUTANEOUS | 0 refills | Status: DC

## 2018-11-21 NOTE — Telephone Encounter (Signed)
patient called today and stated MD started her on proylphiouracil on 10/31/18 and she is experiencing itching and rash with swelling patient wants to know if there is something else that she can take that- she is also requesting something that is cheaper as well

## 2018-11-21 NOTE — Telephone Encounter (Signed)
Only other options are RAI and surgery.  However, because of coronavirus, these are not available.  Do you want webex visit today after 2 PM, to address further?

## 2018-11-21 NOTE — Telephone Encounter (Signed)
Please advise 

## 2018-11-21 NOTE — Telephone Encounter (Signed)
Called pt to offer WebEx Visit today with Dr. Loanne Drilling at 2pm to further discuss her concerns.

## 2018-11-22 NOTE — Progress Notes (Addendum)
   Subjective:    Patient ID: [name], female    DOB: [birth year], 48 y.o.   MRN: [id number]  HPI telehealth visit today via telephone visit, x 10 minutes Alternatives to telehealth are presented to this patient, and the patient agrees to the telehealth visit. Pt is advised of the cost of the visit, and agrees to this, also.   Patient is at home, and I am at the office.  Pt states ongoing generalized itching, but no as bad as when she was on.  No fever. We discussed limited options (surg and RAI are not available now.)     Review of Systems     Objective:   Physical Exam      Assessment & Plan:    I have sent a prescription to your pharmacy, for a cream to stop the itching.  please call in 2 days, to tell us how the symptoms are doing.

## 2018-11-28 ENCOUNTER — Encounter: Payer: Self-pay | Admitting: Gastroenterology

## 2018-12-01 ENCOUNTER — Encounter: Payer: Self-pay | Admitting: Endocrinology

## 2018-12-02 ENCOUNTER — Ambulatory Visit (INDEPENDENT_AMBULATORY_CARE_PROVIDER_SITE_OTHER): Payer: Managed Care, Other (non HMO) | Admitting: Endocrinology

## 2018-12-02 ENCOUNTER — Other Ambulatory Visit: Payer: Self-pay

## 2018-12-02 ENCOUNTER — Ambulatory Visit: Payer: Managed Care, Other (non HMO) | Admitting: Endocrinology

## 2018-12-02 DIAGNOSIS — L299 Pruritus, unspecified: Secondary | ICD-10-CM

## 2018-12-02 DIAGNOSIS — E0590 Thyrotoxicosis, unspecified without thyrotoxic crisis or storm: Secondary | ICD-10-CM

## 2018-12-02 MED ORDER — HYDROXYZINE HCL 10 MG PO TABS: 10.0000 mg | ORAL_TABLET | Freq: Three times a day (TID) | ORAL | 3 refills | Status: DC | PRN

## 2018-12-02 MED ORDER — TRIAMCINOLONE ACETONIDE 0.1 % EX CREA: 1.0000 "application " | TOPICAL_CREAM | Freq: Two times a day (BID) | CUTANEOUS | 3 refills | Status: AC

## 2018-12-02 MED ORDER — METHIMAZOLE 10 MG PO TABS: 10.0000 mg | ORAL_TABLET | Freq: Two times a day (BID) | ORAL | 3 refills | Status: DC

## 2018-12-02 NOTE — Patient Instructions (Signed)
I have sent a prescription to your pharmacy, to change the PTU to methimazole.  Also, I have sent a prescription to your pharmacy, to refill the 2 meds for itching.

## 2018-12-02 NOTE — Progress Notes (Addendum)
   Subjective:    Patient ID: [name], female    DOB: [birth year], 48 y.o.   MRN: [id number]  HPI  telehealth visit today via Telephone visit.  Total time=10 minutes Alternatives to telehealth are presented to this patient, and the patient agrees to the telehealth visit. Pt is advised of the cost of the visit, and agrees to this, also.   Patient is at home, and I am at the office.   Pt returns for f/u of hyperthyroidism (dx'ed early 2020, on routine labs (it was normal in early 2019); she has never had thyroid imaging; she declines RAI, due to cost; she has reported itching, possibly related to tapazole and PTU).  Pt says itching is improved, but not resolved.  She feels the itching is prob not related to thyroid rx.  She wants to go back to tapazole (cheaper).    Review of Systems Denies fever    Objective:   Physical Exam      Assessment & Plan:  Hyperthyroidism: she needs rx Itching: I agree that this may not be related to thionamide rx   Patient Instructions  I have sent a prescription to your pharmacy, to change the PTU to methimazole.  Also, I have sent a prescription to your pharmacy, to refill the 2 meds for itching.

## 2018-12-20 ENCOUNTER — Encounter: Payer: Self-pay | Admitting: Gastroenterology

## 2018-12-20 ENCOUNTER — Ambulatory Visit (AMBULATORY_SURGERY_CENTER): Payer: Self-pay

## 2018-12-20 ENCOUNTER — Other Ambulatory Visit: Payer: Self-pay

## 2018-12-20 VITALS — Ht 67.0 in | Wt 148.0 lb

## 2018-12-20 DIAGNOSIS — Z1211 Encounter for screening for malignant neoplasm of colon: Secondary | ICD-10-CM

## 2018-12-20 MED ORDER — NA SULFATE-K SULFATE-MG SULF 17.5-3.13-1.6 GM/177ML PO SOLN: 1.0000 | Freq: Once | ORAL | 0 refills | Status: AC

## 2018-12-20 NOTE — Progress Notes (Signed)
Denies allergies to eggs or soy products. Denies complication of anesthesia or sedation. Denies use of weight loss medication. Denies use of O2.   Emmi instructions given for colonoscopy.   Pre-Visit was conducted by phone due to Covid 19. Instructions were reviewed with patient and information was mailed to confirmed home address. A 15.00 coupon for Suprep was given to the patient. Patient was encouraged to call with any questions or concerns.

## 2019-01-04 ENCOUNTER — Telehealth: Payer: Self-pay | Admitting: *Deleted

## 2019-01-04 NOTE — Telephone Encounter (Signed)
Covid-19 travel screening questions  Have you traveled in the last 14 days?no If yes where?  Do you now or have you had a fever in the last 14 days?no  Do you have any respiratory symptoms of shortness of breath or cough now or in the last 14 days?no  Do you have a medical history of Congestive Heart Failure?  Do you have a medical history of lung disease?  Do you have any family members or close contacts with diagnosed or suspected Covid-19?no   Pt made aware of care partner policy and will bring a mask with her. SM

## 2019-01-06 ENCOUNTER — Ambulatory Visit (AMBULATORY_SURGERY_CENTER): Payer: Managed Care, Other (non HMO) | Admitting: Gastroenterology

## 2019-01-06 ENCOUNTER — Encounter: Payer: Self-pay | Admitting: Gastroenterology

## 2019-01-06 ENCOUNTER — Other Ambulatory Visit: Payer: Self-pay

## 2019-01-06 VITALS — BP 121/78 | HR 61 | Temp 98.5°F | Resp 18 | Ht 67.0 in | Wt 148.0 lb

## 2019-01-06 DIAGNOSIS — D123 Benign neoplasm of transverse colon: Secondary | ICD-10-CM

## 2019-01-06 DIAGNOSIS — Z1211 Encounter for screening for malignant neoplasm of colon: Secondary | ICD-10-CM

## 2019-01-06 DIAGNOSIS — D127 Benign neoplasm of rectosigmoid junction: Secondary | ICD-10-CM

## 2019-01-06 MED ORDER — SODIUM CHLORIDE 0.9 % IV SOLN: 500.0000 mL | Freq: Once | INTRAVENOUS | Status: DC

## 2019-01-06 NOTE — Op Note (Signed)
[facility] Patient Name: [name] Procedure Date: 01/06/2019 9:02 AM MRN: [id number] Endoscopist: Remo Lipps P. Havery Moros , MD Age: 48 Referring MD:  Date of Birth: March 20, 1971 Gender: Female Account #: [id number] Procedure:                Colonoscopy Indications:              Screening for colorectal malignant neoplasm, This                            is the patient's first colonoscopy Medicines:                Monitored Anesthesia Care Procedure:                Pre-Anesthesia Assessment:                           - Prior to the procedure, a History and Physical                            was performed, and patient medications and                            allergies were reviewed. The patient's tolerance of                            previous anesthesia was also reviewed. The risks                            and benefits of the procedure and the sedation                            options and risks were discussed with the patient.                            All questions were answered, and informed consent                            was obtained. Prior Anticoagulants: The patient has                            taken no previous anticoagulant or antiplatelet                            agents. ASA Grade Assessment: II - A patient with                            mild systemic disease. After reviewing the risks                            and benefits, the patient was deemed in                            satisfactory condition to undergo the procedure.  After obtaining informed consent, the colonoscope                            was passed under direct vision. Throughout the                            procedure, the patient's blood pressure, pulse, and                            oxygen saturations were monitored continuously. The                            Colonoscope was introduced through the anus and                            advanced to the the cecum,  identified by                            appendiceal orifice and ileocecal valve. The                            colonoscopy was performed without difficulty. The                            patient tolerated the procedure well. The quality                            of the bowel preparation was good. The ileocecal                            valve, appendiceal orifice, and rectum were                            photographed. Scope In: 2:59:56 AM Scope Out: 9:39:11 AM Scope Withdrawal Time: 0 hours 28 minutes 0 seconds  Total Procedure Duration: 0 hours 32 minutes 53 seconds  Findings:                 The perianal and digital rectal examinations were                            normal.                           Two sessile polyps were found in the hepatic                            flexure. The polyps were 3 to 4 mm in size. These                            polyps were removed with a cold snare. Resection                            and retrieval were complete.  A 5 mm polyp was found in the transverse colon. The                            polyp was sessile. The polyp was removed with a                            cold snare, although technically this took several                            minutes to obtain good positioning due to looping                            in this area. Resection and retrieval were complete.                           Two sessile polyps were found in the recto-sigmoid                            colon. The polyps were 3 to 4 mm in size. These                            polyps were removed with a cold snare. Resection                            and retrieval were complete.                           An area of what appeared to be healed previously                            inflamed mucosa was found in the recto-sigmoid                            colon, with suspected possible pseudoinflammatory                            polyps. No active  inflammation noted. Biopsies were                            taken with a cold forceps for histology.                           A few medium-mouthed diverticula were found in the                            ascending colon and cecum.                           The exam was otherwise without abnormality. Complications:            No immediate complications. Estimated blood loss:  Minimal. Estimated Blood Loss:     Estimated blood loss was minimal. Impression:               - Two 3 to 4 mm polyps at the hepatic flexure,                            removed with a cold snare. Resected and retrieved.                           - One 5 mm polyp in the transverse colon, removed                            with a cold snare. Resected and retrieved.                           - Two 3 to 4 mm polyps at the recto-sigmoid colon,                            removed with a cold snare. Resected and retrieved.                           - Changes in the recto-sigmoid colon that appeared                            to be healed, previously inflamed mucosa. Biopsied.                           - The examination was otherwise normal. Recommendation:           - Patient has a contact number available for                            emergencies. The signs and symptoms of potential                            delayed complications were discussed with the                            patient. Return to normal activities tomorrow.                            Written discharge instructions were provided to the                            patient.                           - Resume previous diet.                           - Continue present medications.                           - Await pathology results. Remo Lipps P. Armbruster, MD 01/06/2019 9:52:49 AM This report has been signed electronically.

## 2019-01-06 NOTE — Progress Notes (Signed)
Called to room to assist during endoscopic procedure.  Patient ID and intended procedure confirmed with present staff. Received instructions for my participation in the procedure from the performing physician.  

## 2019-01-06 NOTE — Progress Notes (Signed)
Report to PACU, RN, vss, BBS= Clear.  

## 2019-01-06 NOTE — Patient Instructions (Signed)
  Thank you for allowing [place] to care for you today!  Await pathology results by mail, approximately 2 weeks.  Will recommend next colonoscopy  date at that time.  Resume previous diet and medications today.  Return to normal activities tomorrow     YOU HAD AN ENDOSCOPIC PROCEDURE TODAY AT [facility]:   Refer to the procedure report that was given to you for any specific questions about what was found during the examination.  If the procedure report does not answer your questions, please call your gastroenterologist to clarify.  If you requested that your care partner not be given the details of your procedure findings, then the procedure report has been included in a sealed envelope for you to review at your convenience later.  YOU SHOULD EXPECT: Some feelings of bloating in the abdomen. Passage of more gas than usual.  Walking can help get rid of the air that was put into your GI tract during the procedure and reduce the bloating. If you had a lower endoscopy (such as a colonoscopy or flexible sigmoidoscopy) you may notice spotting of blood in your stool or on the toilet paper. If you underwent a bowel prep for your procedure, you may not have a normal bowel movement for a few days.  Please Note:  You might notice some irritation and congestion in your nose or some drainage.  This is from the oxygen used during your procedure.  There is no need for concern and it should clear up in a day or so.  SYMPTOMS TO REPORT IMMEDIATELY:   Following lower endoscopy (colonoscopy or flexible sigmoidoscopy):  Excessive amounts of blood in the stool  Significant tenderness or worsening of abdominal pains  Swelling of the abdomen that is new, acute  Fever of 100F or higher    For urgent or emergent issues, a gastroenterologist can be reached at any hour by calling [phone].   DIET:  We do recommend a small meal at first, but then you may proceed to your regular diet.  Drink  plenty of fluids but you should avoid alcoholic beverages for 24 hours.  ACTIVITY:  You should plan to take it easy for the rest of today and you should NOT DRIVE or use heavy machinery until tomorrow (because of the sedation medicines used during the test).    FOLLOW UP: Our staff will call the number listed on your records 48-72 hours following your procedure to check on you and address any questions or concerns that you may have regarding the information given to you following your procedure. If we do not reach you, we will leave a message.  We will attempt to reach you two times.  During this call, we will ask if you have developed any symptoms of COVID 19. If you develop any symptoms (for example fever, flu-like symptoms, shortness of breath, cough etc.) before then, please call [phone].  If any biopsies were taken you will be contacted by phone or by letter within the next 1-3 weeks.  Please call us at [phone] if you have not heard about the biopsies in 3 weeks.    SIGNATURES/CONFIDENTIALITY: You and/or your care partner have signed paperwork which will be entered into your electronic medical record.  These signatures attest to the fact that that the information above on your After Visit Summary has been reviewed and is understood.  Full responsibility of the confidentiality of this discharge information lies with you and/or your care-partner.

## 2019-01-06 NOTE — Progress Notes (Signed)
Courtney Washington, CMA - temp Judy Branson, CMA - VS 

## 2019-01-10 ENCOUNTER — Telehealth: Payer: Self-pay

## 2019-01-10 NOTE — Telephone Encounter (Signed)
  Follow up Call-  Call back number 01/06/2019  Post procedure Call Back phone  # [phone] cell  Permission to leave phone message Yes  Some recent data might be hidden     Patient questions:  Do you have a fever, pain , or abdominal swelling? No. Pain Score  0 *  Have you tolerated food without any problems? Yes.    Have you been able to return to your normal activities? Yes.    Do you have any questions about your discharge instructions: Diet   No. Medications  No. Follow up visit  No.  Do you have questions or concerns about your Care? No.  Actions: * If pain score is 4 or above: No action needed, pain <4.  1. Have you developed a fever since your procedure? no  2.   Have you had an respiratory symptoms (SOB or cough) since your procedure? no  3.   Have you tested positive for COVID 19 since your procedure no  4.   Have you had any family members/close contacts diagnosed with the COVID 19 since your procedure?  no   If any of these questions are a yes, please inquire if patient has been seen by family doctor and route this note to Joylene John, Therapist, sports.

## 2019-01-11 ENCOUNTER — Telehealth: Payer: Self-pay

## 2019-01-11 NOTE — Telephone Encounter (Signed)
LOV 12/02/18. Called to schedule f/u appt. LVM requesting returned call.

## 2019-01-12 ENCOUNTER — Encounter: Payer: Self-pay | Admitting: Gastroenterology

## 2019-01-19 ENCOUNTER — Other Ambulatory Visit: Payer: Self-pay

## 2019-01-19 ENCOUNTER — Ambulatory Visit (INDEPENDENT_AMBULATORY_CARE_PROVIDER_SITE_OTHER): Payer: Managed Care, Other (non HMO) | Admitting: Endocrinology

## 2019-01-19 DIAGNOSIS — E0590 Thyrotoxicosis, unspecified without thyrotoxic crisis or storm: Secondary | ICD-10-CM

## 2019-01-19 DIAGNOSIS — L299 Pruritus, unspecified: Secondary | ICD-10-CM

## 2019-01-19 NOTE — Progress Notes (Signed)
Subjective:    Patient ID: [name], female    DOB: 05-05-1971, 48 y.o.   MRN: [id number]  HPI telehealth visit today via doxy video visit.  Alternatives to telehealth are presented to this patient, and the patient agrees to the telehealth visit. Pt is advised of the cost of the visit, and agrees to this, also.   Patient is at home, and I am at the office.   Persons attending the telehealth visit: the patient and I Pt returns for f/u of hyperthyroidism (dx'ed early 2020, on routine labs (it was normal in early 2019); she has never had thyroid imaging; she declines RAI, due to cost; she has reported itching, possibly related to medication).   Pt says itching persists.  She has slight tremor of the hands, and assoc palpitations.  Past Medical History:  Diagnosis Date  . Abdominal pain, left lower quadrant 03/26/2008  . ABDOMINAL PAIN, SUPRAPUBIC 08/04/2007  . ALLERGIC RHINITIS 08/04/2007  . Allergy   . Anemia   . ANXIETY 08/04/2007  . Cough    instructed to see primary care if worsens-no fever  . DEPRESSION 08/04/2007  . FATIGUE 08/04/2007  . GERD 08/04/2007  . Headache(784.0)   . Hyperlipidemia 05/20/2014  . Insomnia   . SINUSITIS- ACUTE-NOS 07/25/2010  . SVD (spontaneous vaginal delivery)    x 4  . Thyroid disease     Past Surgical History:  Procedure Laterality Date  . ABDOMINAL HYSTERECTOMY  2004  . LAPAROSCOPY  03/23/2012   Procedure: LAPAROSCOPY OPERATIVE;  Surgeon: Margarette Asal, MD;  Location: [facility];  Service: Gynecology;  Laterality: N/A;  with CO2 laser ablation of Vin;need microscope,vaginal vault excision of keloid;will want kenolog 40 from pharmacy.  Marland Kitchen LAPAROTOMY  05/02/2012   Procedure: EXPLORATORY LAPAROTOMY;  Surgeon: Margarette Asal, MD;  Location: [facility];  Service: Gynecology;  Laterality: N/A;  . RHINOPLASTY     nose  . SALPINGOOPHORECTOMY  05/02/2012   Procedure: SALPINGO OOPHERECTOMY;  Surgeon: Margarette Asal, MD;  Location: [facility];  Service:  Gynecology;  Laterality: Bilateral;  bilateral  . SCAR REVISION  03/23/2012   Procedure: SCAR REVISION;  Surgeon: Margarette Asal, MD;  Location: [facility];  Service: Gynecology;  Laterality: N/A;  . SCAR REVISION N/A 08/08/2013   Procedure: SCAR REVISION ABDOMINAL;  Surgeon: Margarette Asal, MD;  Location: [facility];  Service: Gynecology;  Laterality: N/A;  . TUBAL LIGATION    . WISDOM TOOTH EXTRACTION      Social History   Socioeconomic History  . Marital status: Married    Spouse name: Not on file  . Number of children: Not on file  . Years of education: Not on file  . Highest education level: Not on file  Occupational History  . Not on file  Social Needs  . Financial resource strain: Not on file  . Food insecurity:    Worry: Not on file    Inability: Not on file  . Transportation needs:    Medical: Not on file    Non-medical: Not on file  Tobacco Use  . Smoking status: Never Smoker  . Smokeless tobacco: Never Used  Substance and Sexual Activity  . Alcohol use: No  . Drug use: No  . Sexual activity: Yes    Birth control/protection: Surgical  Lifestyle  . Physical activity:    Days per week: Not on file    Minutes per session: Not on file  . Stress: Not on file  Relationships  . Social connections:    Talks on phone: Not on file    Gets together: Not on file    Attends religious service: Not on file    Active member of club or organization: Not on file    Attends meetings of clubs or organizations: Not on file    Relationship status: Not on file  . Intimate partner violence:    Fear of current or ex partner: Not on file    Emotionally abused: Not on file    Physically abused: Not on file    Forced sexual activity: Not on file  Other Topics Concern  . Not on file  Social History Narrative  . Not on file    Current Outpatient Medications on File Prior to Visit  Medication Sig Dispense Refill  . hydrOXYzine (ATARAX/VISTARIL) 10 MG tablet Take 1 tablet (10 mg  total) by mouth 3 (three) times daily as needed. 100 tablet 3  . Loratadine (CLARITIN) 10 MG CAPS Take by mouth.    . methimazole (TAPAZOLE) 10 MG tablet Take 1 tablet (10 mg total) by mouth 2 (two) times daily. 60 tablet 3  . triamcinolone cream (KENALOG) 0.1 % Apply 1 application topically 2 (two) times daily. 160 g 3  . escitalopram (LEXAPRO) 10 MG tablet Take 1 tablet (10 mg total) by mouth daily. 90 tablet 3   No current facility-administered medications on file prior to visit.     Allergies  Allergen Reactions  . Latex Shortness Of Breath, Swelling and Rash  . Methimazole Hives and Itching  . Other Itching and Nausea And Vomiting    Pain medication given after surgery; pt doesn't remember name.  Most likely opioid-derived.  . Soy Allergy Swelling    Family History  Problem Relation Age of Onset  . Asthma Mother   . Thyroid disease Neg Hx   . Colon cancer Neg Hx   . Esophageal cancer Neg Hx   . Rectal cancer Neg Hx   . Stomach cancer Neg Hx      Review of Systems Denies fever.  She has anxiety.      Objective:   Physical Exam      Assessment & Plan:  Hyperthyroidism: due for recheck. I ordered Itching, worse: uncertain relationship to the above.  I rx'ed TAC cream

## 2019-01-23 ENCOUNTER — Other Ambulatory Visit: Payer: Self-pay

## 2019-01-23 ENCOUNTER — Other Ambulatory Visit (INDEPENDENT_AMBULATORY_CARE_PROVIDER_SITE_OTHER): Payer: Managed Care, Other (non HMO)

## 2019-01-23 DIAGNOSIS — E0590 Thyrotoxicosis, unspecified without thyrotoxic crisis or storm: Secondary | ICD-10-CM

## 2019-01-23 LAB — TSH: TSH: 0.95 u[IU]/mL (ref 0.35–4.50)

## 2019-01-23 LAB — T4, FREE: Free T4: 0.58 ng/dL — ABNORMAL LOW (ref 0.60–1.60)

## 2019-02-10 ENCOUNTER — Telehealth: Payer: Self-pay | Admitting: Endocrinology

## 2019-02-10 ENCOUNTER — Other Ambulatory Visit: Payer: Self-pay

## 2019-02-10 DIAGNOSIS — L299 Pruritus, unspecified: Secondary | ICD-10-CM

## 2019-02-10 MED ORDER — HYDROXYZINE HCL 10 MG PO TABS: 10.0000 mg | ORAL_TABLET | Freq: Three times a day (TID) | ORAL | 2 refills | Status: DC | PRN

## 2019-02-10 NOTE — Telephone Encounter (Signed)
Please refill x 3 months  

## 2019-02-10 NOTE — Telephone Encounter (Signed)
hydrOXYzine (ATARAX/VISTARIL) 10 MG tablet 90 tablet 2 02/10/2019    Sig - Route: Take 1 tablet (10 mg total) by mouth 3 (three) times daily as needed. - Oral   Sent to pharmacy as: hydrOXYzine (ATARAX/VISTARIL) 10 MG tablet   E-Prescribing Status: Receipt confirmed by pharmacy (02/10/2019 11:08 AM EDT)

## 2019-02-10 NOTE — Telephone Encounter (Signed)
MEDICATION: hydrOXYzine (ATARAX/VISTARIL) 10 MG tablet  PHARMACY:   [facility] [place], [address] [phone] (Phone) [phone] (Fax)    IS THIS A 90 DAY SUPPLY : Yes  IS PATIENT OUT OF MEDICATION: Yes  IF NOT; HOW MUCH IS LEFT: 0  LAST APPOINTMENT DATE: @6 /11/2018  NEXT APPOINTMENT DATE:@7 /22/2020  DO WE HAVE YOUR PERMISSION TO LEAVE A DETAILED MESSAGE: Yes  OTHER COMMENTS: Patient requests to be notified  when RX has been sent to [facility]   **Let patient know to contact pharmacy at the end of the day to make sure medication is ready. **  ** Please notify patient to allow 48-72 hours to process**  **Encourage patient to contact the pharmacy for refills or they can request refills through [facility]**

## 2019-02-10 NOTE — Telephone Encounter (Signed)
Please advise if this is something PCP should manage or you would like to refill

## 2019-03-06 ENCOUNTER — Other Ambulatory Visit: Payer: Self-pay

## 2019-03-08 ENCOUNTER — Encounter: Payer: Self-pay | Admitting: Endocrinology

## 2019-03-08 ENCOUNTER — Ambulatory Visit (INDEPENDENT_AMBULATORY_CARE_PROVIDER_SITE_OTHER): Payer: Managed Care, Other (non HMO) | Admitting: Endocrinology

## 2019-03-08 ENCOUNTER — Other Ambulatory Visit: Payer: Self-pay

## 2019-03-08 VITALS — BP 112/80 | HR 69 | Ht 67.0 in | Wt 168.4 lb

## 2019-03-08 DIAGNOSIS — E0590 Thyrotoxicosis, unspecified without thyrotoxic crisis or storm: Secondary | ICD-10-CM

## 2019-03-08 LAB — T4, FREE: Free T4: 0.57 ng/dL — ABNORMAL LOW (ref 0.60–1.60)

## 2019-03-08 LAB — TSH: TSH: 2.21 u[IU]/mL (ref 0.35–4.50)

## 2019-03-08 MED ORDER — METHIMAZOLE 10 MG PO TABS: 10.0000 mg | ORAL_TABLET | Freq: Every day | ORAL | 3 refills | Status: DC

## 2019-03-08 MED ORDER — HYDROXYZINE HCL 25 MG PO TABS: 25.0000 mg | ORAL_TABLET | Freq: Three times a day (TID) | ORAL | 3 refills | Status: DC | PRN

## 2019-03-08 NOTE — Progress Notes (Signed)
Subjective:    Patient ID: [name], female    DOB: 1971-03-25, 48 y.o.   MRN: [id number]  HPI Pt returns for f/u of hyperthyroidism (dx'ed early 2020, on routine labs (it was normal in early 2019); she has never had thyroid imaging; she declines RAI, due to cost; she has reported itching, possibly related to medication).  Pt reports moderate itching throughout the body, and assoc palpitations.   Past Medical History:  Diagnosis Date  . Abdominal pain, left lower quadrant 03/26/2008  . ABDOMINAL PAIN, SUPRAPUBIC 08/04/2007  . ALLERGIC RHINITIS 08/04/2007  . Allergy   . Anemia   . ANXIETY 08/04/2007  . Cough    instructed to see primary care if worsens-no fever  . DEPRESSION 08/04/2007  . FATIGUE 08/04/2007  . GERD 08/04/2007  . Headache(784.0)   . Hyperlipidemia 05/20/2014  . Insomnia   . SINUSITIS- ACUTE-NOS 07/25/2010  . SVD (spontaneous vaginal delivery)    x 4  . Thyroid disease     Past Surgical History:  Procedure Laterality Date  . ABDOMINAL HYSTERECTOMY  2004  . LAPAROSCOPY  03/23/2012   Procedure: LAPAROSCOPY OPERATIVE;  Surgeon: Margarette Asal, MD;  Location: [facility];  Service: Gynecology;  Laterality: N/A;  with CO2 laser ablation of Vin;need microscope,vaginal vault excision of keloid;will want kenolog 40 from pharmacy.  Marland Kitchen LAPAROTOMY  05/02/2012   Procedure: EXPLORATORY LAPAROTOMY;  Surgeon: Margarette Asal, MD;  Location: [facility];  Service: Gynecology;  Laterality: N/A;  . RHINOPLASTY     nose  . SALPINGOOPHORECTOMY  05/02/2012   Procedure: SALPINGO OOPHERECTOMY;  Surgeon: Margarette Asal, MD;  Location: [facility];  Service: Gynecology;  Laterality: Bilateral;  bilateral  . SCAR REVISION  03/23/2012   Procedure: SCAR REVISION;  Surgeon: Margarette Asal, MD;  Location: [facility];  Service: Gynecology;  Laterality: N/A;  . SCAR REVISION N/A 08/08/2013   Procedure: SCAR REVISION ABDOMINAL;  Surgeon: Margarette Asal, MD;  Location: [facility];  Service: Gynecology;   Laterality: N/A;  . TUBAL LIGATION    . WISDOM TOOTH EXTRACTION      Social History   Socioeconomic History  . Marital status: Married    Spouse name: Not on file  . Number of children: Not on file  . Years of education: Not on file  . Highest education level: Not on file  Occupational History  . Not on file  Social Needs  . Financial resource strain: Not on file  . Food insecurity    Worry: Not on file    Inability: Not on file  . Transportation needs    Medical: Not on file    Non-medical: Not on file  Tobacco Use  . Smoking status: Never Smoker  . Smokeless tobacco: Never Used  Substance and Sexual Activity  . Alcohol use: No  . Drug use: No  . Sexual activity: Yes    Birth control/protection: Surgical  Lifestyle  . Physical activity    Days per week: Not on file    Minutes per session: Not on file  . Stress: Not on file  Relationships  . Social Herbalist on phone: Not on file    Gets together: Not on file    Attends religious service: Not on file    Active member of club or organization: Not on file    Attends meetings of clubs or organizations: Not on file    Relationship status: Not on file  . Intimate  partner violence    Fear of current or ex partner: Not on file    Emotionally abused: Not on file    Physically abused: Not on file    Forced sexual activity: Not on file  Other Topics Concern  . Not on file  Social History Narrative  . Not on file    Current Outpatient Medications on File Prior to Visit  Medication Sig Dispense Refill  . Loratadine (CLARITIN) 10 MG CAPS Take by mouth.    . triamcinolone cream (KENALOG) 0.1 % Apply 1 application topically 2 (two) times daily. 160 g 3  . escitalopram (LEXAPRO) 10 MG tablet Take 1 tablet (10 mg total) by mouth daily. 90 tablet 3   No current facility-administered medications on file prior to visit.     Allergies  Allergen Reactions  . Latex Shortness Of Breath, Swelling and Rash  .  Methimazole Hives and Itching  . Other Itching and Nausea And Vomiting    Pain medication given after surgery; pt doesn't remember name.  Most likely opioid-derived.  . Soy Allergy Swelling    Family History  Problem Relation Age of Onset  . Asthma Mother   . Thyroid disease Neg Hx   . Colon cancer Neg Hx   . Esophageal cancer Neg Hx   . Rectal cancer Neg Hx   . Stomach cancer Neg Hx     BP 112/80 (BP Location: Left Arm, Patient Position: Sitting, Cuff Size: Normal)   Pulse 69   Ht 5\' 7"  (1.702 m)   Wt 168 lb 6.4 oz (76.4 kg)   SpO2 97%   BMI 26.38 kg/m    Review of Systems Denies fever.  Tremor is now slight.      Objective:   Physical Exam VITAL SIGNS:  See vs page GENERAL: no distress NECK: Thyroid is slightly enlarged, with irreg surface.  No thyroid nodule is palpable.  No palpable lymphadenopathy at the anterior neck.  SKIN: slightly dry, but no rash is seen.        Assessment & Plan:  Hyperthyroidism: we discussed rx options.    Itching, persistent.  Uncertain if due to methimazole.    Patient Instructions  Blood tests are requested for you today.  We'll let you know about the results.  If ever you have fever while taking methimazole, stop it and call us, even if the reason is obvious, because of the risk of a rare side-effect. Please see a surgery specialist.  you will receive a phone call, about a day and time for an appointment.  I have sent a prescription to your pharmacy, to refill the itching medication. Please come back for a follow-up appointment in 3 months, if you have not had the surgery by then.

## 2019-03-08 NOTE — Patient Instructions (Signed)
Blood tests are requested for you today.  We'll let you know about the results.  If ever you have fever while taking methimazole, stop it and call us, even if the reason is obvious, because of the risk of a rare side-effect. Please see a surgery specialist.  you will receive a phone call, about a day and time for an appointment.  I have sent a prescription to your pharmacy, to refill the itching medication. Please come back for a follow-up appointment in 3 months, if you have not had the surgery by then.

## 2019-03-14 ENCOUNTER — Telehealth: Payer: Self-pay | Admitting: Endocrinology

## 2019-03-14 NOTE — Telephone Encounter (Signed)
Returned pt call. Informed of lab results. Advised there are no changes to the current treatment plan, continue all orders as prescribed. Verbalized acceptance and understanding.

## 2019-03-14 NOTE — Telephone Encounter (Signed)
Patient requests to be called at ph# [phone]  Re: lab results -explain the results to patient

## 2019-04-10 ENCOUNTER — Other Ambulatory Visit: Payer: Self-pay | Admitting: Surgery

## 2019-04-10 DIAGNOSIS — E0590 Thyrotoxicosis, unspecified without thyrotoxic crisis or storm: Secondary | ICD-10-CM

## 2019-04-19 ENCOUNTER — Ambulatory Visit
Admission: RE | Admit: 2019-04-19 | Discharge: 2019-04-19 | Disposition: A | Discharge status: Home / Self Care | Payer: Managed Care, Other (non HMO) | Source: Ambulatory Visit | Attending: Surgery | Admitting: Surgery

## 2019-04-19 DIAGNOSIS — E0590 Thyrotoxicosis, unspecified without thyrotoxic crisis or storm: Secondary | ICD-10-CM

## 2019-04-23 ENCOUNTER — Other Ambulatory Visit: Payer: Self-pay | Admitting: Endocrinology

## 2019-05-24 ENCOUNTER — Telehealth: Payer: Self-pay | Admitting: Endocrinology

## 2019-05-24 DIAGNOSIS — E0590 Thyrotoxicosis, unspecified without thyrotoxic crisis or storm: Secondary | ICD-10-CM

## 2019-05-24 NOTE — Telephone Encounter (Signed)
Please advise 

## 2019-05-24 NOTE — Telephone Encounter (Signed)
Patient called stating that she is needing Dr.Ellison to place a referral for her to do the radioactive treatment.  Please Advise, Thanks

## 2019-05-24 NOTE — Telephone Encounter (Signed)
D/c methimazole I have requested the test pill, which we have to do prior to the treatment pill.  you will receive a phone call, about a day and time for an appointment.

## 2019-05-24 NOTE — Telephone Encounter (Signed)
[facility] THYROID SNG UPTAKE W/IMAGING (Order [id number])  Ordered location: [place] pt to make her aware of this order and to provide contact # [phone] for scheduling purposes. Routing this message to Referrals dept to ensure pre-cert/PA is completed IF needed.

## 2019-05-26 NOTE — Telephone Encounter (Signed)
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  She also wants to know if she can go back to work after her study?  Please advise patient at [phone]

## 2019-05-26 NOTE — Telephone Encounter (Signed)
Work note mailed to pt home address.

## 2019-05-26 NOTE — Telephone Encounter (Signed)
Please advise about both concerns

## 2019-05-26 NOTE — Telephone Encounter (Signed)
I printed  

## 2019-05-31 ENCOUNTER — Telehealth: Payer: Self-pay | Admitting: Endocrinology

## 2019-05-31 NOTE — Telephone Encounter (Signed)
Please advise 

## 2019-05-31 NOTE — Telephone Encounter (Signed)
Patient requests to be called at ph# [phone] re: patient would like  an explanation of why the treatments being done on 06/08/19 & 06/09/19 take 2 days to do. Patient also has some questions re: the above appointments (e.g.-how long do the treatments take and questions about after the treatment's are done). Patient is worried.

## 2019-05-31 NOTE — Telephone Encounter (Signed)
Returned pt call. LVM requesting returned call. 

## 2019-05-31 NOTE — Telephone Encounter (Signed)
Day 1: swallow a pill Day 2: lie down in front of a camera  All easy and painless

## 2019-06-01 NOTE — Telephone Encounter (Signed)
SECOND ATTEMPT:  Called pt and informed of RAI testing procedures. Advised if she had additional questions, to call [facility] @ [phone]. Pt states she has not received her letter. Asked if she needed this letter faxed to her employer. Declined. States she wants to come in to the office to pick it up. Letter has been printed and placed in an envelope along with RAI educational material. Envelope has been labeled and placed at the front desk for pt pick up. Pt continuously asked, "after the test and results come back, I will be okay?" Advised I did not want to provide false hope and reassured that we wait for the results to come back so that specific, detailed care plan can be presented to her based on the results received. While pt remained very uncertain, anxious and hesitant, pt voiced understanding of all information provided.

## 2019-06-08 ENCOUNTER — Encounter (HOSPITAL_COMMUNITY)
Admission: RE | Admit: 2019-06-08 | Discharge: 2019-06-08 | Disposition: A | Discharge status: Home / Self Care | Payer: Managed Care, Other (non HMO) | Source: Ambulatory Visit | Attending: Endocrinology | Admitting: Endocrinology

## 2019-06-08 ENCOUNTER — Other Ambulatory Visit: Payer: Self-pay

## 2019-06-08 ENCOUNTER — Ambulatory Visit (HOSPITAL_COMMUNITY): Admission: RE | Admit: 2019-06-08 | Payer: Managed Care, Other (non HMO) | Source: Ambulatory Visit

## 2019-06-08 DIAGNOSIS — E0590 Thyrotoxicosis, unspecified without thyrotoxic crisis or storm: Secondary | ICD-10-CM

## 2019-06-08 MED ORDER — SODIUM IODIDE I-123 7.4 MBQ CAPS
418.0000 | ORAL_CAPSULE | Freq: Once | ORAL | Status: AC
  Administered 2019-06-08: 418 via ORAL

## 2019-06-09 ENCOUNTER — Telehealth: Payer: Self-pay | Admitting: Endocrinology

## 2019-06-09 ENCOUNTER — Encounter (HOSPITAL_COMMUNITY)
Admission: RE | Admit: 2019-06-09 | Discharge: 2019-06-09 | Disposition: A | Discharge status: Home / Self Care | Payer: Managed Care, Other (non HMO) | Source: Ambulatory Visit | Attending: Endocrinology | Admitting: Endocrinology

## 2019-06-09 ENCOUNTER — Other Ambulatory Visit: Payer: Self-pay | Admitting: Endocrinology

## 2019-06-09 ENCOUNTER — Other Ambulatory Visit (HOSPITAL_COMMUNITY): Payer: Managed Care, Other (non HMO)

## 2019-06-09 ENCOUNTER — Ambulatory Visit (HOSPITAL_COMMUNITY): Payer: Managed Care, Other (non HMO)

## 2019-06-09 DIAGNOSIS — E0590 Thyrotoxicosis, unspecified without thyrotoxic crisis or storm: Secondary | ICD-10-CM

## 2019-06-09 NOTE — Telephone Encounter (Signed)
Patient called to advise that she needs to know about taking Methimazole and hospital advised that she needed to call us.  Completed test this am  And needed to know if she should start taking it again.   Patient requesting a call back to see what she should do at this point - [phone]

## 2019-06-12 NOTE — Telephone Encounter (Signed)
Called pt to inform of the following:  1.  Per Dr. Loanne Drilling, RAI has been ordered. Please call [phone]. The Upt[facility] results AND RAI Instructions as well as contact # was placed in our outgoing mail TODAY. 2.  Once RAI has been scheduled, CALL our office to schedule 6-8 week follow (this appt must be 6-8 weeks AFTER RAI appt) 3.  CONTINUE holding Methimazole  LVM requesting returned call.

## 2019-06-12 NOTE — Telephone Encounter (Signed)
SECOND ATTEMPT: ° °LVM requesting returned call. °

## 2019-06-12 NOTE — Telephone Encounter (Signed)
Please advise 

## 2019-06-12 NOTE — Telephone Encounter (Signed)
Please stay off the methimazole for now.

## 2019-06-12 NOTE — Telephone Encounter (Signed)
I do not see an upcoming appt with you. Please advise when you would like her to follow up

## 2019-06-12 NOTE — Telephone Encounter (Signed)
She needs f/u of 6-8 weeks after RAI

## 2019-06-13 NOTE — Telephone Encounter (Signed)
Pt finally returned call. Informed of all instructions below. Verbalized acceptance and understanding.

## 2019-06-13 NOTE — Telephone Encounter (Signed)
FINAL ATTEMPT:  Again LVM requesting returned call.

## 2019-06-23 ENCOUNTER — Ambulatory Visit (HOSPITAL_COMMUNITY)
Admission: RE | Admit: 2019-06-23 | Discharge: 2019-06-23 | Disposition: A | Discharge status: Home / Self Care | Payer: Managed Care, Other (non HMO) | Source: Ambulatory Visit | Attending: Endocrinology | Admitting: Endocrinology

## 2019-06-23 ENCOUNTER — Other Ambulatory Visit: Payer: Self-pay

## 2019-06-23 DIAGNOSIS — E0590 Thyrotoxicosis, unspecified without thyrotoxic crisis or storm: Secondary | ICD-10-CM | POA: Insufficient documentation

## 2019-06-23 MED ORDER — SODIUM IODIDE I 131 CAPSULE: 19.2000 | Freq: Once | INTRAVENOUS | Status: DC

## 2019-06-26 ENCOUNTER — Telehealth: Payer: Self-pay | Admitting: Endocrinology

## 2019-06-26 NOTE — Telephone Encounter (Signed)
Please call the pt back to schedule her for a follow up 6 weeks from the date she finished her test.

## 2019-06-26 NOTE — Telephone Encounter (Signed)
Patient called re: patient is finished with Nuclear Medicine Therapy treatment  (06/23/19) and requests to be called at ph# [phone] to advise next steps. Patient does not know if she needs to see Dr. Loanne Drilling or Dr. Jenny Reichmann (PCP) now.

## 2019-06-27 NOTE — Telephone Encounter (Signed)
LMTCB to schedule appointment °

## 2019-06-27 NOTE — Telephone Encounter (Signed)
Patient is scheduled   

## 2019-08-02 ENCOUNTER — Other Ambulatory Visit: Payer: Self-pay

## 2019-08-02 DIAGNOSIS — E039 Hypothyroidism, unspecified: Secondary | ICD-10-CM | POA: Insufficient documentation

## 2019-08-02 LAB — HM MAMMOGRAPHY: HM Mammogram: NORMAL (ref 0–4)

## 2019-08-04 ENCOUNTER — Encounter: Payer: Self-pay | Admitting: Endocrinology

## 2019-08-04 ENCOUNTER — Other Ambulatory Visit: Payer: Self-pay

## 2019-08-04 ENCOUNTER — Ambulatory Visit (INDEPENDENT_AMBULATORY_CARE_PROVIDER_SITE_OTHER): Payer: Managed Care, Other (non HMO) | Admitting: Endocrinology

## 2019-08-04 VITALS — BP 126/88 | HR 79 | Ht 67.0 in | Wt 181.0 lb

## 2019-08-04 DIAGNOSIS — E0590 Thyrotoxicosis, unspecified without thyrotoxic crisis or storm: Secondary | ICD-10-CM

## 2019-08-04 LAB — T4, FREE: Free T4: 0.83 ng/dL (ref 0.60–1.60)

## 2019-08-04 LAB — TSH: TSH: 1.84 u[IU]/mL (ref 0.35–4.50)

## 2019-08-04 NOTE — Progress Notes (Signed)
Subjective:    Patient ID: [name], female    DOB: 1970-11-02, 48 y.o.   MRN: [id number]  HPI Pt returns for f/u of hyperthyroidism (dx'ed early 2020, on routine labs (it was normal in early 2019); she has never had thyroid imaging; she declines RAI, due to cost; she has reported itching, possibly related to medication).  RAI 11/20.  Pt says neck swelling is less now.  pt states she feels well in general.   Past Medical History:  Diagnosis Date  . Abdominal pain, left lower quadrant 03/26/2008  . ABDOMINAL PAIN, SUPRAPUBIC 08/04/2007  . ALLERGIC RHINITIS 08/04/2007  . Allergy   . Anemia   . ANXIETY 08/04/2007  . Cough    instructed to see primary care if worsens-no fever  . DEPRESSION 08/04/2007  . FATIGUE 08/04/2007  . GERD 08/04/2007  . Headache(784.0)   . Hyperlipidemia 05/20/2014  . Insomnia   . SINUSITIS- ACUTE-NOS 07/25/2010  . SVD (spontaneous vaginal delivery)    x 4  . Thyroid disease     Past Surgical History:  Procedure Laterality Date  . ABDOMINAL HYSTERECTOMY  2004  . LAPAROSCOPY  03/23/2012   Procedure: LAPAROSCOPY OPERATIVE;  Surgeon: Margarette Asal, MD;  Location: [facility] [facility];  Service: Gynecology;  Laterality: N/A;  with CO2 laser ablation of Vin;need microscope,vaginal vault excision of keloid;will want kenolog 40 from pharmacy.  Marland Kitchen LAPAROTOMY  05/02/2012   Procedure: EXPLORATORY LAPAROTOMY;  Surgeon: Margarette Asal, MD;  Location: [facility];  Service: Gynecology;  Laterality: N/A;  . RHINOPLASTY     nose  . SALPINGOOPHORECTOMY  05/02/2012   Procedure: SALPINGO OOPHERECTOMY;  Surgeon: Margarette Asal, MD;  Location: [facility];  Service: Gynecology;  Laterality: Bilateral;  bilateral  . SCAR REVISION  03/23/2012   Procedure: SCAR REVISION;  Surgeon: Margarette Asal, MD;  Location: [facility];  Service: Gynecology;  Laterality: N/A;  . SCAR REVISION N/A 08/08/2013   Procedure: SCAR REVISION ABDOMINAL;  Surgeon: Margarette Asal, MD;  Location: [facility];  Service:  Gynecology;  Laterality: N/A;  . TUBAL LIGATION    . WISDOM TOOTH EXTRACTION      Social History   Socioeconomic History  . Marital status: Divorced    Spouse name: Not on file  . Number of children: Not on file  . Years of education: Not on file  . Highest education level: Not on file  Occupational History  . Not on file  Tobacco Use  . Smoking status: Never Smoker  . Smokeless tobacco: Never Used  Substance and Sexual Activity  . Alcohol use: No  . Drug use: No  . Sexual activity: Yes    Birth control/protection: Surgical  Other Topics Concern  . Not on file  Social History Narrative  . Not on file   Social Determinants of Health   Financial Resource Strain:   . Difficulty of Paying Living Expenses: Not on file  Food Insecurity:   . Worried About Charity fundraiser in the Last Year: Not on file  . Ran Out of Food in the Last Year: Not on file  Transportation Needs:   . Lack of Transportation (Medical): Not on file  . Lack of Transportation (Non-Medical): Not on file  Physical Activity:   . Days of Exercise per Week: Not on file  . Minutes of Exercise per Session: Not on file  Stress:   . Feeling of Stress : Not on file  Social Connections:   .  Frequency of Communication with Friends and Family: Not on file  . Frequency of Social Gatherings with Friends and Family: Not on file  . Attends Religious Services: Not on file  . Active Member of Clubs or Organizations: Not on file  . Attends Archivist Meetings: Not on file  . Marital Status: Not on file  Intimate Partner Violence:   . Fear of Current or Ex-Partner: Not on file  . Emotionally Abused: Not on file  . Physically Abused: Not on file  . Sexually Abused: Not on file    Current Outpatient Medications on File Prior to Visit  Medication Sig Dispense Refill  . hydrOXYzine (ATARAX/VISTARIL) 25 MG tablet Take 1 tablet (25 mg total) by mouth 3 (three) times daily as needed. 100 tablet 3  .  Loratadine (CLARITIN) 10 MG CAPS Take by mouth.    . triamcinolone cream (KENALOG) 0.1 % Apply 1 application topically 2 (two) times daily. 160 g 3  . escitalopram (LEXAPRO) 10 MG tablet Take 1 tablet (10 mg total) by mouth daily. 90 tablet 3   No current facility-administered medications on file prior to visit.    Allergies  Allergen Reactions  . Latex Shortness Of Breath, Swelling and Rash  . Methimazole Hives and Itching  . Other Itching and Nausea And Vomiting    Pain medication given after surgery; pt doesn't remember name.  Most likely opioid-derived.  . Soy Allergy Swelling    Family History  Problem Relation Age of Onset  . Asthma Mother   . Thyroid disease Neg Hx   . Colon cancer Neg Hx   . Esophageal cancer Neg Hx   . Rectal cancer Neg Hx   . Stomach cancer Neg Hx     BP 126/88 (BP Location: Left Arm, Patient Position: Sitting, Cuff Size: Normal)   Pulse 79   Ht 5\' 7"  (1.702 m)   Wt 181 lb (82.1 kg)   SpO2 98%   BMI 28.35 kg/m   Review of Systems Denies tremor and palpitations.  Itching is much less now.      Objective:   Physical Exam VITAL SIGNS:  See vs page GENERAL: no distress NECK: Thyroid is slightly and diffusely enlarged  No thyroid nodule is palpable.  No palpable lymphadenopathy at the anterior neck  Lab Results  Component Value Date   TSH 1.84 08/04/2019       Assessment & Plan:  Hyperthyroidism, better after RAI.  She is at high risk for developing hypothyroidism.  Please come back for a follow-up appointment in 1 month. Itching: uncertain relationship to the above.  Much better.

## 2019-08-04 NOTE — Patient Instructions (Addendum)
Blood tests are requested for you today.  We'll let you know about the results.  Please see Dr Jenny Reichmann in 1 month.  Please come back for a follow-up appointment in 2 months.

## 2019-08-07 ENCOUNTER — Telehealth: Payer: Self-pay

## 2019-08-07 NOTE — Telephone Encounter (Signed)
-----   Message from Renato Shin, MD sent at 08/04/2019  5:24 PM EST ----- please contact patient: Normal--good.  I'll see you next time, because it may go low from here.

## 2019-08-07 NOTE — Telephone Encounter (Signed)
Lab results reviewed by Dr. Ellison. Letter has been mailed. For future reference, letter can be found in Epic. 

## 2019-09-08 ENCOUNTER — Encounter: Payer: Managed Care, Other (non HMO) | Admitting: Internal Medicine

## 2019-09-20 ENCOUNTER — Encounter: Payer: Self-pay | Admitting: Internal Medicine

## 2019-09-20 ENCOUNTER — Ambulatory Visit (INDEPENDENT_AMBULATORY_CARE_PROVIDER_SITE_OTHER): Payer: 59 | Admitting: Internal Medicine

## 2019-09-20 ENCOUNTER — Other Ambulatory Visit: Payer: Self-pay

## 2019-09-20 VITALS — BP 118/78 | HR 66 | Temp 98.3°F | Ht 67.0 in | Wt 184.6 lb

## 2019-09-20 DIAGNOSIS — F32A Depression, unspecified: Secondary | ICD-10-CM

## 2019-09-20 DIAGNOSIS — R739 Hyperglycemia, unspecified: Secondary | ICD-10-CM | POA: Diagnosis not present

## 2019-09-20 DIAGNOSIS — F329 Major depressive disorder, single episode, unspecified: Secondary | ICD-10-CM | POA: Diagnosis not present

## 2019-09-20 DIAGNOSIS — E538 Deficiency of other specified B group vitamins: Secondary | ICD-10-CM | POA: Diagnosis not present

## 2019-09-20 DIAGNOSIS — Z114 Encounter for screening for human immunodeficiency virus [HIV]: Secondary | ICD-10-CM

## 2019-09-20 DIAGNOSIS — E559 Vitamin D deficiency, unspecified: Secondary | ICD-10-CM | POA: Diagnosis not present

## 2019-09-20 DIAGNOSIS — G4700 Insomnia, unspecified: Secondary | ICD-10-CM | POA: Diagnosis not present

## 2019-09-20 DIAGNOSIS — L299 Pruritus, unspecified: Secondary | ICD-10-CM | POA: Diagnosis not present

## 2019-09-20 DIAGNOSIS — Z0001 Encounter for general adult medical examination with abnormal findings: Secondary | ICD-10-CM

## 2019-09-20 DIAGNOSIS — E611 Iron deficiency: Secondary | ICD-10-CM

## 2019-09-20 LAB — CBC WITH DIFFERENTIAL/PLATELET
Basophils Absolute: 0 10*3/uL (ref 0.0–0.1)
Basophils Relative: 0.6 % (ref 0.0–3.0)
Eosinophils Absolute: 0.1 10*3/uL (ref 0.0–0.7)
Eosinophils Relative: 1.6 % (ref 0.0–5.0)
HCT: 39.2 % (ref 36.0–46.0)
Hemoglobin: 12.9 g/dL (ref 12.0–15.0)
Lymphocytes Relative: 32.8 % (ref 12.0–46.0)
Lymphs Abs: 1.8 10*3/uL (ref 0.7–4.0)
MCHC: 32.9 g/dL (ref 30.0–36.0)
MCV: 89.1 fl (ref 78.0–100.0)
Monocytes Absolute: 0.2 10*3/uL (ref 0.1–1.0)
Monocytes Relative: 4.2 % (ref 3.0–12.0)
Neutro Abs: 3.3 10*3/uL (ref 1.4–7.7)
Neutrophils Relative %: 60.8 % (ref 43.0–77.0)
Platelets: 258 10*3/uL (ref 150.0–400.0)
RBC: 4.4 Mil/uL (ref 3.87–5.11)
RDW: 13.5 % (ref 11.5–15.5)
WBC: 5.5 10*3/uL (ref 4.0–10.5)

## 2019-09-20 LAB — HEPATIC FUNCTION PANEL
ALT: 20 U/L (ref 0–35)
AST: 17 U/L (ref 0–37)
Albumin: 4.4 g/dL (ref 3.5–5.2)
Alkaline Phosphatase: 99 U/L (ref 39–117)
Bilirubin, Direct: 0.1 mg/dL (ref 0.0–0.3)
Total Bilirubin: 0.4 mg/dL (ref 0.2–1.2)
Total Protein: 7.6 g/dL (ref 6.0–8.3)

## 2019-09-20 LAB — IBC PANEL
Iron: 86 ug/dL (ref 42–145)
Saturation Ratios: 28.3 % (ref 20.0–50.0)
Transferrin: 217 mg/dL (ref 212.0–360.0)

## 2019-09-20 LAB — BASIC METABOLIC PANEL
BUN: 13 mg/dL (ref 6–23)
CO2: 29 mEq/L (ref 19–32)
Calcium: 9.4 mg/dL (ref 8.4–10.5)
Chloride: 102 mEq/L (ref 96–112)
Creatinine, Ser: 0.53 mg/dL (ref 0.40–1.20)
GFR: 121.73 mL/min (ref >60.00)
Glucose, Bld: 85 mg/dL (ref 70–99)
Potassium: 3.6 mEq/L (ref 3.5–5.1)
Sodium: 138 mEq/L (ref 135–145)

## 2019-09-20 LAB — LIPID PANEL
Cholesterol: 291 mg/dL — ABNORMAL HIGH (ref 0–200)
HDL: 52.2 mg/dL (ref >39.00)
LDL Cholesterol: 223 mg/dL — ABNORMAL HIGH (ref 0–99)
NonHDL: 239.25
Total CHOL/HDL Ratio: 6
Triglycerides: 83 mg/dL (ref 0.0–149.0)
VLDL: 16.6 mg/dL (ref 0.0–40.0)

## 2019-09-20 LAB — URINALYSIS, ROUTINE W REFLEX MICROSCOPIC
Bilirubin Urine: NEGATIVE
Hgb urine dipstick: NEGATIVE
Ketones, ur: NEGATIVE
Leukocytes,Ua: NEGATIVE
Nitrite: NEGATIVE
RBC / HPF: NONE SEEN (ref 0–?)
Specific Gravity, Urine: 1.02 (ref 1.000–1.030)
Total Protein, Urine: NEGATIVE
Urine Glucose: NEGATIVE
Urobilinogen, UA: 0.2 (ref 0.0–1.0)
pH: 6 (ref 5.0–8.0)

## 2019-09-20 LAB — HEMOGLOBIN A1C: Hgb A1c MFr Bld: 6 % (ref 4.6–6.5)

## 2019-09-20 LAB — VITAMIN B12: Vitamin B-12: 941 pg/mL — ABNORMAL HIGH (ref 211–911)

## 2019-09-20 LAB — VITAMIN D 25 HYDROXY (VIT D DEFICIENCY, FRACTURES): VITD: 18.67 ng/mL — ABNORMAL LOW (ref 30.00–100.00)

## 2019-09-20 MED ORDER — TRAZODONE HCL 50 MG PO TABS: 25.0000 mg | ORAL_TABLET | Freq: Every evening | ORAL | 3 refills | Status: DC | PRN

## 2019-09-20 MED ORDER — HYDROXYZINE HCL 25 MG PO TABS: 25.0000 mg | ORAL_TABLET | Freq: Three times a day (TID) | ORAL | 3 refills | Status: AC | PRN

## 2019-09-20 MED ORDER — ESCITALOPRAM OXALATE 10 MG PO TABS: 10.0000 mg | ORAL_TABLET | Freq: Every day | ORAL | 3 refills | Status: DC

## 2019-09-20 NOTE — Assessment & Plan Note (Signed)
Ok for trazodone qhs prn 

## 2019-09-20 NOTE — Progress Notes (Signed)
Subjective:    Patient ID: [name], female    DOB: Jan 11, 1971, 49 y.o.   MRN: [id number]  HPI Here for wellness and f/u;  Overall doing ok;  Pt denies Chest pain, worsening SOB, DOE, wheezing, orthopnea, PND, worsening LE edema, palpitations, dizziness or syncope.  Pt denies neurological change such as new headache, facial or extremity weakness.  Pt denies polydipsia, polyuria, or low sugar symptoms. Pt states overall good compliance with treatment and medications, good tolerability, and has been trying to follow appropriate diet. . No fever, night sweats, wt loss, loss of appetite, or other constitutional symptoms.  Pt states good ability with ADL's, has low fall risk, home safety reviewed and adequate, no other significant changes in hearing or vision, and only occasionally active with exercise.    Wt Readings from Last 3 Encounters:  09/20/19 184 lb 9.6 oz (83.7 kg)  08/04/19 181 lb (82.1 kg)  03/08/19 168 lb 6.4 oz (76.4 kg)  c/o worsening depressive symptoms, but no suicidal ideation, or panic; has ongoing anxiety, and not taking the lexapro.  Has much worsening sleep just unable to get to sleep well most nights and feels tired and terrible the next day Past Medical History:  Diagnosis Date  . Abdominal pain, left lower quadrant 03/26/2008  . ABDOMINAL PAIN, SUPRAPUBIC 08/04/2007  . ALLERGIC RHINITIS 08/04/2007  . Allergy   . Anemia   . ANXIETY 08/04/2007  . Cough    instructed to see primary care if worsens-no fever  . DEPRESSION 08/04/2007  . FATIGUE 08/04/2007  . GERD 08/04/2007  . Headache(784.0)   . Hyperlipidemia 05/20/2014  . Insomnia   . SINUSITIS- ACUTE-NOS 07/25/2010  . SVD (spontaneous vaginal delivery)    x 4  . Thyroid disease    Past Surgical History:  Procedure Laterality Date  . ABDOMINAL HYSTERECTOMY  2004  . LAPAROSCOPY  03/23/2012   Procedure: LAPAROSCOPY OPERATIVE;  Surgeon: Margarette Asal, MD;  Location: [facility];  Service: Gynecology;  Laterality: N/A;   with CO2 laser ablation of Vin;need microscope,vaginal vault excision of keloid;will want kenolog 40 from pharmacy.  Marland Kitchen LAPAROTOMY  05/02/2012   Procedure: EXPLORATORY LAPAROTOMY;  Surgeon: Margarette Asal, MD;  Location: [facility];  Service: Gynecology;  Laterality: N/A;  . RHINOPLASTY     nose  . SALPINGOOPHORECTOMY  05/02/2012   Procedure: SALPINGO OOPHERECTOMY;  Surgeon: Margarette Asal, MD;  Location: [facility];  Service: Gynecology;  Laterality: Bilateral;  bilateral  . SCAR REVISION  03/23/2012   Procedure: SCAR REVISION;  Surgeon: Margarette Asal, MD;  Location: [facility];  Service: Gynecology;  Laterality: N/A;  . SCAR REVISION N/A 08/08/2013   Procedure: SCAR REVISION ABDOMINAL;  Surgeon: Margarette Asal, MD;  Location: [facility];  Service: Gynecology;  Laterality: N/A;  . TUBAL LIGATION    . WISDOM TOOTH EXTRACTION      reports that she has never smoked. She has never used smokeless tobacco. She reports that she does not drink alcohol or use drugs. family history includes Asthma in her mother. Allergies  Allergen Reactions  . Latex Shortness Of Breath, Swelling and Rash  . Methimazole Hives and Itching  . Other Itching and Nausea And Vomiting    Pain medication given after surgery; pt doesn't remember name.  Most likely opioid-derived.  . Soy Allergy Swelling   Current Outpatient Medications on File Prior to Visit  Medication Sig Dispense Refill  . Loratadine (CLARITIN) 10 MG CAPS Take by mouth.    Marland Kitchen  triamcinolone cream (KENALOG) 0.1 % Apply 1 application topically 2 (two) times daily. (Patient not taking: Reported on 09/20/2019) 160 g 3   No current facility-administered medications on file prior to visit.   Review of Systems All otherwise neg per pt     Objective:   Physical Exam BP 118/78 (BP Location: Left Arm, Patient Position: Sitting, Cuff Size: Normal)   Pulse 66   Temp 98.3 F (36.8 C)   Ht 5\' 7"  (1.702 m)   Wt 184 lb 9.6 oz (83.7 kg)   SpO2 99%   BMI 28.91 kg/m    VS noted,  Constitutional: Pt appears in NAD HENT: Head: NCAT.  Right Ear: External ear normal.  Left Ear: External ear normal.  Eyes: . Pupils are equal, round, and reactive to light. Conjunctivae and EOM are normal Nose: without d/c or deformity Neck: Neck supple. Gross normal ROM Cardiovascular: Normal rate and regular rhythm.   Pulmonary/Chest: Effort normal and breath sounds without rales or wheezing.  Abd:  Soft, NT, ND, + BS, no organomegaly Neurological: Pt is alert. At baseline orientation, motor grossly intact Skin: Skin is warm. No rashes, other new lesions, no LE edema Psychiatric: Pt behavior is normal without agitation  All otherwise neg per pt Lab Results  Component Value Date   WBC 5.5 09/20/2019   HGB 12.9 09/20/2019   HCT 39.2 09/20/2019   PLT 258.0 09/20/2019   GLUCOSE 85 09/20/2019   CHOL 291 (H) 09/20/2019   TRIG 83.0 09/20/2019   HDL 52.20 09/20/2019   LDLCALC 223 (H) 09/20/2019   ALT 20 09/20/2019   AST 17 09/20/2019   NA 138 09/20/2019   K 3.6 09/20/2019   CL 102 09/20/2019   CREATININE 0.53 09/20/2019   BUN 13 09/20/2019   CO2 29 09/20/2019   TSH 1.84 08/04/2019   HGBA1C 6.0 09/20/2019       Assessment & Plan:

## 2019-09-20 NOTE — Assessment & Plan Note (Signed)
Or atarax prn,  to f/u any worsening symptoms or concerns

## 2019-09-20 NOTE — Assessment & Plan Note (Signed)

## 2019-09-20 NOTE — Assessment & Plan Note (Signed)
stable overall by history and exam, recent data reviewed with pt, and pt to continue medical treatment as before,  to f/u any worsening symptoms or concerns  

## 2019-09-20 NOTE — Patient Instructions (Addendum)
You are on the [facility] Vaccine Wait List  Please take all new medication as prescribed - the trazodone for sleep as needed  Please continue all other medications as before, and refills have been done if requested, including the restarting of lexapro  Please have the pharmacy call with any other refills you may need.  Please continue your efforts at being more active, low cholesterol diet, and weight control.  You are otherwise up to date with prevention measures today.  Please keep your appointments with your specialists as you may have planned  Please go to the LAB at the blood drawing area for the tests to be done  You will be contacted by phone if any changes need to be made immediately.  Otherwise, you will receive a letter about your results with an explanation, but please check with MyChart first.  Please remember to sign up for MyChart if you have not done so, as this will be important to you in the future with finding out test results, communicating by private email, and scheduling acute appointments online when needed.  Please make an Appointment to return for your 1 year visit, or sooner if needed

## 2019-09-20 NOTE — Assessment & Plan Note (Signed)
Also for pth with labs

## 2019-09-20 NOTE — Assessment & Plan Note (Addendum)
For restart lexapro  I spent 33 minutes preparing to see the patient by review of recent labs, imaging and procedures, obtaining and reviewing separately obtained history, communicating with the patient and family or caregiver, ordering medications, tests or procedures, and documenting clinical information in the EHR including the differential Dx, treatment, and any further evaluation and other management of depression, insomnia, pruritis, hyperglycemia, hypercalcemia,

## 2019-09-21 LAB — PTH, INTACT AND CALCIUM
Calcium: 9.8 mg/dL (ref 8.6–10.4)
PTH: 51 pg/mL (ref 14–64)

## 2019-09-21 LAB — HIV ANTIBODY (ROUTINE TESTING W REFLEX): HIV 1&2 Ab, 4th Generation: NONREACTIVE

## 2019-09-25 ENCOUNTER — Encounter: Payer: Self-pay | Admitting: Internal Medicine

## 2019-09-25 ENCOUNTER — Other Ambulatory Visit: Payer: Self-pay | Admitting: Internal Medicine

## 2019-09-25 MED ORDER — VITAMIN D (ERGOCALCIFEROL) 1.25 MG (50000 UNIT) PO CAPS: 50000.0000 [IU] | ORAL_CAPSULE | ORAL | 0 refills | Status: DC

## 2019-09-25 MED ORDER — ATORVASTATIN CALCIUM 20 MG PO TABS: 20.0000 mg | ORAL_TABLET | Freq: Every day | ORAL | 3 refills | Status: DC

## 2019-10-18 ENCOUNTER — Encounter: Payer: Self-pay | Admitting: Endocrinology

## 2019-10-18 ENCOUNTER — Other Ambulatory Visit: Payer: Self-pay

## 2019-10-18 ENCOUNTER — Telehealth: Payer: Self-pay

## 2019-10-18 ENCOUNTER — Ambulatory Visit (INDEPENDENT_AMBULATORY_CARE_PROVIDER_SITE_OTHER): Payer: 59 | Admitting: Endocrinology

## 2019-10-18 VITALS — BP 110/80 | HR 71 | Ht 67.0 in | Wt 184.8 lb

## 2019-10-18 DIAGNOSIS — E0590 Thyrotoxicosis, unspecified without thyrotoxic crisis or storm: Secondary | ICD-10-CM | POA: Diagnosis not present

## 2019-10-18 LAB — TSH: TSH: 100.17 u[IU]/mL — ABNORMAL HIGH (ref 0.35–4.50)

## 2019-10-18 MED ORDER — LEVOTHYROXINE SODIUM 112 MCG PO TABS: 112.0000 ug | ORAL_TABLET | Freq: Every day | ORAL | 3 refills | Status: DC

## 2019-10-18 NOTE — Patient Instructions (Signed)
Blood tests are requested for you today.  We'll let you know about the results.  Please come back for a follow-up appointment in 2 months.   

## 2019-10-18 NOTE — Telephone Encounter (Signed)
Lab results reviewed by Dr. Ellison. Called pt to inform about lab results as well as new orders. Using closed-loop communication, pt verbalized complete acceptance and understanding of all information provided. No further questions nor concerns were voiced at this time. 

## 2019-10-18 NOTE — Telephone Encounter (Signed)
-----   Message from Renato Shin, MD sent at 10/18/2019  2:55 PM EST ----- please contact patient: Thyroid is really low.  This is why you don't feel well.  I have sent a prescription to your pharmacy, for a thyroid hormone pill.  You will feel much better on that.  I'll see you next time.

## 2019-10-18 NOTE — Progress Notes (Signed)
Subjective:    Patient ID: [name], female    DOB: 1971/05/09, 49 y.o.   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  Main symptom is generalized body pain.   Past Medical History:  Diagnosis Date  . Abdominal pain, left lower quadrant 03/26/2008  . ABDOMINAL PAIN, SUPRAPUBIC 08/04/2007  . ALLERGIC RHINITIS 08/04/2007  . Allergy   . Anemia   . ANXIETY 08/04/2007  . Cough    instructed to see primary care if worsens-no fever  . DEPRESSION 08/04/2007  . FATIGUE 08/04/2007  . GERD 08/04/2007  . Headache(784.0)   . Hyperlipidemia 05/20/2014  . Insomnia   . SINUSITIS- ACUTE-NOS 07/25/2010  . SVD (spontaneous vaginal delivery)    x 4  . Thyroid disease     Past Surgical History:  Procedure Laterality Date  . ABDOMINAL HYSTERECTOMY  2004  . LAPAROSCOPY  03/23/2012   Procedure: LAPAROSCOPY OPERATIVE;  Surgeon: Margarette Asal, MD;  Location: [facility];  Service: Gynecology;  Laterality: N/A;  with CO2 laser ablation of Vin;need microscope,vaginal vault excision of keloid;will want kenolog 40 from pharmacy.  Marland Kitchen LAPAROTOMY  05/02/2012   Procedure: EXPLORATORY LAPAROTOMY;  Surgeon: Margarette Asal, MD;  Location: [facility];  Service: Gynecology;  Laterality: N/A;  . RHINOPLASTY     nose  . SALPINGOOPHORECTOMY  05/02/2012   Procedure: SALPINGO OOPHERECTOMY;  Surgeon: Margarette Asal, MD;  Location: [facility];  Service: Gynecology;  Laterality: Bilateral;  bilateral  . SCAR REVISION  03/23/2012   Procedure: SCAR REVISION;  Surgeon: Margarette Asal, MD;  Location: [facility];  Service: Gynecology;  Laterality: N/A;  . SCAR REVISION N/A 08/08/2013   Procedure: SCAR REVISION ABDOMINAL;  Surgeon: Margarette Asal, MD;  Location: [facility];  Service: Gynecology;  Laterality: N/A;  . TUBAL LIGATION    . WISDOM  TOOTH EXTRACTION      Social History   Socioeconomic History  . Marital status: Divorced    Spouse name: Not on file  . Number of children: Not on file  . Years of education: Not on file  . Highest education level: Not on file  Occupational History  . Not on file  Tobacco Use  . Smoking status: Never Smoker  . Smokeless tobacco: Never Used  Substance and Sexual Activity  . Alcohol use: No  . Drug use: No  . Sexual activity: Yes    Birth control/protection: Surgical  Other Topics Concern  . Not on file  Social History Narrative  . Not on file   Social Determinants of Health   Financial Resource Strain:   . Difficulty of Paying Living Expenses: Not on file  Food Insecurity:   . Worried About Charity fundraiser in the Last Year: Not on file  . Ran Out of Food in the Last Year: Not on file  Transportation Needs:   . Lack of Transportation (Medical): Not on file  . Lack of Transportation (Non-Medical): Not on file  Physical Activity:   . Days of Exercise per Week: Not on file  . Minutes of Exercise per Session: Not on file  Stress:   . Feeling of Stress : Not on file  Social Connections:   . Frequency of Communication with Friends and Family: Not on file  . Frequency of  Social Gatherings with Friends and Family: Not on file  . Attends Religious Services: Not on file  . Active Member of Clubs or Organizations: Not on file  . Attends Archivist Meetings: Not on file  . Marital Status: Not on file  Intimate Partner Violence:   . Fear of Current or Ex-Partner: Not on file  . Emotionally Abused: Not on file  . Physically Abused: Not on file  . Sexually Abused: Not on file    Current Outpatient Medications on File Prior to Visit  Medication Sig Dispense Refill  . atorvastatin (LIPITOR) 20 MG tablet Take 1 tablet (20 mg total) by mouth daily. 90 tablet 3  . escitalopram (LEXAPRO) 10 MG tablet Take 1 tablet (10 mg total) by mouth daily. 90 tablet 3  .  hydrOXYzine (ATARAX/VISTARIL) 25 MG tablet Take 1 tablet (25 mg total) by mouth 3 (three) times daily as needed. 100 tablet 3  . Loratadine (CLARITIN) 10 MG CAPS Take by mouth.    . traZODone (DESYREL) 50 MG tablet Take 0.5-1 tablets (25-50 mg total) by mouth at bedtime as needed for sleep. 30 tablet 3  . triamcinolone cream (KENALOG) 0.1 % Apply 1 application topically 2 (two) times daily. 160 g 3  . Vitamin D, Ergocalciferol, (DRISDOL) 1.25 MG (50000 UNIT) CAPS capsule Take 1 capsule (50,000 Units total) by mouth every 7 (seven) days. 12 capsule 0   No current facility-administered medications on file prior to visit.    Allergies  Allergen Reactions  . Latex Shortness Of Breath, Swelling and Rash  . Methimazole Hives and Itching  . Other Itching and Nausea And Vomiting    Pain medication given after surgery; pt doesn't remember name.  Most likely opioid-derived.  . Soy Allergy Swelling    Family History  Problem Relation Age of Onset  . Asthma Mother   . Thyroid disease Neg Hx   . Colon cancer Neg Hx   . Esophageal cancer Neg Hx   . Rectal cancer Neg Hx   . Stomach cancer Neg Hx     BP 110/80 (BP Location: Left Arm, Patient Position: Sitting, Cuff Size: Normal)   Pulse 71   Ht 5\' 7"  (1.702 m)   Wt 184 lb 12.8 oz (83.8 kg)   SpO2 96%   BMI 28.94 kg/m    Review of Systems Denies fever.     Objective:   Physical Exam VITAL SIGNS:  See vs page. GENERAL: no distress.  NECK: thyroid is slightly enlarged (R>L).  No palpable nodule.     Lab Results  Component Value Date   TSH 100.17 (H) 10/18/2019      Assessment & Plan:  Post-RAI hypothyroidism, new.  I have sent a prescription to your pharmacy, for synthroid.  Myalgias, prob due to the above.  I told pt synthroid will help.  Please come back for a follow-up appointment in 2 months.

## 2019-12-16 ENCOUNTER — Other Ambulatory Visit: Payer: Self-pay | Admitting: Internal Medicine

## 2019-12-16 NOTE — Telephone Encounter (Signed)
Please change to OTC Vitamin D3 at 2000 units per day, indefinitely.  

## 2020-01-02 ENCOUNTER — Other Ambulatory Visit: Payer: Self-pay

## 2020-01-02 ENCOUNTER — Ambulatory Visit (INDEPENDENT_AMBULATORY_CARE_PROVIDER_SITE_OTHER): Payer: 59 | Admitting: Endocrinology

## 2020-01-02 ENCOUNTER — Encounter: Payer: Self-pay | Admitting: Endocrinology

## 2020-01-02 DIAGNOSIS — E890 Postprocedural hypothyroidism: Secondary | ICD-10-CM | POA: Diagnosis not present

## 2020-01-02 LAB — TSH: TSH: 1.4 u[IU]/mL (ref 0.35–4.50)

## 2020-01-02 NOTE — Progress Notes (Signed)
Subjective:    Patient ID: [name], female    DOB: 04-25-1971, 49 y.o.   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  Since on synthroid, she says itching persists, but she feels better overall.  Pt says she wants Dr Jenny Reichmann to take over rx of hypothyroidism, due to high copay here.   Past Medical History:  Diagnosis Date  . Abdominal pain, left lower quadrant 03/26/2008  . ABDOMINAL PAIN, SUPRAPUBIC 08/04/2007  . ALLERGIC RHINITIS 08/04/2007  . Allergy   . Anemia   . ANXIETY 08/04/2007  . Cough    instructed to see primary care if worsens-no fever  . DEPRESSION 08/04/2007  . FATIGUE 08/04/2007  . GERD 08/04/2007  . Headache(784.0)   . Hyperlipidemia 05/20/2014  . Insomnia   . SINUSITIS- ACUTE-NOS 07/25/2010  . SVD (spontaneous vaginal delivery)    x 4  . Thyroid disease     Past Surgical History:  Procedure Laterality Date  . ABDOMINAL HYSTERECTOMY  2004  . LAPAROSCOPY  03/23/2012   Procedure: LAPAROSCOPY OPERATIVE;  Surgeon: Margarette Asal, MD;  Location: [facility];  Service: Gynecology;  Laterality: N/A;  with CO2 laser ablation of Vin;need microscope,vaginal vault excision of keloid;will want kenolog 40 from pharmacy.  Marland Kitchen LAPAROTOMY  05/02/2012   Procedure: EXPLORATORY LAPAROTOMY;  Surgeon: Margarette Asal, MD;  Location: [facility];  Service: Gynecology;  Laterality: N/A;  . RHINOPLASTY     nose  . SALPINGOOPHORECTOMY  05/02/2012   Procedure: SALPINGO OOPHERECTOMY;  Surgeon: Margarette Asal, MD;  Location: [facility];  Service: Gynecology;  Laterality: Bilateral;  bilateral  . SCAR REVISION  03/23/2012   Procedure: SCAR REVISION;  Surgeon: Margarette Asal, MD;  Location: [facility];  Service: Gynecology;  Laterality: N/A;  . SCAR REVISION N/A 08/08/2013   Procedure: SCAR REVISION ABDOMINAL;   Surgeon: Margarette Asal, MD;  Location: [facility];  Service: Gynecology;  Laterality: N/A;  . TUBAL LIGATION    . WISDOM TOOTH EXTRACTION      Social History   Socioeconomic History  . Marital status: Divorced    Spouse name: Not on file  . Number of children: Not on file  . Years of education: Not on file  . Highest education level: Not on file  Occupational History  . Not on file  Tobacco Use  . Smoking status: Never Smoker  . Smokeless tobacco: Never Used  Substance and Sexual Activity  . Alcohol use: No  . Drug use: No  . Sexual activity: Yes    Birth control/protection: Surgical  Other Topics Concern  . Not on file  Social History Narrative  . Not on file   Social Determinants of Health   Financial Resource Strain:   . Difficulty of Paying Living Expenses:   Food Insecurity:   . Worried About Charity fundraiser in the Last Year:   . Arboriculturist in the Last Year:   Transportation Needs:   . Film/video editor (Medical):   Marland Kitchen Lack of Transportation (Non-Medical):   Physical Activity:   . Days of Exercise per Week:   . Minutes of Exercise per Session:   Stress:   . Feeling of Stress :   Social Connections:   . Frequency of Communication with Friends and  Family:   . Frequency of Social Gatherings with Friends and Family:   . Attends Religious Services:   . Active Member of Clubs or Organizations:   . Attends Archivist Meetings:   Marland Kitchen Marital Status:   Intimate Partner Violence:   . Fear of Current or Ex-Partner:   . Emotionally Abused:   Marland Kitchen Physically Abused:   . Sexually Abused:     Current Outpatient Medications on File Prior to Visit  Medication Sig Dispense Refill  . atorvastatin (LIPITOR) 20 MG tablet Take 1 tablet (20 mg total) by mouth daily. 90 tablet 3  . hydrOXYzine (ATARAX/VISTARIL) 25 MG tablet Take 1 tablet (25 mg total) by mouth 3 (three) times daily as needed. 100 tablet 3  . Loratadine (CLARITIN) 10 MG CAPS Take by mouth.     . traZODone (DESYREL) 50 MG tablet Take 0.5-1 tablets (25-50 mg total) by mouth at bedtime as needed for sleep. 30 tablet 3  . triamcinolone cream (KENALOG) 0.1 % Apply 1 application topically 2 (two) times daily. 160 g 3  . Vitamin D, Ergocalciferol, (DRISDOL) 1.25 MG (50000 UNIT) CAPS capsule Take 1 capsule (50,000 Units total) by mouth every 7 (seven) days. 12 capsule 0  . escitalopram (LEXAPRO) 10 MG tablet Take 1 tablet (10 mg total) by mouth daily. 90 tablet 3   No current facility-administered medications on file prior to visit.    Allergies  Allergen Reactions  . Latex Shortness Of Breath, Swelling and Rash  . Methimazole Hives and Itching  . Other Itching and Nausea And Vomiting    Pain medication given after surgery; pt doesn't remember name.  Most likely opioid-derived.  . Soy Allergy Swelling    Family History  Problem Relation Age of Onset  . Asthma Mother   . Thyroid disease Neg Hx   . Colon cancer Neg Hx   . Esophageal cancer Neg Hx   . Rectal cancer Neg Hx   . Stomach cancer Neg Hx     BP 126/88   Pulse 78   Ht 5\' 7"  (1.702 m)   Wt 177 lb (80.3 kg)   SpO2 98%   BMI 27.72 kg/m   Review of Systems Depression persists.  Denies SI.      Objective:   Physical Exam VITAL SIGNS:  See vs page GENERAL: no distress NECK: There is no palpable thyroid enlargement.  No thyroid nodule is palpable.  No palpable lymphadenopathy at the anterior neck.    Lab Results  Component Value Date   TSH 1.40 01/02/2020      Assessment & Plan:  Hypothyroidism: well-replaced.  Please continue the same medications I would be happy to see you back here as needed

## 2020-01-02 NOTE — Patient Instructions (Addendum)
Blood test is requested for you today.  We'll let you know about the results.   Based on the results, I will probably need to adjust the levothyroxine. I would be happy to see you back here as needed.

## 2020-01-03 ENCOUNTER — Telehealth: Payer: Self-pay

## 2020-01-03 ENCOUNTER — Other Ambulatory Visit: Payer: Self-pay

## 2020-01-03 DIAGNOSIS — E890 Postprocedural hypothyroidism: Secondary | ICD-10-CM

## 2020-01-03 MED ORDER — LEVOTHYROXINE SODIUM 112 MCG PO TABS: 112.0000 ug | ORAL_TABLET | Freq: Every day | ORAL | 3 refills | Status: DC

## 2020-01-03 NOTE — Telephone Encounter (Signed)
LAB RESULTS  Lab results were reviewed by Dr. Ellison. Called pt to inform about lab results as well as new orders. LVM requesting returned call. 

## 2020-01-03 NOTE — Telephone Encounter (Signed)
-----   Message from Renato Shin, MD sent at 01/02/2020  5:46 PM EDT ----- please contact patient: I thought the thyroid might be off, but it is normal. Please continue the same medication.  I hope you feel well.

## 2020-01-03 NOTE — Telephone Encounter (Signed)
Pt returned call. Informed of lab results and orders. Verbalized acceptance and understanding.

## 2020-04-08 ENCOUNTER — Telehealth: Payer: Self-pay | Admitting: Internal Medicine

## 2020-04-08 DIAGNOSIS — E039 Hypothyroidism, unspecified: Secondary | ICD-10-CM

## 2020-04-08 DIAGNOSIS — E785 Hyperlipidemia, unspecified: Secondary | ICD-10-CM

## 2020-04-08 DIAGNOSIS — E559 Vitamin D deficiency, unspecified: Secondary | ICD-10-CM

## 2020-04-08 NOTE — Telephone Encounter (Signed)
New message:   Pt is calling and would like orders to check her thyroid levels before her appt on 04/17/20. Please advise.

## 2020-04-12 NOTE — Telephone Encounter (Signed)
Ok labs are ordered 

## 2020-04-12 NOTE — Telephone Encounter (Signed)
Sent to Dr. John to advise. 

## 2020-04-17 ENCOUNTER — Other Ambulatory Visit: Payer: Self-pay

## 2020-04-17 ENCOUNTER — Ambulatory Visit (INDEPENDENT_AMBULATORY_CARE_PROVIDER_SITE_OTHER): Payer: 59 | Admitting: Internal Medicine

## 2020-04-17 ENCOUNTER — Encounter: Payer: Self-pay | Admitting: Internal Medicine

## 2020-04-17 VITALS — BP 110/70 | HR 72 | Temp 97.9°F | Ht 67.0 in | Wt 177.0 lb

## 2020-04-17 DIAGNOSIS — R5383 Other fatigue: Secondary | ICD-10-CM

## 2020-04-17 DIAGNOSIS — R739 Hyperglycemia, unspecified: Secondary | ICD-10-CM

## 2020-04-17 DIAGNOSIS — G4700 Insomnia, unspecified: Secondary | ICD-10-CM

## 2020-04-17 DIAGNOSIS — E785 Hyperlipidemia, unspecified: Secondary | ICD-10-CM | POA: Diagnosis not present

## 2020-04-17 DIAGNOSIS — E890 Postprocedural hypothyroidism: Secondary | ICD-10-CM | POA: Diagnosis not present

## 2020-04-17 MED ORDER — TEMAZEPAM 15 MG PO CAPS: ORAL_CAPSULE | ORAL | 2 refills | Status: DC

## 2020-04-17 NOTE — Assessment & Plan Note (Signed)
stable overall by history and exam, recent data reviewed with pt, and pt to continue medical treatment as before,  to f/u any worsening symptoms or concerns  

## 2020-04-17 NOTE — Assessment & Plan Note (Signed)
For temazepam qhs prn

## 2020-04-17 NOTE — Progress Notes (Signed)
Subjective:    Patient ID: [name], female    DOB: 03-11-1971, 49 y.o.   MRN: [id number]  HPI   Here to f/u; overall doing ok,  Pt denies chest pain, increasing sob or doe, wheezing, orthopnea, PND, increased LE swelling, palpitations, dizziness or syncope.  Pt denies new neurological symptoms such as new headache, or facial or extremity weakness or numbness.  Pt denies polydipsia, polyuria.  Does c/o ongoing fatigue, but denies signficant daytime hypersomnolence.  Denies hyper or hypo thyroid symptoms such as voice, skin or hair change.  Working on better diet, oping for better LDL. Past Medical History:  Diagnosis Date  . Abdominal pain, left lower quadrant 03/26/2008  . ABDOMINAL PAIN, SUPRAPUBIC 08/04/2007  . ALLERGIC RHINITIS 08/04/2007  . Allergy   . Anemia   . ANXIETY 08/04/2007  . Cough    instructed to see primary care if worsens-no fever  . DEPRESSION 08/04/2007  . FATIGUE 08/04/2007  . GERD 08/04/2007  . Headache(784.0)   . Hyperlipidemia 05/20/2014  . Insomnia   . SINUSITIS- ACUTE-NOS 07/25/2010  . SVD (spontaneous vaginal delivery)    x 4  . Thyroid disease    Past Surgical History:  Procedure Laterality Date  . ABDOMINAL HYSTERECTOMY  2004  . LAPAROSCOPY  03/23/2012   Procedure: LAPAROSCOPY OPERATIVE;  Surgeon: Margarette Asal, MD;  Location: [facility];  Service: Gynecology;  Laterality: N/A;  with CO2 laser ablation of Vin;need microscope,vaginal vault excision of keloid;will want kenolog 40 from pharmacy.  Marland Kitchen LAPAROTOMY  05/02/2012   Procedure: EXPLORATORY LAPAROTOMY;  Surgeon: Margarette Asal, MD;  Location: [facility];  Service: Gynecology;  Laterality: N/A;  . RHINOPLASTY     nose  . SALPINGOOPHORECTOMY  05/02/2012   Procedure: SALPINGO OOPHERECTOMY;  Surgeon: Margarette Asal, MD;  Location: [facility];  Service: Gynecology;  Laterality: Bilateral;  bilateral  . SCAR REVISION  03/23/2012   Procedure: SCAR REVISION;  Surgeon: Margarette Asal, MD;  Location: [facility];   Service: Gynecology;  Laterality: N/A;  . SCAR REVISION N/A 08/08/2013   Procedure: SCAR REVISION ABDOMINAL;  Surgeon: Margarette Asal, MD;  Location: [facility];  Service: Gynecology;  Laterality: N/A;  . TUBAL LIGATION    . WISDOM TOOTH EXTRACTION      reports that she has never smoked. She has never used smokeless tobacco. She reports that she does not drink alcohol and does not use drugs. family history includes Asthma in her mother. Allergies  Allergen Reactions  . Latex Shortness Of Breath, Swelling and Rash  . Lipitor [Atorvastatin] Other (See Comments)    myalgia  . Methimazole Hives and Itching  . Other Itching and Nausea And Vomiting    Pain medication given after surgery; pt doesn't remember name.  Most likely opioid-derived.  . Soy Allergy Swelling   Current Outpatient Medications on File Prior to Visit  Medication Sig Dispense Refill  . atorvastatin (LIPITOR) 20 MG tablet Take 1 tablet (20 mg total) by mouth daily. 90 tablet 3  . hydrOXYzine (ATARAX/VISTARIL) 25 MG tablet Take 1 tablet (25 mg total) by mouth 3 (three) times daily as needed. 100 tablet 3  . levothyroxine (SYNTHROID) 112 MCG tablet Take 1 tablet (112 mcg total) by mouth daily. 30 tablet 3  . Loratadine (CLARITIN) 10 MG CAPS Take by mouth.    . traZODone (DESYREL) 50 MG tablet Take 0.5-1 tablets (25-50 mg total) by mouth at bedtime as needed for sleep. 30 tablet 3  .  triamcinolone cream (KENALOG) 0.1 % Apply 1 application topically 2 (two) times daily. 160 g 3  . escitalopram (LEXAPRO) 10 MG tablet Take 1 tablet (10 mg total) by mouth daily. 90 tablet 3   No current facility-administered medications on file prior to visit.   Review of Systems All otherwise neg per pt    Objective:   Physical Exam BP 110/70 (BP Location: Left Arm, Patient Position: Sitting, Cuff Size: Large)   Pulse 72   Temp 97.9 F (36.6 C) (Oral)   Ht 5\' 7"  (1.702 m)   Wt 177 lb (80.3 kg)   SpO2 96%   BMI 27.72 kg/m  VS noted,    Constitutional: Pt appears in NAD HENT: Head: NCAT.  Right Ear: External ear normal.  Left Ear: External ear normal.  Eyes: . Pupils are equal, round, and reactive to light. Conjunctivae and EOM are normal Nose: without d/c or deformity Neck: Neck supple. Gross normal ROM Cardiovascular: Normal rate and regular rhythm.   Pulmonary/Chest: Effort normal and breath sounds without rales or wheezing.  Abd:  Soft, NT, ND, + BS, no organomegaly Neurological: Pt is alert. At baseline orientation, motor grossly intact Skin: Skin is warm. No rashes, other new lesions, no LE edema Psychiatric: Pt behavior is normal without agitation  Lab Results  Component Value Date   WBC 5.5 09/20/2019   HGB 12.9 09/20/2019   HCT 39.2 09/20/2019   PLT 258.0 09/20/2019   GLUCOSE 85 09/20/2019   CHOL 291 (H) 09/20/2019   TRIG 83.0 09/20/2019   HDL 52.20 09/20/2019   LDLCALC 223 (H) 09/20/2019   ALT 20 09/20/2019   AST 17 09/20/2019   NA 138 09/20/2019   K 3.6 09/20/2019   CL 102 09/20/2019   CREATININE 0.53 09/20/2019   BUN 13 09/20/2019   CO2 29 09/20/2019   TSH 1.40 01/02/2020   HGBA1C 6.0 09/20/2019          Assessment & Plan:

## 2020-04-17 NOTE — Patient Instructions (Signed)
Please take all new medication as prescribed - the medication for sleep  Please continue all other medications as before, and refills have been done if requested.  Please have the pharmacy call with any other refills you may need.  Please continue your efforts at being more active, low cholesterol diet, and weight control.  Please keep your appointments with your specialists as you may have planned  Please go to the LAB at the blood drawing area for the tests to be done - tomorrow  You will be contacted by phone if any changes need to be made immediately.  Otherwise, you will receive a letter about your results with an explanation, but please check with MyChart first.  Please remember to sign up for MyChart if you have not done so, as this will be important to you in the future with finding out test results, communicating by private email, and scheduling acute appointments online when needed.

## 2020-04-17 NOTE — Assessment & Plan Note (Addendum)
Etiology unclear,  to f/u any worsening symptoms or concerns  I spent 31 minutes in addition to time for CPX wellness examination in preparing to see the patient by review of recent labs, imaging and procedures, obtaining and reviewing separately obtained history, communicating with the patient and family or caregiver, ordering medications, tests or procedures, and documenting clinical information in the EHR including the differential Dx, treatment, and any further evaluation and other management of fatigue, insomnia, low thyroid, hld, hyperglycemia

## 2020-04-18 ENCOUNTER — Other Ambulatory Visit: Payer: 59

## 2020-04-18 DIAGNOSIS — E785 Hyperlipidemia, unspecified: Secondary | ICD-10-CM

## 2020-04-18 DIAGNOSIS — E559 Vitamin D deficiency, unspecified: Secondary | ICD-10-CM

## 2020-04-18 DIAGNOSIS — R5383 Other fatigue: Secondary | ICD-10-CM

## 2020-04-18 DIAGNOSIS — E039 Hypothyroidism, unspecified: Secondary | ICD-10-CM

## 2020-04-19 ENCOUNTER — Encounter: Payer: Self-pay | Admitting: Internal Medicine

## 2020-04-19 ENCOUNTER — Other Ambulatory Visit: Payer: Self-pay | Admitting: Internal Medicine

## 2020-04-19 LAB — COMPLETE METABOLIC PANEL WITH GFR
AG Ratio: 1.6 (calc) (ref 1.0–2.5)
ALT: 18 U/L (ref 6–29)
AST: 15 U/L (ref 10–35)
Albumin: 4.7 g/dL (ref 3.6–5.1)
Alkaline phosphatase (APISO): 78 U/L (ref 37–153)
BUN: 14 mg/dL (ref 7–25)
CO2: 26 mmol/L (ref 20–32)
Calcium: 9.7 mg/dL (ref 8.6–10.4)
Chloride: 104 mmol/L (ref 98–110)
Creat: 0.54 mg/dL (ref 0.50–1.05)
GFR, Est African American: 127 mL/min/{1.73_m2} (ref >=60)
GFR, Est Non African American: 109 mL/min/{1.73_m2} (ref >=60)
Globulin: 3 g/dL (calc) (ref 1.9–3.7)
Glucose, Bld: 98 mg/dL (ref 65–99)
Potassium: 4.4 mmol/L (ref 3.5–5.3)
Sodium: 139 mmol/L (ref 135–146)
Total Bilirubin: 0.5 mg/dL (ref 0.2–1.2)
Total Protein: 7.7 g/dL (ref 6.1–8.1)

## 2020-04-19 LAB — LIPID PANEL
Cholesterol: 271 mg/dL — ABNORMAL HIGH (ref <200)
HDL: 48 mg/dL — ABNORMAL LOW (ref >=50)
LDL Cholesterol (Calc): 202 mg/dL (calc) — ABNORMAL HIGH
Non-HDL Cholesterol (Calc): 223 mg/dL (calc) — ABNORMAL HIGH (ref <130)
Total CHOL/HDL Ratio: 5.6 (calc) — ABNORMAL HIGH (ref <5.0)
Triglycerides: 91 mg/dL (ref <150)

## 2020-04-19 LAB — CBC WITH DIFFERENTIAL/PLATELET
Absolute Monocytes: 221 cells/uL (ref 200–950)
Basophils Absolute: 28 cells/uL (ref 0–200)
Basophils Relative: 0.6 %
Eosinophils Absolute: 71 cells/uL (ref 15–500)
Eosinophils Relative: 1.5 %
HCT: 42.4 % (ref 35.0–45.0)
Hemoglobin: 14 g/dL (ref 11.7–15.5)
Lymphs Abs: 1683 cells/uL (ref 850–3900)
MCH: 29.5 pg (ref 27.0–33.0)
MCHC: 33 g/dL (ref 32.0–36.0)
MCV: 89.5 fL (ref 80.0–100.0)
MPV: 11.3 fL (ref 7.5–12.5)
Monocytes Relative: 4.7 %
Neutro Abs: 2698 cells/uL (ref 1500–7800)
Neutrophils Relative %: 57.4 %
Platelets: 248 10*3/uL (ref 140–400)
RBC: 4.74 10*6/uL (ref 3.80–5.10)
RDW: 12.8 % (ref 11.0–15.0)
Total Lymphocyte: 35.8 %
WBC: 4.7 10*3/uL (ref 3.8–10.8)

## 2020-04-19 LAB — TSH: TSH: 0.66 mIU/L

## 2020-04-19 LAB — VITAMIN D 25 HYDROXY (VIT D DEFICIENCY, FRACTURES): Vit D, 25-Hydroxy: 26 ng/mL — ABNORMAL LOW (ref 30–100)

## 2020-04-19 LAB — T4, FREE: Free T4: 1.7 ng/dL (ref 0.8–1.8)

## 2020-04-19 MED ORDER — VITAMIN D (ERGOCALCIFEROL) 1.25 MG (50000 UNIT) PO CAPS: 50000.0000 [IU] | ORAL_CAPSULE | ORAL | 0 refills | Status: DC

## 2020-04-19 NOTE — Progress Notes (Signed)
LVM for pt to call back for result notes.   Letter sent, cont same tx  - The test results show that your current treatment is OK, as the tests are stable , except the LDL cholesterol is severely high, and the Vitamin D level is low. Please follow a lower cholesterol diet, and let us know if you would want to try Crestor for high cholesterol. Also, Please take Vitamin D 50000 units weekly for 12 weeks, then plan to change to OTC Vitamin D3 at 2000 units per day, indefinitely.   There is no other need for change of treatment or further evaluation based on these results, at this time.

## 2020-04-30 ENCOUNTER — Other Ambulatory Visit: Payer: Self-pay | Admitting: Endocrinology

## 2020-04-30 DIAGNOSIS — E890 Postprocedural hypothyroidism: Secondary | ICD-10-CM

## 2020-05-30 ENCOUNTER — Other Ambulatory Visit: Payer: Self-pay | Admitting: Endocrinology

## 2020-05-30 DIAGNOSIS — E890 Postprocedural hypothyroidism: Secondary | ICD-10-CM

## 2020-07-16 ENCOUNTER — Other Ambulatory Visit: Payer: Self-pay | Admitting: Internal Medicine

## 2020-07-16 NOTE — Telephone Encounter (Signed)
Please change to OTC Vitamin D3 at 2000 units per day, indefinitely.  

## 2020-09-06 IMAGING — NM NM RAI THERAPY FOR HYPERTHYROIDISM
1 series · 1 of 1 positions shown · non-contrast
Comparison: Thyroid uptake and scan 06/09/2019

CLINICAL DATA: Hyperthyroidism due to Graves disease

EXAM:
RADIOACTIVE IODINE THERAPY FOR HYPERTHYROIDISM
TECHNIQUE: Radioactive iodine prescribed by Dr. Lung. The risks and benefits
of radioactive iodine therapy were discussed with the patient in
detail by Dr. Brisel. Alternative therapies were also mentioned.
Radiation safety was discussed with the patient, including how to
protect the general public from exposure. There were no barriers to
communication. Written consent was obtained. The patient then
received a capsule containing the radiopharmaceutical.
The patient will follow-up with the referring physician.
RADIOPHARMACEUTICALS:  19.2 mCi S-050 sodium iodide orally

[Series 1: static · 2.07mm/px · 1 of 1 slices shown]
[im 1/1]
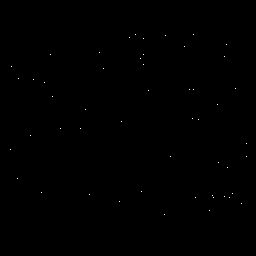

[1 of 1 positions shown; findings below may reference images not displayed]

IMPRESSION: Per oral administration of S-050 sodium iodide for the treatment of
hyperthyroidism.

## 2020-09-20 ENCOUNTER — Ambulatory Visit: Payer: 59 | Admitting: Internal Medicine

## 2020-09-24 ENCOUNTER — Other Ambulatory Visit: Payer: Self-pay | Admitting: Endocrinology

## 2020-09-24 DIAGNOSIS — E890 Postprocedural hypothyroidism: Secondary | ICD-10-CM

## 2020-10-01 ENCOUNTER — Other Ambulatory Visit: Payer: Self-pay

## 2020-10-02 ENCOUNTER — Encounter: Payer: Self-pay | Admitting: Internal Medicine

## 2020-10-02 ENCOUNTER — Ambulatory Visit (INDEPENDENT_AMBULATORY_CARE_PROVIDER_SITE_OTHER): Payer: 59 | Admitting: Internal Medicine

## 2020-10-02 VITALS — BP 112/74 | HR 70 | Temp 98.0°F | Ht 67.0 in | Wt 179.0 lb

## 2020-10-02 DIAGNOSIS — E7849 Other hyperlipidemia: Secondary | ICD-10-CM

## 2020-10-02 DIAGNOSIS — G720 Drug-induced myopathy: Secondary | ICD-10-CM | POA: Diagnosis not present

## 2020-10-02 DIAGNOSIS — R739 Hyperglycemia, unspecified: Secondary | ICD-10-CM

## 2020-10-02 DIAGNOSIS — Z0000 Encounter for general adult medical examination without abnormal findings: Secondary | ICD-10-CM | POA: Diagnosis not present

## 2020-10-02 DIAGNOSIS — T466X5A Adverse effect of antihyperlipidemic and antiarteriosclerotic drugs, initial encounter: Secondary | ICD-10-CM

## 2020-10-02 DIAGNOSIS — E559 Vitamin D deficiency, unspecified: Secondary | ICD-10-CM | POA: Diagnosis not present

## 2020-10-02 DIAGNOSIS — E890 Postprocedural hypothyroidism: Secondary | ICD-10-CM

## 2020-10-02 DIAGNOSIS — Z1159 Encounter for screening for other viral diseases: Secondary | ICD-10-CM

## 2020-10-02 LAB — LIPID PANEL
Cholesterol: 229 mg/dL — ABNORMAL HIGH (ref 0–200)
HDL: 43.9 mg/dL (ref >39.00)
LDL Cholesterol: 170 mg/dL — ABNORMAL HIGH (ref 0–99)
NonHDL: 184.77
Total CHOL/HDL Ratio: 5
Triglycerides: 76 mg/dL (ref 0.0–149.0)
VLDL: 15.2 mg/dL (ref 0.0–40.0)

## 2020-10-02 LAB — CBC WITH DIFFERENTIAL/PLATELET
Basophils Absolute: 0 10*3/uL (ref 0.0–0.1)
Basophils Relative: 0.6 % (ref 0.0–3.0)
Eosinophils Absolute: 0.1 10*3/uL (ref 0.0–0.7)
Eosinophils Relative: 2.4 % (ref 0.0–5.0)
HCT: 38.9 % (ref 36.0–46.0)
Hemoglobin: 13 g/dL (ref 12.0–15.0)
Lymphocytes Relative: 36.6 % (ref 12.0–46.0)
Lymphs Abs: 1.9 10*3/uL (ref 0.7–4.0)
MCHC: 33.5 g/dL (ref 30.0–36.0)
MCV: 89.5 fl (ref 78.0–100.0)
Monocytes Absolute: 0.2 10*3/uL (ref 0.1–1.0)
Monocytes Relative: 4.4 % (ref 3.0–12.0)
Neutro Abs: 2.9 10*3/uL (ref 1.4–7.7)
Neutrophils Relative %: 56 % (ref 43.0–77.0)
Platelets: 253 10*3/uL (ref 150.0–400.0)
RBC: 4.34 Mil/uL (ref 3.87–5.11)
RDW: 13.8 % (ref 11.5–15.5)
WBC: 5.2 10*3/uL (ref 4.0–10.5)

## 2020-10-02 LAB — BASIC METABOLIC PANEL
BUN: 14 mg/dL (ref 6–23)
CO2: 30 mEq/L (ref 19–32)
Calcium: 9.5 mg/dL (ref 8.4–10.5)
Chloride: 104 mEq/L (ref 96–112)
Creatinine, Ser: 0.58 mg/dL (ref 0.40–1.20)
GFR: 104.51 mL/min (ref >60.00)
Glucose, Bld: 89 mg/dL (ref 70–99)
Potassium: 4.1 mEq/L (ref 3.5–5.1)
Sodium: 140 mEq/L (ref 135–145)

## 2020-10-02 LAB — URINALYSIS, ROUTINE W REFLEX MICROSCOPIC
Bilirubin Urine: NEGATIVE
Hgb urine dipstick: NEGATIVE
Ketones, ur: NEGATIVE
Leukocytes,Ua: NEGATIVE
Nitrite: NEGATIVE
Specific Gravity, Urine: 1.02 (ref 1.000–1.030)
Total Protein, Urine: NEGATIVE
Urine Glucose: NEGATIVE
Urobilinogen, UA: 0.2 (ref 0.0–1.0)
pH: 7 (ref 5.0–8.0)

## 2020-10-02 LAB — HEPATIC FUNCTION PANEL
ALT: 18 U/L (ref 0–35)
AST: 17 U/L (ref 0–37)
Albumin: 4.5 g/dL (ref 3.5–5.2)
Alkaline Phosphatase: 78 U/L (ref 39–117)
Bilirubin, Direct: 0.1 mg/dL (ref 0.0–0.3)
Total Bilirubin: 0.5 mg/dL (ref 0.2–1.2)
Total Protein: 7.7 g/dL (ref 6.0–8.3)

## 2020-10-02 LAB — TSH: TSH: 1.29 u[IU]/mL (ref 0.35–4.50)

## 2020-10-02 LAB — T4, FREE: Free T4: 1.15 ng/dL (ref 0.60–1.60)

## 2020-10-02 LAB — VITAMIN D 25 HYDROXY (VIT D DEFICIENCY, FRACTURES): VITD: 36.94 ng/mL (ref 30.00–100.00)

## 2020-10-02 LAB — HEMOGLOBIN A1C: Hgb A1c MFr Bld: 6 % (ref 4.6–6.5)

## 2020-10-02 MED ORDER — LEVOTHYROXINE SODIUM 112 MCG PO TABS
ORAL_TABLET | ORAL | 3 refills | Status: DC
Start: 2020-10-02 — End: 2022-07-08

## 2020-10-02 MED ORDER — ESCITALOPRAM OXALATE 10 MG PO TABS: 10.0000 mg | ORAL_TABLET | Freq: Every day | ORAL | 3 refills | Status: DC

## 2020-10-02 MED ORDER — TRAZODONE HCL 50 MG PO TABS: 25.0000 mg | ORAL_TABLET | Freq: Every evening | ORAL | 1 refills | Status: AC | PRN

## 2020-10-02 MED ORDER — ATORVASTATIN CALCIUM 20 MG PO TABS: 20.0000 mg | ORAL_TABLET | Freq: Every day | ORAL | 3 refills | Status: DC

## 2020-10-02 NOTE — Patient Instructions (Signed)
Please take OTC Vitamin D3 at 2000 units per day, indefinitely.  Please continue all other medications as before, and refills have been done if requested.  Please have the pharmacy call with any other refills you may need.  Please continue your efforts at being more active, low cholesterol diet, and weight control.  You are otherwise up to date with prevention measures today.  Please keep your appointments with your specialists as you may have planned  You will be contacted regarding the referral for: lipid clinic  Please go to the LAB at the blood drawing area for the tests to be done  You will be contacted by phone if any changes need to be made immediately.  Otherwise, you will receive a letter about your results with an explanation, but please check with MyChart first.  Please remember to sign up for MyChart if you have not done so, as this will be important to you in the future with finding out test results, communicating by private email, and scheduling acute appointments online when needed.  Please make an Appointment to return for your 1 year visit, or sooner if needed

## 2020-10-02 NOTE — Progress Notes (Signed)
Patient ID: [name], female   DOB: 1971/08/04, 50 y.o.   MRN: [id number]         Chief Complaint:: wellness exam and HLD, statin myopathy, low vit d, hypothryoidism       HPI:  [name] is a 50 y.o. female here for wellness exam, up to date with immunizations and preventive referrals.                        Also has statin intolerance with muscle pain.  Taking vit d 2000 u.  Denies hyper or hypo thyroid symptoms such as voice, skin or hair change.  Pt denies chest pain, increased sob or doe, wheezing, orthopnea, PND, increased LE swelling, palpitations, dizziness or syncope.   Pt denies polydipsia, polyuria,   Wt Readings from Last 3 Encounters:  10/02/20 179 lb (81.2 kg)  04/17/20 177 lb (80.3 kg)  01/02/20 177 lb (80.3 kg)   BP Readings from Last 3 Encounters:  10/02/20 112/74  04/17/20 110/70  01/02/20 126/88   Immunization History  Administered Date(s) Administered  . Influenza Split 05/03/2012  . Influenza Whole 05/23/2008  . Influenza,inj,Quad PF,6+ Mos 05/18/2014, 06/12/2015, 06/12/2016, 10/27/2017  . PFIZER(Purple Top)SARS-COV-2 Vaccination 02/15/2020, 03/06/2020, 08/12/2020  . Tdap 03/29/2013   There are no preventive care reminders to display for this patient.    Past Medical History:  Diagnosis Date  . Abdominal pain, left lower quadrant 03/26/2008  . ABDOMINAL PAIN, SUPRAPUBIC 08/04/2007  . ALLERGIC RHINITIS 08/04/2007  . Allergy   . Anemia   . ANXIETY 08/04/2007  . Cough    instructed to see primary care if worsens-no fever  . DEPRESSION 08/04/2007  . FATIGUE 08/04/2007  . GERD 08/04/2007  . Headache(784.0)   . Hyperlipidemia 05/20/2014  . Insomnia   . SINUSITIS- ACUTE-NOS 07/25/2010  . SVD (spontaneous vaginal delivery)    x 4  . Thyroid disease    Past Surgical History:  Procedure Laterality Date  . ABDOMINAL HYSTERECTOMY  2004  . LAPAROSCOPY  03/23/2012   Procedure: LAPAROSCOPY OPERATIVE;  Surgeon: Margarette Asal, MD;  Location: [facility];  Service:  Gynecology;  Laterality: N/A;  with CO2 laser ablation of Vin;need microscope,vaginal vault excision of keloid;will want kenolog 40 from pharmacy.  Marland Kitchen LAPAROTOMY  05/02/2012   Procedure: EXPLORATORY LAPAROTOMY;  Surgeon: Margarette Asal, MD;  Location: [facility];  Service: Gynecology;  Laterality: N/A;  . RHINOPLASTY     nose  . SALPINGOOPHORECTOMY  05/02/2012   Procedure: SALPINGO OOPHERECTOMY;  Surgeon: Margarette Asal, MD;  Location: [facility];  Service: Gynecology;  Laterality: Bilateral;  bilateral  . SCAR REVISION  03/23/2012   Procedure: SCAR REVISION;  Surgeon: Margarette Asal, MD;  Location: [facility];  Service: Gynecology;  Laterality: N/A;  . SCAR REVISION N/A 08/08/2013   Procedure: SCAR REVISION ABDOMINAL;  Surgeon: Margarette Asal, MD;  Location: [facility];  Service: Gynecology;  Laterality: N/A;  . TUBAL LIGATION    . WISDOM TOOTH EXTRACTION      reports that she has never smoked. She has never used smokeless tobacco. She reports that she does not drink alcohol and does not use drugs. family history includes Asthma in her mother. Allergies  Allergen Reactions  . Latex Shortness Of Breath, Swelling and Rash  . Codeine Itching  . Crestor [Rosuvastatin]   . Lipitor [Atorvastatin] Other (See Comments)    myalgia  . Methimazole Hives and Itching  . Other Itching  and Nausea And Vomiting    Pain medication given after surgery; pt doesn't remember name.  Most likely opioid-derived.  . Soy Allergy Swelling   Current Outpatient Medications on File Prior to Visit  Medication Sig Dispense Refill  . hydrOXYzine (ATARAX/VISTARIL) 25 MG tablet Take 1 tablet (25 mg total) by mouth 3 (three) times daily as needed. 100 tablet 3  . Loratadine 10 MG CAPS Take by mouth.    . triamcinolone cream (KENALOG) 0.1 % Apply 1 application topically 2 (two) times daily. 160 g 3   No current facility-administered medications on file prior to visit.        ROS:  All others reviewed and  negative.  Objective        PE:  BP 112/74   Pulse 70   Temp 98 F (36.7 C) (Oral)   Ht 5\' 7"  (1.702 m)   Wt 179 lb (81.2 kg)   SpO2 98%   BMI 28.04 kg/m                 Constitutional: Pt appears in NAD               HENT: Head: NCAT.                Right Ear: External ear normal.                 Left Ear: External ear normal.                Eyes: . Pupils are equal, round, and reactive to light. Conjunctivae and EOM are normal               Nose: without d/c or deformity               Neck: Neck supple. Gross normal ROM               Cardiovascular: Normal rate and regular rhythm.                 Pulmonary/Chest: Effort normal and breath sounds without rales or wheezing.                Abd:  Soft, NT, ND, + BS, no organomegaly               Neurological: Pt is alert. At baseline orientation, motor grossly intact               Skin: Skin is warm. No rashes, no other new lesions, LE edema - none               Psychiatric: Pt behavior is normal without agitation   Micro: none  Cardiac tracings I have personally interpreted today:  none  Pertinent Radiological findings (summarize): none   Lab Results  Component Value Date   WBC 5.2 10/02/2020   HGB 13.0 10/02/2020   HCT 38.9 10/02/2020   PLT 253.0 10/02/2020   GLUCOSE 89 10/02/2020   CHOL 229 (H) 10/02/2020   TRIG 76.0 10/02/2020   HDL 43.90 10/02/2020   LDLCALC 170 (H) 10/02/2020   ALT 18 10/02/2020   AST 17 10/02/2020   NA 140 10/02/2020   K 4.1 10/02/2020   CL 104 10/02/2020   CREATININE 0.58 10/02/2020   BUN 14 10/02/2020   CO2 30 10/02/2020   TSH 1.29 10/02/2020   HGBA1C 6.0 10/02/2020   Assessment/Plan:  [name] is a 50 y.o. Asian [4] female with  has  a past medical history of Abdominal pain, left lower quadrant (03/26/2008), ABDOMINAL PAIN, SUPRAPUBIC (08/04/2007), ALLERGIC RHINITIS (08/04/2007), Allergy, Anemia, ANXIETY (08/04/2007), Cough, DEPRESSION (08/04/2007), FATIGUE (08/04/2007), GERD  (08/04/2007), Headache(784.0), Hyperlipidemia (05/20/2014), Insomnia, SINUSITIS- ACUTE-NOS (07/25/2010), SVD (spontaneous vaginal delivery), and Thyroid disease.  Preventative health care Age and sex appropriate education and counseling updated with regular exercise and diet Referrals for preventative services - none needed Immunizations addressed - none needed Smoking counseling  - none needed Evidence for depression or other mood disorder - none significant Most recent labs reviewed. I have personally reviewed and have noted: 1) the patient's medical and social history 2) The patient's current medications and supplements 3) The patient's height, weight, and BMI have been recorded in the chart   Hyperglycemia Lab Results  Component Value Date   HGBA1C 6.0 10/02/2020   Stable, pt to continue current medical treatment  - diet   Hyperlipidemia Lab Results  Component Value Date   LDLCALC 170 (H) 10/02/2020   Stable, pt to continue low chol diet, but refer lipids clinic as has been statin intolerant  Hypothyroidism Lab Results  Component Value Date   TSH 1.29 10/02/2020   Stable, pt to continue levothyroxine 112  Statin myopathy declnes further statin trial  Followup: Return in about 1 year (around 10/02/2021).  Cathlean Cower, MD 10/06/2020 2:53 AM [facility]

## 2020-10-03 LAB — HEPATITIS C ANTIBODY
Hepatitis C Ab: NONREACTIVE
SIGNAL TO CUT-OFF: 0.01 (ref <1.00)

## 2020-10-06 ENCOUNTER — Encounter: Payer: Self-pay | Admitting: Internal Medicine

## 2020-10-06 NOTE — Assessment & Plan Note (Signed)
declnes further statin trial

## 2020-10-06 NOTE — Assessment & Plan Note (Signed)
Lab Results  Component Value Date   TSH 1.29 10/02/2020   Stable, pt to continue levothyroxine 112

## 2020-10-06 NOTE — Assessment & Plan Note (Signed)

## 2020-10-06 NOTE — Assessment & Plan Note (Signed)
Lab Results  Component Value Date   LDLCALC 170 (H) 10/02/2020   Stable, pt to continue low chol diet, but refer lipids clinic as has been statin intolerant

## 2020-10-06 NOTE — Assessment & Plan Note (Signed)
Lab Results  °Component Value Date  ° HGBA1C 6.0 10/02/2020  ° °Stable, pt to continue current medical treatment  - diet ° °

## 2020-12-30 ENCOUNTER — Ambulatory Visit: Payer: 59 | Admitting: Internal Medicine

## 2021-01-15 ENCOUNTER — Encounter: Payer: Self-pay | Admitting: Internal Medicine

## 2021-08-06 ENCOUNTER — Ambulatory Visit (INDEPENDENT_AMBULATORY_CARE_PROVIDER_SITE_OTHER): Payer: 59 | Admitting: Internal Medicine

## 2021-08-06 ENCOUNTER — Encounter: Payer: Self-pay | Admitting: Internal Medicine

## 2021-08-06 ENCOUNTER — Other Ambulatory Visit: Payer: Self-pay

## 2021-08-06 VITALS — BP 122/80 | HR 59 | Resp 18 | Ht 67.0 in | Wt 162.2 lb

## 2021-08-06 DIAGNOSIS — E7800 Pure hypercholesterolemia, unspecified: Secondary | ICD-10-CM | POA: Diagnosis not present

## 2021-08-06 DIAGNOSIS — J069 Acute upper respiratory infection, unspecified: Secondary | ICD-10-CM

## 2021-08-06 DIAGNOSIS — R739 Hyperglycemia, unspecified: Secondary | ICD-10-CM | POA: Diagnosis not present

## 2021-08-06 DIAGNOSIS — E890 Postprocedural hypothyroidism: Secondary | ICD-10-CM | POA: Diagnosis not present

## 2021-08-06 MED ORDER — AZITHROMYCIN 250 MG PO TABS: ORAL_TABLET | ORAL | 0 refills | Status: AC

## 2021-08-06 MED ORDER — HYDROCODONE BIT-HOMATROP MBR 5-1.5 MG/5ML PO SOLN: 5.0000 mL | Freq: Four times a day (QID) | ORAL | 0 refills | Status: AC | PRN

## 2021-08-06 NOTE — Assessment & Plan Note (Signed)
Lab Results  Component Value Date   TSH 1.29 10/02/2020   Stable, pt to continue levothyroxine

## 2021-08-06 NOTE — Patient Instructions (Signed)
Please take all new medication as prescribed - the antibiotic, and cough medicine as needed  Please continue all other medications as before, and refills have been done if requested.  Please have the pharmacy call with any other refills you may need.  Please continue your efforts at being more active, low cholesterol diet, and weight control..  Please keep your appointments with your specialists as you may have planned  Please make an Appointment to return in 6 months, or sooner if needed

## 2021-08-06 NOTE — Assessment & Plan Note (Signed)
Mild to mod, for antibx course,  to f/u any worsening symptoms or concerns 

## 2021-08-06 NOTE — Progress Notes (Signed)
Patient ID: [name], female   DOB: Mar 16, 1971, 50 y.o.   MRN: [id number]        Chief Complaint: follow up URI symptoms, hld, hyperglycemia, hypothyroidism       HPI:  [name] is a 50 y.o. female  Here with 2-3 days acute onset fever, facial pain, pressure, headache, general weakness and malaise, and greenish d/c, with mild ST and cough, but pt denies chest pain, wheezing, increased sob or doe, orthopnea, PND, increased LE swelling, palpitations, dizziness or syncope.   Pt denies polydipsia, polyuria, or new focal neuro s/s.  Trying to follow lower chol diet, has been statin intolerant in the past, and today not interested in zetia or lipid clinic referral.  Denies hyper or hypo thyroid symptoms such as voice, skin or hair change.  No other new complaints       Wt Readings from Last 3 Encounters:  08/06/21 162 lb 3.2 oz (73.6 kg)  10/02/20 179 lb (81.2 kg)  04/17/20 177 lb (80.3 kg)   BP Readings from Last 3 Encounters:  08/06/21 122/80  10/02/20 112/74  04/17/20 110/70         Past Medical History:  Diagnosis Date   Abdominal pain, left lower quadrant 03/26/2008   ABDOMINAL PAIN, SUPRAPUBIC 08/04/2007   ALLERGIC RHINITIS 08/04/2007   Allergy    Anemia    ANXIETY 08/04/2007   Cough    instructed to see primary care if worsens-no fever   DEPRESSION 08/04/2007   FATIGUE 08/04/2007   GERD 08/04/2007   Headache(784.0)    Hyperlipidemia 05/20/2014   Insomnia    SINUSITIS- ACUTE-NOS 07/25/2010   SVD (spontaneous vaginal delivery)    x 4   Thyroid disease    Past Surgical History:  Procedure Laterality Date   ABDOMINAL HYSTERECTOMY  2004   LAPAROSCOPY  03/23/2012   Procedure: LAPAROSCOPY OPERATIVE;  Surgeon: Margarette Asal, MD;  Location: [facility];  Service: Gynecology;  Laterality: N/A;  with CO2 laser ablation of Vin;need microscope,vaginal vault excision of keloid;will want kenolog 40 from pharmacy.   LAPAROTOMY  05/02/2012   Procedure: EXPLORATORY LAPAROTOMY;  Surgeon:  Margarette Asal, MD;  Location: [facility];  Service: Gynecology;  Laterality: N/A;   RHINOPLASTY     nose   SALPINGOOPHORECTOMY  05/02/2012   Procedure: SALPINGO OOPHERECTOMY;  Surgeon: Margarette Asal, MD;  Location: [facility];  Service: Gynecology;  Laterality: Bilateral;  bilateral   SCAR REVISION  03/23/2012   Procedure: SCAR REVISION;  Surgeon: Margarette Asal, MD;  Location: [facility];  Service: Gynecology;  Laterality: N/A;   SCAR REVISION N/A 08/08/2013   Procedure: SCAR REVISION ABDOMINAL;  Surgeon: Margarette Asal, MD;  Location: [facility];  Service: Gynecology;  Laterality: N/A;   TUBAL LIGATION     WISDOM TOOTH EXTRACTION      reports that she has never smoked. She has never used smokeless tobacco. She reports that she does not drink alcohol and does not use drugs. family history includes Asthma in her mother. Allergies  Allergen Reactions   Latex Shortness Of Breath, Swelling and Rash   Codeine Itching   Crestor [Rosuvastatin]    Lipitor [Atorvastatin] Other (See Comments)    myalgia   Methimazole Hives and Itching   Other Itching and Nausea And Vomiting    Pain medication given after surgery; pt doesn't remember name.  Most likely opioid-derived.   Soy Allergy Swelling   Current Outpatient Medications on File Prior to Visit  Medication Sig Dispense Refill   hydrOXYzine (ATARAX/VISTARIL) 25 MG tablet Take 1 tablet (25 mg total) by mouth 3 (three) times daily as needed. 100 tablet 3   levothyroxine (SYNTHROID) 112 MCG tablet TAKE 1 TABLET(112 MCG) BY MOUTH DAILY 90 tablet 3   Loratadine 10 MG CAPS Take by mouth.     traZODone (DESYREL) 50 MG tablet Take 0.5-1 tablets (25-50 mg total) by mouth at bedtime as needed for sleep. 90 tablet 1   triamcinolone cream (KENALOG) 0.1 % Apply 1 application topically 2 (two) times daily. 160 g 3   escitalopram (LEXAPRO) 10 MG tablet Take 1 tablet (10 mg total) by mouth daily. 90 tablet 3   No current facility-administered medications on file  prior to visit.        ROS:  All others reviewed and negative.  Objective        PE:  BP 122/80    Pulse (!) 59    Resp 18    Ht 5\' 7"  (1.702 m)    Wt 162 lb 3.2 oz (73.6 kg)    SpO2 99%    BMI 25.40 kg/m                 Constitutional: Pt appears in NAD               HENT: Head: NCAT.                Right Ear: External ear normal.                 Left Ear: External ear normal.  Bilat tm's with mild erythema.  Max sinus areas mild tender.  Pharynx with mild erythema, no exudate                Eyes: . Pupils are equal, round, and reactive to light. Conjunctivae and EOM are normal               Nose: without d/c or deformity               Neck: Neck supple. Gross normal ROM               Cardiovascular: Normal rate and regular rhythm.                 Pulmonary/Chest: Effort normal and breath sounds without rales or wheezing.                Abd:  Soft, NT, ND, + BS, no organomegaly               Neurological: Pt is alert. At baseline orientation, motor grossly intact               Skin: Skin is warm. No rashes, no other new lesions, LE edema - none               Psychiatric: Pt behavior is normal without agitation   Micro: none  Cardiac tracings I have personally interpreted today:  none  Pertinent Radiological findings (summarize): none   Lab Results  Component Value Date   WBC 5.2 10/02/2020   HGB 13.0 10/02/2020   HCT 38.9 10/02/2020   PLT 253.0 10/02/2020   GLUCOSE 89 10/02/2020   CHOL 229 (H) 10/02/2020   TRIG 76.0 10/02/2020   HDL 43.90 10/02/2020   LDLCALC 170 (H) 10/02/2020   ALT 18 10/02/2020   AST 17 10/02/2020   NA 140 10/02/2020   K 4.1  10/02/2020   CL 104 10/02/2020   CREATININE 0.58 10/02/2020   BUN 14 10/02/2020   CO2 30 10/02/2020   TSH 1.29 10/02/2020   HGBA1C 6.0 10/02/2020   Assessment/Plan:  [name] is a 50 y.o. Asian [4] female with  has a past medical history of Abdominal pain, left lower quadrant (03/26/2008), ABDOMINAL PAIN, SUPRAPUBIC  (08/04/2007), ALLERGIC RHINITIS (08/04/2007), Allergy, Anemia, ANXIETY (08/04/2007), Cough, DEPRESSION (08/04/2007), FATIGUE (08/04/2007), GERD (08/04/2007), Headache(784.0), Hyperlipidemia (05/20/2014), Insomnia, SINUSITIS- ACUTE-NOS (07/25/2010), SVD (spontaneous vaginal delivery), and Thyroid disease.  URI (upper respiratory infection) Mild to mod, for antibx course,  to f/u any worsening symptoms or concerns  Hyperlipidemia Lab Results  Component Value Date   LDLCALC 170 (H) 10/02/2020   Severe uncontrolled, goal ldl < 100, pt to continue current lower chol diet, declines zetia or lipid clinic referral   Hyperglycemia Lab Results  Component Value Date   HGBA1C 6.0 10/02/2020   Stable, pt to continue current medical treatment  - diet   Hypothyroidism Lab Results  Component Value Date   TSH 1.29 10/02/2020   Stable, pt to continue levothyroxine  Followup: Return in about 6 months (around 02/04/2022).  Cathlean Cower, MD 08/06/2021 9:14 PM [facility]

## 2021-08-06 NOTE — Assessment & Plan Note (Signed)
Lab Results  Component Value Date   LDLCALC 170 (H) 10/02/2020   Severe uncontrolled, goal ldl < 100, pt to continue current lower chol diet, declines zetia or lipid clinic referral

## 2021-08-06 NOTE — Assessment & Plan Note (Signed)
Lab Results  Component Value Date   HGBA1C 6.0 10/02/2020   Stable, pt to continue current medical treatment  - diet

## 2021-10-31 ENCOUNTER — Telehealth: Payer: Self-pay | Admitting: *Deleted

## 2021-10-31 NOTE — Telephone Encounter (Signed)
Called and left the patient a message to call the office back. Patient needs to be scheduled with Dr Berline Lopes ?

## 2021-11-03 NOTE — Telephone Encounter (Signed)
Spoke with the patient and scheduled a new patient with Dr Berline Lopes on 3/30 at 10:30 am; patient given an arrival time of 10 am. Patient given the address and phone number for the clinic; along the the policy for mask and visitors  ?

## 2021-11-13 ENCOUNTER — Inpatient Hospital Stay: Payer: 59 | Attending: Gynecologic Oncology | Admitting: Gynecologic Oncology

## 2021-11-13 ENCOUNTER — Encounter: Payer: Self-pay | Admitting: Gynecologic Oncology

## 2021-11-13 ENCOUNTER — Other Ambulatory Visit: Payer: Self-pay

## 2021-11-13 VITALS — BP 123/71 | HR 68 | Temp 98.0°F | Resp 16 | Ht 67.0 in | Wt 165.6 lb

## 2021-11-13 DIAGNOSIS — N952 Postmenopausal atrophic vaginitis: Secondary | ICD-10-CM | POA: Diagnosis not present

## 2021-11-13 DIAGNOSIS — K219 Gastro-esophageal reflux disease without esophagitis: Secondary | ICD-10-CM | POA: Insufficient documentation

## 2021-11-13 DIAGNOSIS — R87621 Atypical squamous cells cannot exclude high grade squamous intraepithelial lesion on cytologic smear of vagina (ASC-H): Secondary | ICD-10-CM | POA: Insufficient documentation

## 2021-11-13 DIAGNOSIS — F32A Depression, unspecified: Secondary | ICD-10-CM | POA: Insufficient documentation

## 2021-11-13 DIAGNOSIS — N893 Dysplasia of vagina, unspecified: Secondary | ICD-10-CM

## 2021-11-13 DIAGNOSIS — F419 Anxiety disorder, unspecified: Secondary | ICD-10-CM | POA: Diagnosis not present

## 2021-11-13 DIAGNOSIS — R102 Pelvic and perineal pain: Secondary | ICD-10-CM | POA: Insufficient documentation

## 2021-11-13 DIAGNOSIS — Z90722 Acquired absence of ovaries, bilateral: Secondary | ICD-10-CM | POA: Diagnosis not present

## 2021-11-13 DIAGNOSIS — Z79899 Other long term (current) drug therapy: Secondary | ICD-10-CM | POA: Diagnosis not present

## 2021-11-13 DIAGNOSIS — E785 Hyperlipidemia, unspecified: Secondary | ICD-10-CM | POA: Diagnosis not present

## 2021-11-13 DIAGNOSIS — Z90710 Acquired absence of both cervix and uterus: Secondary | ICD-10-CM | POA: Diagnosis not present

## 2021-11-13 DIAGNOSIS — R350 Frequency of micturition: Secondary | ICD-10-CM | POA: Insufficient documentation

## 2021-11-13 MED ORDER — ESTRADIOL 0.1 MG/GM VA CREA: 1.0000 | TOPICAL_CREAM | VAGINAL | 12 refills | Status: DC

## 2021-11-13 NOTE — Progress Notes (Signed)
GYNECOLOGIC ONCOLOGY NEW PATIENT CONSULTATION  ? ?Patient Name: [name]  ?Patient Age: 51 y.o. ?Date of Service: 11/13/2021 ?Referring Provider: Molli Posey, MD ? ?Primary Care Provider: Biagio Borg, MD ?Consulting Provider: Jeral Pinch, MD  ? 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  Follow-up biopsy showed low-grade vaginal dysplasia. ? ?Discussed most recent Pap smears as well as vaginal biopsy with the patient.  Given discrepancy between her Pap smear and recent vaginal cuff biopsy, I recommended inspection again today.  On colposcopic exam of the vaginal apex and cuff, I noted 2 areas more concerning for a higher grade dysplastic lesion.  One of these areas was biopsied.  I will plan to call the patient next week when her pathology returns.  We discussed that if high-grade dysplasia is confirmed, that I recommend treatment to decrease the risk of progression to malignancy.  If low-grade dysplasia is again confirmed, then treatment at this time is not necessary.  I would recommend though close surveillance.  Additionally, given her vaginal atrophy as well as vaginal dysplasia, I think the patient would benefit from vaginal estrogen therapy.  We will plan to start this and I sent a prescription to her pharmacy today. ? ?We discussed the role that HPV plays in the pathogenesis of both cervical and vaginal dysplasia. ? ?A copy of this note was sent to the patient's referring provider.  ? ?55 minutes of total time was spent for this patient encounter, including preparation, face-to-face counseling with the patient and coordination of care, and documentation of the encounter. ? ? ?Jeral Pinch, MD  ?Division of Gynecologic Oncology  ?Department of Obstetrics and Gynecology  ?[facility]   ?___________________________________________  ?Chief Complaint: ?Chief Complaint  ?Patient presents with  ? VAIN (vaginal intraepithelial neoplasia)  ? ? ?History of Present Illness:  ?[name] is a 51 y.o. y.o. female who is seen in consultation at the request of Dr. Matthew Saras for an evaluation of vaginal dysplasia. ? ?Patient has a history of vaginal dysplasia treated with laser ablation in 2013.  Paps were subsequently reported to be normal. ?Vaginal Pap performed on 09/23/2020: Negative, high risk HPV detected. ?Vaginal Pap performed on 10/01/2021 shows atypical squamous cells, cannot exclude high-grade squamous intraepithelial lesion.  High risk HPV detected. ?The patient then underwent  vaginoscopy with findings of 2 acetowhite areas at the lateral vaginal cuff that were biopsied.  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 ? ?The patient reports doing well.  She denies any vaginal bleeding or discharge.  We discussed her history of abnormal Pap smears, she is unsure what procedure she had done in 2013 and whether her Pap smears were normal after.  She did well after recent vaginal biopsies. ? ?She has some intermittent right pelvic pain as well as urinary frequency (at baseline).  She endorses regular bowel function. ? ?GYN history is notable for hysterectomy in 2004 for fibroids.  In 04/2012, patient underwent laparotomy with lysis of adhesions and BSO due to pelvic pain and pelvic adhesive disease that had been noted on diagnostic laparoscopy the month prior. ? ?PAST MEDICAL HISTORY:  ?Past Medical History:  ?Diagnosis Date  ? Abdominal pain, left lower quadrant 03/26/2008  ? ABDOMINAL PAIN, SUPRAPUBIC 08/04/2007  ? ALLERGIC RHINITIS 08/04/2007  ? Allergy   ?  Anemia   ? ANXIETY 08/04/2007  ? Cough   ? instructed to see primary care if worsens-no fever  ? DEPRESSION 08/04/2007  ? FATIGUE 08/04/2007  ? GERD 08/04/2007  ? Headache(784.0)   ? Hyperlipidemia 05/20/2014  ?  Insomnia   ? SINUSITIS- ACUTE-NOS 07/25/2010  ? SVD (spontaneous vaginal delivery)   ? x 4  ? Thyroid disease   ?  ? ?PAST SURGICAL HISTORY:  ?Past Surgical History:  ?Procedure Laterality Date  ? ABDOMINAL HYSTERECTOMY  08/17/2002  ? fibroids  ? LAPAROSCOPY  03/23/2012  ? Procedure: LAPAROSCOPY OPERATIVE;  Surgeon: Margarette Asal, MD;  Location: [facility];  Service: Gynecology;  Laterality: N/A;  with CO2 laser ablation of Vin;need microscope,vaginal vault excision of keloid;will want kenolog 40 from pharmacy.  ? LAPAROTOMY  05/02/2012  ? Procedure: EXPLORATORY LAPAROTOMY;  Surgeon: Margarette Asal, MD;  Location: [facility];  Service: Gynecology;  Laterality: N/A;  ? LASER ABLATION  2013  ? vagina  ? RHINOPLASTY    ? nose  ? SALPINGOOPHORECTOMY  05/02/2012  ? Procedure: SALPINGO OOPHERECTOMY;  Surgeon: Margarette Asal, MD;  Location: [facility];  Service: Gynecology;  Laterality: Bilateral;  bilateral  ? SCAR REVISION  03/23/2012  ? Procedure: SCAR REVISION;  Surgeon: Margarette Asal, MD;  Location: [facility];  Service: Gynecology;  Laterality: N/A;  ? SCAR REVISION N/A 08/08/2013  ? Procedure: SCAR REVISION ABDOMINAL;  Surgeon: Margarette Asal, MD;  Location: [facility];  Service: Gynecology;  Laterality: N/A;  ? TUBAL LIGATION    ? WISDOM TOOTH EXTRACTION    ? ? ?OB/GYN HISTORY:  ?OB History  ?Gravida Para Term Preterm AB Living  ?'6 4   4 2 4  '$ ?SAB IAB Ectopic Multiple Live Births  ?           ?  ?# Outcome Date GA Lbr Len/2nd Weight Sex Delivery Anes PTL Lv  ?6 AB           ?5 AB           ?4 Preterm           ?3 Preterm           ?2 Preterm           ?1 Preterm           ? ? ?No LMP recorded. Patient has had a hysterectomy. ? ?Age at menopause: At the time of BSO in 2013. ?Hx of HRT: denies ?Hx of STDs: HPV, remote history of gonorrhea ?Last pap: See HPI ?History of abnormal pap smears: Yes, see HPI ? ?SCREENING STUDIES:  ?Last mammogram: 2023  ?Last colonoscopy: 2020 ? ?MEDICATIONS: ?Outpatient Encounter Medications  as of 11/13/2021  ?Medication Sig  ? estradiol (ESTRACE VAGINAL) 0.1 MG/GM vaginal cream Place 1 Applicatorful vaginally 3 (three) times a week.  ? levothyroxine (SYNTHROID) 112 MCG tablet TAKE 1 TABLET(112 MCG) BY MOUTH DAILY  ? Loratadine 10 MG CAPS Take by mouth.  ? triamcinolone cream (KENALOG) 0.1 % Apply 1 application topically 2 (two) times daily.  ? escitalopram (LEXAPRO) 10 MG tablet Take 1 tablet (10 mg total) by mouth daily.  ? hydrOXYzine (ATARAX/VISTARIL) 25 MG tablet Take 1 tablet (25 mg total) by mouth 3 (three) times daily as needed. (Patient not taking: Reported on 11/13/2021)  ? traZODone (DESYREL) 50 MG tablet Take 0.5-1 tablets (25-50 mg total) by mouth at bedtime as needed for sleep. (Patient not taking: Reported on 11/13/2021)  ? ?No facility-administered encounter  medications on file as of 11/13/2021.  ? ? ?ALLERGIES:  ?Allergies  ?Allergen Reactions  ? Latex Shortness Of Breath, Swelling and Rash  ? Codeine Itching  ? Crestor [Rosuvastatin]   ? Lipitor [Atorvastatin] Other (See Comments)  ?  myalgia  ? Methimazole Hives and Itching  ? Other Itching and Nausea And Vomiting  ?  Pain medication given after surgery; pt doesn't remember name.  Most likely opioid-derived.  ? Soy Allergy Swelling  ?  ? ?FAMILY HISTORY:  ?Family History  ?Problem Relation Age of Onset  ? Asthma Mother   ? Thyroid disease Neg Hx   ? Colon cancer Neg Hx   ? Esophageal cancer Neg Hx   ? Rectal cancer Neg Hx   ? Stomach cancer Neg Hx   ? Breast cancer Neg Hx   ? Ovarian cancer Neg Hx   ? Endometrial cancer Neg Hx   ? Pancreatic cancer Neg Hx   ? Prostate cancer Neg Hx   ?  ? ?SOCIAL HISTORY:  ?Social Connections: Not on file  ? ? ?REVIEW OF SYSTEMS:  ?Pertinent positives include weight loss, joint pain, muscle pain/cramp, itching, numbness, dizziness, anxiety. ?Denies appetite changes, fevers, chills, fatigue. ?Denies hearing loss, neck lumps or masses, mouth sores, ringing in ears or voice changes. ?Denies cough or  wheezing.  Denies shortness of breath. ?Denies chest pain or palpitations. Denies leg swelling. ?Denies abdominal distention, pain, blood in stools, constipation, diarrhea, nausea, vomiting, or early satiety. ?Denies pain with

## 2021-11-13 NOTE — Patient Instructions (Signed)
It was a pleasure meeting you today. ? ?We discussed your recent biopsies which show low-grade precancer of the vagina.  Given the low risk of this progressing to cancer, we most frequently do not treat this but watch it carefully.  Because of your symptoms as well as your exam that shows menopausal changes, I would recommend that we start vaginal estrogen which can treat the changes of the vagina that happen after menopause and sometimes can help resolve low-grade precancerous changes. ? ?On your exam today, there were 2 areas along the right side of the top of your vagina that look more concerning to me.  I biopsied 1 of these.  I will call you next week with the results.  If they confirm low-grade precancer, then we will plan to have you follow-up with Dr. Matthew Saras and use the vaginal estrogen.  If the biopsy shows high-grade precancer, then we will have a conversation about treatment options when I call you. ?

## 2021-11-14 DIAGNOSIS — N952 Postmenopausal atrophic vaginitis: Secondary | ICD-10-CM | POA: Insufficient documentation

## 2021-11-14 DIAGNOSIS — R87621 Atypical squamous cells cannot exclude high grade squamous intraepithelial lesion on cytologic smear of vagina (ASC-H): Secondary | ICD-10-CM | POA: Insufficient documentation

## 2021-11-14 DIAGNOSIS — N893 Dysplasia of vagina, unspecified: Secondary | ICD-10-CM | POA: Insufficient documentation

## 2021-11-18 ENCOUNTER — Telehealth: Payer: Self-pay

## 2021-11-18 ENCOUNTER — Telehealth: Payer: Self-pay | Admitting: Gynecologic Oncology

## 2021-11-18 LAB — SURGICAL PATHOLOGY

## 2021-11-18 NOTE — Telephone Encounter (Signed)
Called patient to discuss results.  No answer, left message requesting callback.  Vaginal biopsy that I took showed atrophy, no dysplasia or malignancy.  I am going to recommend vaginal estrogen use for 6 months followed by repeat exam in 6 months with her OB/GYN. ? ?Jeral Pinch MD ?Gynecologic Oncology ? ?

## 2021-11-18 NOTE — Telephone Encounter (Signed)
Received call from [name] this afternoon, she missed a call from [name]. ?Per [name] vaginal biopsy showed atrophy, no dysplasia or malignancy. She recommends vaginal estrogen use for 6 months followed by repeat exam in 6 months with her OB/GYN. Patient verbalized understanding and will setup an appointment with [name] in 6 months. Instructed to call with any questions or concerns.  ?

## 2021-12-25 ENCOUNTER — Ambulatory Visit: Payer: 59 | Admitting: Internal Medicine

## 2021-12-25 ENCOUNTER — Encounter: Payer: Self-pay | Admitting: Internal Medicine

## 2021-12-25 VITALS — BP 128/80 | HR 79 | Temp 98.3°F | Ht 67.0 in | Wt 169.0 lb

## 2021-12-25 DIAGNOSIS — Z0001 Encounter for general adult medical examination with abnormal findings: Secondary | ICD-10-CM

## 2021-12-25 DIAGNOSIS — R739 Hyperglycemia, unspecified: Secondary | ICD-10-CM | POA: Diagnosis not present

## 2021-12-25 DIAGNOSIS — E538 Deficiency of other specified B group vitamins: Secondary | ICD-10-CM | POA: Diagnosis not present

## 2021-12-25 DIAGNOSIS — E890 Postprocedural hypothyroidism: Secondary | ICD-10-CM

## 2021-12-25 DIAGNOSIS — F411 Generalized anxiety disorder: Secondary | ICD-10-CM | POA: Diagnosis not present

## 2021-12-25 DIAGNOSIS — Z86010 Personal history of colonic polyps: Secondary | ICD-10-CM

## 2021-12-25 DIAGNOSIS — F32A Depression, unspecified: Secondary | ICD-10-CM

## 2021-12-25 DIAGNOSIS — E7800 Pure hypercholesterolemia, unspecified: Secondary | ICD-10-CM | POA: Diagnosis not present

## 2021-12-25 DIAGNOSIS — E559 Vitamin D deficiency, unspecified: Secondary | ICD-10-CM | POA: Diagnosis not present

## 2021-12-25 LAB — URINALYSIS, ROUTINE W REFLEX MICROSCOPIC
Bilirubin Urine: NEGATIVE
Ketones, ur: NEGATIVE
Leukocytes,Ua: NEGATIVE
Nitrite: NEGATIVE
RBC / HPF: NONE SEEN (ref 0–?)
Specific Gravity, Urine: 1.025 (ref 1.000–1.030)
Total Protein, Urine: NEGATIVE
Urine Glucose: NEGATIVE
Urobilinogen, UA: 0.2 (ref 0.0–1.0)
pH: 6 (ref 5.0–8.0)

## 2021-12-25 LAB — BASIC METABOLIC PANEL
BUN: 14 mg/dL (ref 6–23)
CO2: 30 mEq/L (ref 19–32)
Calcium: 9.8 mg/dL (ref 8.4–10.5)
Chloride: 103 mEq/L (ref 96–112)
Creatinine, Ser: 0.56 mg/dL (ref 0.40–1.20)
GFR: 104.49 mL/min (ref >60.00)
Glucose, Bld: 85 mg/dL (ref 70–99)
Potassium: 3.7 mEq/L (ref 3.5–5.1)
Sodium: 140 mEq/L (ref 135–145)

## 2021-12-25 LAB — VITAMIN D 25 HYDROXY (VIT D DEFICIENCY, FRACTURES): VITD: 42.15 ng/mL (ref 30.00–100.00)

## 2021-12-25 LAB — HEMOGLOBIN A1C: Hgb A1c MFr Bld: 6.1 % (ref 4.6–6.5)

## 2021-12-25 LAB — HEPATIC FUNCTION PANEL
ALT: 24 U/L (ref 0–35)
AST: 16 U/L (ref 0–37)
Albumin: 4.5 g/dL (ref 3.5–5.2)
Alkaline Phosphatase: 87 U/L (ref 39–117)
Bilirubin, Direct: 0 mg/dL (ref 0.0–0.3)
Total Bilirubin: 0.4 mg/dL (ref 0.2–1.2)
Total Protein: 7.6 g/dL (ref 6.0–8.3)

## 2021-12-25 LAB — LIPID PANEL
Cholesterol: 208 mg/dL — ABNORMAL HIGH (ref 0–200)
HDL: 35.7 mg/dL — ABNORMAL LOW (ref >39.00)
NonHDL: 172.58
Total CHOL/HDL Ratio: 6
Triglycerides: 220 mg/dL — ABNORMAL HIGH (ref 0.0–149.0)
VLDL: 44 mg/dL — ABNORMAL HIGH (ref 0.0–40.0)

## 2021-12-25 LAB — CBC WITH DIFFERENTIAL/PLATELET
Basophils Absolute: 0 10*3/uL (ref 0.0–0.1)
Basophils Relative: 0.5 % (ref 0.0–3.0)
Eosinophils Absolute: 0.1 10*3/uL (ref 0.0–0.7)
Eosinophils Relative: 1.4 % (ref 0.0–5.0)
HCT: 38.8 % (ref 36.0–46.0)
Hemoglobin: 12.8 g/dL (ref 12.0–15.0)
Lymphocytes Relative: 28.1 % (ref 12.0–46.0)
Lymphs Abs: 2.5 10*3/uL (ref 0.7–4.0)
MCHC: 33.1 g/dL (ref 30.0–36.0)
MCV: 87.4 fl (ref 78.0–100.0)
Monocytes Absolute: 0.5 10*3/uL (ref 0.1–1.0)
Monocytes Relative: 5.3 % (ref 3.0–12.0)
Neutro Abs: 5.9 10*3/uL (ref 1.4–7.7)
Neutrophils Relative %: 64.7 % (ref 43.0–77.0)
Platelets: 266 10*3/uL (ref 150.0–400.0)
RBC: 4.44 Mil/uL (ref 3.87–5.11)
RDW: 13.9 % (ref 11.5–15.5)
WBC: 9 10*3/uL (ref 4.0–10.5)

## 2021-12-25 LAB — VITAMIN B12: Vitamin B-12: 668 pg/mL (ref 211–911)

## 2021-12-25 LAB — LDL CHOLESTEROL, DIRECT: Direct LDL: 153 mg/dL

## 2021-12-25 LAB — TSH: TSH: 1.31 u[IU]/mL (ref 0.35–5.50)

## 2021-12-25 LAB — T4, FREE: Free T4: 1 ng/dL (ref 0.60–1.60)

## 2021-12-25 MED ORDER — CITALOPRAM HYDROBROMIDE 20 MG PO TABS: 20.0000 mg | ORAL_TABLET | Freq: Every day | ORAL | 3 refills | Status: DC

## 2021-12-25 NOTE — Patient Instructions (Addendum)
You will be contacted regarding the referral for: colonoscopy ? ?Please take all new medication as prescribed - the celexa 20 mg for low mood and stress ? ?Please continue all other medications as before, and refills have been done if requested. ? ?Please have the pharmacy call with any other refills you may need. ? ?Please continue your efforts at being more active, low cholesterol diet, and weight control. ? ?You are otherwise up to date with prevention measures today. ? ?Please keep your appointments with your specialists as you may have planned ? ?Please go to the LAB at the blood drawing area for the tests to be done ? ?You will be contacted by phone if any changes need to be made immediately.  Otherwise, you will receive a letter about your results with an explanation, but please check with MyChart first. ? ?Please remember to sign up for MyChart if you have not done so, as this will be important to you in the future with finding out test results, communicating by private email, and scheduling acute appointments online when needed. ? ?Please make an Appointment to return for your 1 year visit, or sooner if needed ? ?

## 2021-12-25 NOTE — Assessment & Plan Note (Signed)
Last vitamin D ?Lab Results  ?Component Value Date  ? VD25OH 36.94 10/02/2020  ? ?Low, to start oral replacement ? ?

## 2021-12-25 NOTE — Progress Notes (Signed)
Patient ID: [name], female   DOB: March 26, 1971, 51 y.o.   MRN: [id number] ? ? ? ?     Chief Complaint:: wellness exam and hld, low vit d, anxiety depression ? ?     HPI:  [name] is a 51 y.o. female here for wellness exam; declines shingrix, but for colonoscopy with hx of polyps, o/w up to date ?         ?              Also not taking Vit D.  Trying to follow lower chol diet.  Did not go to lipid clinic due to difficult finances.   Has also had mild worsening depressive symptoms, but no suicidal ideation, or panic; has ongoing anxiety, increased with stressors recently, mostly financial due to less work during covid.  Pt denies chest pain, increased sob or doe, wheezing, orthopnea, PND, increased LE swelling, palpitations, dizziness or syncope.   Pt denies polydipsia, polyuria, or new focal neuro s/s.    Pt denies fever, wt loss, night sweats, loss of appetite, or other constitutional symptoms   ?  ?Wt Readings from Last 3 Encounters:  ?12/25/21 169 lb (76.7 kg)  ?11/13/21 165 lb 9.6 oz (75.1 kg)  ?08/06/21 162 lb 3.2 oz (73.6 kg)  ? ?BP Readings from Last 3 Encounters:  ?12/25/21 128/80  ?11/13/21 123/71  ?08/06/21 122/80  ? ?Immunization History  ?Administered Date(s) Administered  ? Influenza Split 05/03/2012  ? Influenza Whole 05/23/2008  ? Influenza,inj,Quad PF,6+ Mos 05/18/2014, 06/12/2015, 06/12/2016, 10/27/2017  ? PFIZER(Purple Top)SARS-COV-2 Vaccination 02/15/2020, 03/06/2020, 08/12/2020  ? Tdap 03/29/2013  ? ?There are no preventive care reminders to display for this patient. ? ?  ? ?Past Medical History:  ?Diagnosis Date  ? Abdominal pain, left lower quadrant 03/26/2008  ? ABDOMINAL PAIN, SUPRAPUBIC 08/04/2007  ? ALLERGIC RHINITIS 08/04/2007  ? Allergy   ? Anemia   ? ANXIETY 08/04/2007  ? Cough   ? instructed to see primary care if worsens-no fever  ? DEPRESSION 08/04/2007  ? FATIGUE 08/04/2007  ? GERD 08/04/2007  ? Headache(784.0)   ? Hyperlipidemia 05/20/2014  ? Insomnia   ? SINUSITIS- ACUTE-NOS  07/25/2010  ? SVD (spontaneous vaginal delivery)   ? x 4  ? Thyroid disease   ? ?Past Surgical History:  ?Procedure Laterality Date  ? ABDOMINAL HYSTERECTOMY  08/17/2002  ? fibroids  ? LAPAROSCOPY  03/23/2012  ? Procedure: LAPAROSCOPY OPERATIVE;  Surgeon: Margarette Asal, MD;  Location: [facility];  Service: Gynecology;  Laterality: N/A;  with CO2 laser ablation of Vin;need microscope,vaginal vault excision of keloid;will want kenolog 40 from pharmacy.  ? LAPAROTOMY  05/02/2012  ? Procedure: EXPLORATORY LAPAROTOMY;  Surgeon: Margarette Asal, MD;  Location: [facility];  Service: Gynecology;  Laterality: N/A;  ? LASER ABLATION  2013  ? vagina  ? RHINOPLASTY    ? nose  ? SALPINGOOPHORECTOMY  05/02/2012  ? Procedure: SALPINGO OOPHERECTOMY;  Surgeon: Margarette Asal, MD;  Location: [facility];  Service: Gynecology;  Laterality: Bilateral;  bilateral  ? SCAR REVISION  03/23/2012  ? Procedure: SCAR REVISION;  Surgeon: Margarette Asal, MD;  Location: [facility];  Service: Gynecology;  Laterality: N/A;  ? SCAR REVISION N/A 08/08/2013  ? Procedure: SCAR REVISION ABDOMINAL;  Surgeon: Margarette Asal, MD;  Location: [facility];  Service: Gynecology;  Laterality: N/A;  ? TUBAL LIGATION    ? WISDOM TOOTH EXTRACTION    ? ? reports that she has  never smoked. She has never used smokeless tobacco. She reports that she does not drink alcohol and does not use drugs. ?family history includes Asthma in her mother. ?Allergies  ?Allergen Reactions  ? Latex Shortness Of Breath, Swelling and Rash  ? Codeine Itching  ? Crestor [Rosuvastatin]   ? Lipitor [Atorvastatin] Other (See Comments)  ?  myalgia  ? Methimazole Hives and Itching  ? Other Itching and Nausea And Vomiting  ?  Pain medication given after surgery; pt doesn't remember name.  Most likely opioid-derived.  ? Soy Allergy Swelling  ? ?Current Outpatient Medications on File Prior to Visit  ?Medication Sig Dispense Refill  ? estradiol (ESTRACE VAGINAL) 0.1 MG/GM vaginal cream Place 1  Applicatorful vaginally 3 (three) times a week. 42.5 g 12  ? levothyroxine (SYNTHROID) 112 MCG tablet TAKE 1 TABLET(112 MCG) BY MOUTH DAILY 90 tablet 3  ? Loratadine 10 MG CAPS Take by mouth.    ? traZODone (DESYREL) 50 MG tablet Take 0.5-1 tablets (25-50 mg total) by mouth at bedtime as needed for sleep. 90 tablet 1  ? triamcinolone cream (KENALOG) 0.1 % Apply 1 application topically 2 (two) times daily. 160 g 3  ? escitalopram (LEXAPRO) 10 MG tablet Take 1 tablet (10 mg total) by mouth daily. 90 tablet 3  ? hydrOXYzine (ATARAX/VISTARIL) 25 MG tablet Take 1 tablet (25 mg total) by mouth 3 (three) times daily as needed. (Patient not taking: Reported on 11/13/2021) 100 tablet 3  ? ?No current facility-administered medications on file prior to visit.  ? ?     ROS:  All others reviewed and negative. ? ?Objective  ? ?     PE:  BP 128/80 (BP Location: Right Arm, Patient Position: Sitting, Cuff Size: Normal)   Pulse 79   Temp 98.3 ?F (36.8 ?C) (Oral)   Ht '5\' 7"'$  (1.702 m)   Wt 169 lb (76.7 kg)   SpO2 95%   BMI 26.47 kg/m?  ? ?              Constitutional: Pt appears in NAD ?              HENT: Head: NCAT.  ?              Right Ear: External ear normal.   ?              Left Ear: External ear normal.  ?              Eyes: . Pupils are equal, round, and reactive to light. Conjunctivae and EOM are normal ?              Nose: without d/c or deformity ?              Neck: Neck supple. Gross normal ROM ?              Cardiovascular: Normal rate and regular rhythm.   ?              Pulmonary/Chest: Effort normal and breath sounds without rales or wheezing.  ?              Abd:  Soft, NT, ND, + BS, no organomegaly ?              Neurological: Pt is alert. At baseline orientation, motor grossly intact ?              Skin: Skin is warm. No rashes, no other new  lesions, LE edema - none ?              Psychiatric: Pt behavior is normal without agitation , anxious depressed affect ? ?Micro: none ? ?Cardiac tracings I have  personally interpreted today:  none ? ?Pertinent Radiological findings (summarize): none  ? ?Lab Results  ?Component Value Date  ? WBC 9.0 12/25/2021  ? HGB 12.8 12/25/2021  ? HCT 38.8 12/25/2021  ? PLT 266.0 12/25/2021  ? GLUCOSE 85 12/25/2021  ? CHOL 208 (H) 12/25/2021  ? TRIG 220.0 (H) 12/25/2021  ? HDL 35.70 (L) 12/25/2021  ? LDLDIRECT 153.0 12/25/2021  ? LDLCALC 170 (H) 10/02/2020  ? ALT 24 12/25/2021  ? AST 16 12/25/2021  ? NA 140 12/25/2021  ? K 3.7 12/25/2021  ? CL 103 12/25/2021  ? CREATININE 0.56 12/25/2021  ? BUN 14 12/25/2021  ? CO2 30 12/25/2021  ? TSH 1.31 12/25/2021  ? HGBA1C 6.1 12/25/2021  ? ?Assessment/Plan:  ?[name] is a 51 y.o. Asian [4] female with  has a past medical history of Abdominal pain, left lower quadrant (03/26/2008), ABDOMINAL PAIN, SUPRAPUBIC (08/04/2007), ALLERGIC RHINITIS (08/04/2007), Allergy, Anemia, ANXIETY (08/04/2007), Cough, DEPRESSION (08/04/2007), FATIGUE (08/04/2007), GERD (08/04/2007), Headache(784.0), Hyperlipidemia (05/20/2014), Insomnia, SINUSITIS- ACUTE-NOS (07/25/2010), SVD (spontaneous vaginal delivery), and Thyroid disease. ? ?Vitamin D deficiency ?Last vitamin D ?Lab Results  ?Component Value Date  ? VD25OH 36.94 10/02/2020  ? ?Low, to start oral replacement ? ? ?Encounter for well adult exam with abnormal findings ?Age and sex appropriate education and counseling updated with regular exercise and diet ?Referrals for preventative services - for colonoscopy ?Immunizations addressed - declines shingrix ?Smoking counseling  - none needed ?Evidence for depression or other mood disorder - none significant ?Most recent labs reviewed. ?I have personally reviewed and have noted: ?1) the patient's medical and social history ?2) The patient's current medications and supplements ?3) The patient's height, weight, and BMI have been recorded in the chart ? ? ?Anxiety state ?With increased stressors recently, for celexa 20 qd ? ?Depression ?Also mild worsening, for celexa 20  qd, declines referral counseling ? ?Hyperglycemia ?Lab Results  ?Component Value Date  ? HGBA1C 6.1 12/25/2021  ? ?Stable, pt to continue current medical treatment  - diet ? ? ?Hyperlipidemia ?Lab Results  ?Component Value Date  ? LDL

## 2021-12-26 ENCOUNTER — Encounter: Payer: Self-pay | Admitting: Internal Medicine

## 2021-12-28 ENCOUNTER — Encounter: Payer: Self-pay | Admitting: Internal Medicine

## 2021-12-28 NOTE — Assessment & Plan Note (Signed)
With increased stressors recently, for celexa 20 qd ?

## 2021-12-28 NOTE — Assessment & Plan Note (Signed)
Lab Results  ?Component Value Date  ? TSH 1.31 12/25/2021  ? ?Stable, pt to continue levothyroxine ? ?

## 2021-12-28 NOTE — Assessment & Plan Note (Signed)
Also mild worsening, for celexa 20 qd, declines referral counseling ?

## 2021-12-28 NOTE — Assessment & Plan Note (Signed)
Lab Results  ?Component Value Date  ? LDLCALC 170 (H) 10/02/2020  ? ?Stable, pt to continue current low chol diet, has been statin intolerant, declines lipid clinic due to finances ? ?

## 2021-12-28 NOTE — Assessment & Plan Note (Addendum)
Age and sex appropriate education and counseling updated with regular exercise and diet Referrals for preventative services - for colonoscopy Immunizations addressed - declines shingrix Smoking counseling  - none needed Evidence for depression or other mood disorder - none significant Most recent labs reviewed. I have personally reviewed and have noted: 1) the patient's medical and social history 2) The patient's current medications and supplements 3) The patient's height, weight, and BMI have been recorded in the chart\ 

## 2021-12-28 NOTE — Assessment & Plan Note (Signed)
Lab Results  ?Component Value Date  ? HGBA1C 6.1 12/25/2021  ? ?Stable, pt to continue current medical treatment  - diet ? ?

## 2022-01-15 ENCOUNTER — Encounter: Payer: Self-pay | Admitting: Gastroenterology

## 2022-02-10 ENCOUNTER — Encounter: Payer: Self-pay | Admitting: Gastroenterology

## 2022-02-16 ENCOUNTER — Ambulatory Visit (AMBULATORY_SURGERY_CENTER): Payer: Self-pay | Admitting: *Deleted

## 2022-02-16 VITALS — Ht 67.0 in | Wt 158.0 lb

## 2022-02-16 DIAGNOSIS — Z86010 Personal history of colonic polyps: Secondary | ICD-10-CM

## 2022-02-16 MED ORDER — NA SULFATE-K SULFATE-MG SULF 17.5-3.13-1.6 GM/177ML PO SOLN: 1.0000 | Freq: Once | ORAL | 0 refills | Status: AC

## 2022-02-16 NOTE — Progress Notes (Signed)
No egg or soy allergy known to patient   issues known to pt with past sedation with any surgeries or procedures of PONV  Patient denies ever being told they had issues or difficulty with intubation  No FH of Malignant Hyperthermia Pt is not on diet pills Pt is not on  home 02  Pt is not on blood thinners  Pt denies issues with constipation  No A fib or A flutter Have any cardiac testing pending--pt denies     NO PA's for preps discussed with pt In PV today  Discussed with pt there will be an out-of-pocket cost for prep and that varies from $0 to 70 +  dollars - pt verbalized understanding  Pt instructed to use Singlecare.com or GoodRx for a price reduction on prep

## 2022-03-04 ENCOUNTER — Encounter: Payer: Self-pay | Admitting: Gastroenterology

## 2022-03-09 ENCOUNTER — Ambulatory Visit (AMBULATORY_SURGERY_CENTER): Payer: 59 | Admitting: Gastroenterology

## 2022-03-09 ENCOUNTER — Encounter: Payer: Self-pay | Admitting: Gastroenterology

## 2022-03-09 VITALS — BP 103/64 | HR 53 | Temp 98.4°F | Resp 12 | Ht 67.0 in | Wt 158.0 lb

## 2022-03-09 DIAGNOSIS — Z09 Encounter for follow-up examination after completed treatment for conditions other than malignant neoplasm: Secondary | ICD-10-CM

## 2022-03-09 DIAGNOSIS — D120 Benign neoplasm of cecum: Secondary | ICD-10-CM

## 2022-03-09 DIAGNOSIS — Z86010 Personal history of colonic polyps: Secondary | ICD-10-CM | POA: Diagnosis not present

## 2022-03-09 MED ORDER — SODIUM CHLORIDE 0.9 % IV SOLN: 500.0000 mL | Freq: Once | INTRAVENOUS | Status: DC

## 2022-03-09 NOTE — Progress Notes (Signed)
Called to room to assist during endoscopic procedure.  Patient ID and intended procedure confirmed with present staff. Received instructions for my participation in the procedure from the performing physician.  

## 2022-03-09 NOTE — Progress Notes (Signed)
Pt non-responsive, VVS, Report to RN  °

## 2022-03-09 NOTE — Progress Notes (Signed)
Pt's states no medical or surgical changes since previsit or office visit. 

## 2022-03-09 NOTE — Progress Notes (Signed)
[facility] History and Physical   Primary Care Physician:  Biagio Borg, MD   Reason for Procedure:   History of colon polyps  Plan:    colonoscopy     HPI: [name] is a 51 y.o. female  here for colonoscopy surveillance - multiple polyps removed 12/2018. Patient denies any bowel symptoms at this time. No family history of colon cancer known. Otherwise feels well without any cardiopulmonary symptoms.  I have discussed risks / benefits of colonoscopy and anesthesia with her and she wishes to proceed.   Past Medical History:  Diagnosis Date   Abdominal pain, left lower quadrant 03/26/2008   ABDOMINAL PAIN, SUPRAPUBIC 08/04/2007   ALLERGIC RHINITIS 08/04/2007   Allergy    Anemia    ANXIETY 08/04/2007   Arthritis    mild   Cough    instructed to see primary care if worsens-no fever   DEPRESSION 08/04/2007   FATIGUE 08/04/2007   GERD 08/04/2007   Headache(784.0)    Hyperlipidemia 05/20/2014   Insomnia    PONV (postoperative nausea and vomiting)    SINUSITIS- ACUTE-NOS 07/25/2010   SVD (spontaneous vaginal delivery)    x 4   Thyroid disease     Past Surgical History:  Procedure Laterality Date   ABDOMINAL HYSTERECTOMY  08/17/2002   fibroids   COLONOSCOPY     LAPAROSCOPY  03/23/2012   Procedure: LAPAROSCOPY OPERATIVE;  Surgeon: Margarette Asal, MD;  Location: [facility];  Service: Gynecology;  Laterality: N/A;  with CO2 laser ablation of Vin;need microscope,vaginal vault excision of keloid;will want kenolog 40 from pharmacy.   LAPAROTOMY  05/02/2012   Procedure: EXPLORATORY LAPAROTOMY;  Surgeon: Margarette Asal, MD;  Location: [facility];  Service: Gynecology;  Laterality: N/A;   LASER ABLATION  2013   vagina   POLYPECTOMY     RHINOPLASTY     nose   SALPINGOOPHORECTOMY  05/02/2012   Procedure: SALPINGO OOPHERECTOMY;  Surgeon: Margarette Asal, MD;  Location: [facility];  Service: Gynecology;  Laterality: Bilateral;  bilateral   SCAR REVISION  03/23/2012    Procedure: SCAR REVISION;  Surgeon: Margarette Asal, MD;  Location: [facility];  Service: Gynecology;  Laterality: N/A;   SCAR REVISION N/A 08/08/2013   Procedure: SCAR REVISION ABDOMINAL;  Surgeon: Margarette Asal, MD;  Location: [facility];  Service: Gynecology;  Laterality: N/A;   TUBAL LIGATION     WISDOM TOOTH EXTRACTION      Prior to Admission medications   Medication Sig Start Date End Date Taking? Authorizing Provider  Cholecalciferol (VITAMIN D3 PO) Take by mouth.   Yes [provider]  citalopram (CELEXA) 20 MG tablet Take 1 tablet (20 mg total) by mouth daily. 12/25/21  Yes Biagio Borg, MD  Cyanocobalamin (VITAMIN B 12 PO) Take by mouth.   Yes [provider]  diphenhydrAMINE (BENADRYL) 25 MG tablet Take 25 mg by mouth every 6 (six) hours as needed.   Yes [provider]  estradiol (ESTRACE VAGINAL) 0.1 MG/GM vaginal cream Place 1 Applicatorful vaginally 3 (three) times a week. 11/14/21  Yes Lafonda Mosses, MD  hydrOXYzine (ATARAX/VISTARIL) 25 MG tablet Take 1 tablet (25 mg total) by mouth 3 (three) times daily as needed. 09/20/19  Yes Biagio Borg, MD  levothyroxine (SYNTHROID) 112 MCG tablet TAKE 1 TABLET(112 MCG) BY MOUTH DAILY 10/02/20  Yes Biagio Borg, MD  Loratadine 10 MG CAPS Take by mouth.   Yes [provider]  traZODone (DESYREL)  50 MG tablet Take 0.5-1 tablets (25-50 mg total) by mouth at bedtime as needed for sleep. 10/02/20  Yes Biagio Borg, MD  triamcinolone cream (KENALOG) 0.1 % Apply 1 application topically 2 (two) times daily. 12/02/18   Renato Shin, MD    Current Outpatient Medications  Medication Sig Dispense Refill   Cholecalciferol (VITAMIN D3 PO) Take by mouth.     citalopram (CELEXA) 20 MG tablet Take 1 tablet (20 mg total) by mouth daily. 90 tablet 3   Cyanocobalamin (VITAMIN B 12 PO) Take by mouth.     diphenhydrAMINE (BENADRYL) 25 MG tablet Take 25 mg by mouth every 6 (six) hours as needed.     estradiol (ESTRACE  VAGINAL) 0.1 MG/GM vaginal cream Place 1 Applicatorful vaginally 3 (three) times a week. 42.5 g 12   hydrOXYzine (ATARAX/VISTARIL) 25 MG tablet Take 1 tablet (25 mg total) by mouth 3 (three) times daily as needed. 100 tablet 3   levothyroxine (SYNTHROID) 112 MCG tablet TAKE 1 TABLET(112 MCG) BY MOUTH DAILY 90 tablet 3   Loratadine 10 MG CAPS Take by mouth.     traZODone (DESYREL) 50 MG tablet Take 0.5-1 tablets (25-50 mg total) by mouth at bedtime as needed for sleep. 90 tablet 1   triamcinolone cream (KENALOG) 0.1 % Apply 1 application topically 2 (two) times daily. 160 g 3   Current Facility-Administered Medications  Medication Dose Route Frequency Provider Last Rate Last Admin   0.9 %  sodium chloride infusion  500 mL Intravenous Once Talin Feister, Carlota Raspberry, MD        Allergies as of 03/09/2022 - Review Complete 03/09/2022  Allergen Reaction Noted   Latex Shortness Of Breath, Swelling, and Rash 03/20/2012   Codeine Itching 10/02/2020   Crestor [rosuvastatin]  10/02/2020   Lipitor [atorvastatin] Other (See Comments) 04/17/2020   Methimazole Hives and Itching 10/31/2018   Other Itching and Nausea And Vomiting 03/20/2012   Soy allergy Swelling 12/20/2018    Family History  Problem Relation Age of Onset   Asthma Mother    Thyroid disease Neg Hx    Colon cancer Neg Hx    Esophageal cancer Neg Hx    Rectal cancer Neg Hx    Stomach cancer Neg Hx    Breast cancer Neg Hx    Ovarian cancer Neg Hx    Endometrial cancer Neg Hx    Pancreatic cancer Neg Hx    Prostate cancer Neg Hx    Colon polyps Neg Hx     Social History   Socioeconomic History   Marital status: Divorced    Spouse name: Not on file   Number of children: Not on file   Years of education: Not on file   Highest education level: Not on file  Occupational History   Occupation: Glass blower/designer    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)  Tobacco Use   Smoking status: Never   Smokeless tobacco: Never  Vaping Use    Vaping Use: Never used  Substance and Sexual Activity   Alcohol use: No   Drug use: No   Sexual activity: Yes    Birth control/protection: Surgical  Other Topics Concern   Not on file  Social History Narrative   Not on file   Social Determinants of Health   Financial Resource Strain: Not on file  Food Insecurity: Not on file  Transportation Needs: Not on file  Physical Activity: Not on file  Stress: Not on file  Social Connections: Not on file  Intimate Partner Violence: Not on file    Review of Systems: All other review of systems negative except as mentioned in the HPI.  Physical Exam: Vital signs BP (!) 105/59   Pulse (!) 58   Temp 98.4 F (36.9 C)   Ht '5\' 7"'$  (1.702 m)   Wt 158 lb (71.7 kg)   SpO2 97%   BMI 24.75 kg/m   General:   Alert,  Well-developed, pleasant and cooperative in NAD Lungs:  Clear throughout to auscultation.   Heart:  Regular rate and rhythm Abdomen:  Soft, nontender and nondistended.   Neuro/Psych:  Alert and cooperative. Normal mood and affect. A and O x 3  Jolly Mango, MD [facility]

## 2022-03-09 NOTE — Op Note (Signed)
[facility] Patient Name: [name] Procedure Date: 03/09/2022 11:47 AM MRN: [id number] Endoscopist: Remo Lipps P. Havery Moros , MD Age: 51 Referring MD:  Date of Birth: 12-31-1970 Gender: Female Account #: [id number] Procedure:                Colonoscopy Indications:              High risk colon cancer surveillance: Personal                            history of colonic polyps - 12/2018 - 5 polyps, most                            adenomas / SSPs Medicines:                Monitored Anesthesia Care Procedure:                Pre-Anesthesia Assessment:                           - Prior to the procedure, a History and Physical                            was performed, and patient medications and                            allergies were reviewed. The patient's tolerance of                            previous anesthesia was also reviewed. The risks                            and benefits of the procedure and the sedation                            options and risks were discussed with the patient.                            All questions were answered, and informed consent                            was obtained. Prior Anticoagulants: The patient has                            taken no previous anticoagulant or antiplatelet                            agents. ASA Grade Assessment: II - A patient with                            mild systemic disease. After reviewing the risks                            and benefits, the patient was deemed in  satisfactory condition to undergo the procedure.                           After obtaining informed consent, the colonoscope                            was passed under direct vision. Throughout the                            procedure, the patient's blood pressure, pulse, and                            oxygen saturations were monitored continuously. The                            Olympus PCF-H190DL (#[id number]) Colonoscope was                             introduced through the anus and advanced to the the                            cecum, identified by appendiceal orifice and                            ileocecal valve. The colonoscopy was performed                            without difficulty. The patient tolerated the                            procedure well. The quality of the bowel                            preparation was adequate. The ileocecal valve,                            appendiceal orifice, and rectum were photographed. Scope In: 11:50:49 AM Scope Out: 12:14:47 PM Scope Withdrawal Time: 0 hours 19 minutes 55 seconds  Total Procedure Duration: 0 hours 23 minutes 58 seconds  Findings:                 The perianal and digital rectal examinations were                            normal.                           A few small-mouthed diverticula were found in the                            right colon.                           Two sessile polyps were found in the cecum. The  polyps were diminutive in size. These polyps were                            removed with a cold snare. Resection and retrieval                            were complete.                           An area of altered mucosa was found in the sigmoid                            colon which was scarred likely from prior infection                            / inflammation. This was noted on the last exam                            without appreciable changes. Biopsied on the last                            exam and unremarkable, no biopsies taken on this                            exam.                           The exam was otherwise without abnormality. Complications:            No immediate complications. Estimated blood loss:                            Minimal. Estimated Blood Loss:     Estimated blood loss was minimal. Impression:               - Diverticulosis in the right colon.                            - Two diminutive polyps in the cecum, removed with                            a cold snare. Resected and retrieved.                           - Altered mucosa in the sigmoid colon likely post                            inflammatory - no active inflammation, no biopsies                            done as outlined above.                           - The examination was otherwise normal. Recommendation:           -  Patient has a contact number available for                            emergencies. The signs and symptoms of potential                            delayed complications were discussed with the                            patient. Return to normal activities tomorrow.                            Written discharge instructions were provided to the                            patient.                           - Resume previous diet.                           - Continue present medications.                           - Await pathology results. Remo Lipps P. Anna Beaird, MD 03/09/2022 12:20:31 PM This report has been signed electronically.

## 2022-03-09 NOTE — Patient Instructions (Signed)
YOU HAD AN ENDOSCOPIC PROCEDURE TODAY AT [facility]:   Refer to the procedure report that was given to you for any specific questions about what was found during the examination.  If the procedure report does not answer your questions, please call your gastroenterologist to clarify.  If you requested that your care partner not be given the details of your procedure findings, then the procedure report has been included in a sealed envelope for you to review at your convenience later.  YOU SHOULD EXPECT: Some feelings of bloating in the abdomen. Passage of more gas than usual.  Walking can help get rid of the air that was put into your GI tract during the procedure and reduce the bloating. If you had a lower endoscopy (such as a colonoscopy or flexible sigmoidoscopy) you may notice spotting of blood in your stool or on the toilet paper. If you underwent a bowel prep for your procedure, you may not have a normal bowel movement for a few days.  Please Note:  You might notice some irritation and congestion in your nose or some drainage.  This is from the oxygen used during your procedure.  There is no need for concern and it should clear up in a day or so.  Thank you for coming in to see [place] today! Resume your normal diet and medications today. Return to your regular daily activities tomorrow. Final results of polyps removed will be available in 1-2 weeks.  Will make recommendation at that time for your next colonoscopy.   SYMPTOMS TO REPORT IMMEDIATELY:  Following lower endoscopy (colonoscopy or flexible sigmoidoscopy):  Excessive amounts of blood in the stool  Significant tenderness or worsening of abdominal pains  Swelling of the abdomen that is new, acute  Fever of 100F or higher    For urgent or emergent issues, a gastroenterologist can be reached at any hour by calling [phone]. Do not use MyChart messaging for urgent concerns.    DIET:  We do recommend a small meal  at first, but then you may proceed to your regular diet.  Drink plenty of fluids but you should avoid alcoholic beverages for 24 hours.  ACTIVITY:  You should plan to take it easy for the rest of today and you should NOT DRIVE or use heavy machinery until tomorrow (because of the sedation medicines used during the test).    FOLLOW UP: Our staff will call the number listed on your records the next business day following your procedure.  We will call around 7:15- 8:00 am to check on you and address any questions or concerns that you may have regarding the information given to you following your procedure. If we do not reach you, we will leave a message.  If you develop any symptoms (ie: fever, flu-like symptoms, shortness of breath, cough etc.) before then, please call [phone].  If you test positive for Covid 19 in the 2 weeks post procedure, please call and report this information to [place].    If any biopsies were taken you will be contacted by phone or by letter within the next 1-3 weeks.  Please call us at [phone] if you have not heard about the biopsies in 3 weeks.    SIGNATURES/CONFIDENTIALITY: You and/or your care partner have signed paperwork which will be entered into your electronic medical record.  These signatures attest to the fact that that the information above on your After Visit Summary has been reviewed and is understood.  Full responsibility of the confidentiality of this discharge information lies with you and/or your care-partner.  

## 2022-03-10 ENCOUNTER — Telehealth: Payer: Self-pay

## 2022-03-10 NOTE — Telephone Encounter (Signed)
  Follow up Call-     03/09/2022   11:04 AM  Call back number  Post procedure Call Back phone  # [phone]  Permission to leave phone message Yes     Patient questions:  Do you have a fever, pain , or abdominal swelling? No. Pain Score  0 *  Have you tolerated food without any problems? Yes.    Have you been able to return to your normal activities? Yes.    Do you have any questions about your discharge instructions: Diet   No. Medications  No. Follow up visit  No.  Do you have questions or concerns about your Care? No.  Actions: * If pain score is 4 or above: No action needed, pain <4.

## 2022-03-12 ENCOUNTER — Encounter: Payer: Self-pay | Admitting: Gastroenterology

## 2022-05-14 ENCOUNTER — Other Ambulatory Visit: Payer: Self-pay | Admitting: Internal Medicine

## 2022-07-08 ENCOUNTER — Other Ambulatory Visit: Payer: Self-pay

## 2022-07-08 DIAGNOSIS — E890 Postprocedural hypothyroidism: Secondary | ICD-10-CM

## 2022-07-08 MED ORDER — LEVOTHYROXINE SODIUM 112 MCG PO TABS: ORAL_TABLET | ORAL | 3 refills | Status: DC

## 2022-10-05 ENCOUNTER — Encounter: Payer: Self-pay | Admitting: Gynecologic Oncology

## 2022-11-27 ENCOUNTER — Telehealth: Payer: Self-pay

## 2022-11-27 NOTE — Telephone Encounter (Signed)
LVM for patient to call office regarding an appointment with Dr. Pricilla Holm (as an established pt 30 minute) on 5/24 @ 8:30

## 2022-11-30 NOTE — Telephone Encounter (Signed)
Spoke with the patient and scheduled a follow up appt with Dr Pricilla Holm on 5/31

## 2022-12-01 ENCOUNTER — Other Ambulatory Visit: Payer: Self-pay | Admitting: Gynecologic Oncology

## 2022-12-01 DIAGNOSIS — N952 Postmenopausal atrophic vaginitis: Secondary | ICD-10-CM

## 2023-01-15 ENCOUNTER — Inpatient Hospital Stay: Payer: 59 | Attending: Gynecologic Oncology | Admitting: Gynecologic Oncology

## 2023-01-15 ENCOUNTER — Encounter: Payer: Self-pay | Admitting: Gynecologic Oncology

## 2023-01-15 VITALS — BP 103/76 | HR 71 | Temp 97.8°F | Resp 18 | Ht 67.72 in | Wt 157.8 lb

## 2023-01-15 DIAGNOSIS — Z90710 Acquired absence of both cervix and uterus: Secondary | ICD-10-CM

## 2023-01-15 DIAGNOSIS — R102 Pelvic and perineal pain: Secondary | ICD-10-CM | POA: Insufficient documentation

## 2023-01-15 DIAGNOSIS — R87622 Low grade squamous intraepithelial lesion on cytologic smear of vagina (LGSIL): Secondary | ICD-10-CM | POA: Insufficient documentation

## 2023-01-15 DIAGNOSIS — A630 Anogenital (venereal) warts: Secondary | ICD-10-CM | POA: Diagnosis not present

## 2023-01-15 DIAGNOSIS — N952 Postmenopausal atrophic vaginitis: Secondary | ICD-10-CM

## 2023-01-15 DIAGNOSIS — N893 Dysplasia of vagina, unspecified: Secondary | ICD-10-CM

## 2023-01-15 DIAGNOSIS — Z87411 Personal history of vaginal dysplasia: Secondary | ICD-10-CM | POA: Insufficient documentation

## 2023-01-15 NOTE — Progress Notes (Signed)
Gynecologic Oncology Return Clinic Visit  01/15/23  Reason for Visit: follow-up  Treatment History: GYN history is notable for hysterectomy in 2004 for fibroids. In 04/2012, patient underwent laparotomy with lysis of adhesions and BSO due to pelvic pain and pelvic adhesive disease that had been noted on diagnostic laparoscopy the month prior.   Patient has a history of vaginal dysplasia treated with laser ablation in 2013.  Paps were subsequently reported to be normal. Vaginal Pap performed on 09/23/2020: Negative, high risk HPV detected. Vaginal Pap performed on 10/01/2021 shows atypical squamous cells, cannot exclude high-grade squamous intraepithelial lesion.  High risk HPV detected. The patient then underwent vaginoscopy with findings of 2 acetowhite areas at the lateral vaginal cuff that were biopsied.  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  Recent pap 10/2022: LSIL, HR HPV+, HPV 16/18/45 negative.  Interval History: Denies any vaginal symptoms including discharge or bleeding.  Endorses normal bowel and bladder function.  Endorses pelvic pain, left sided, seems to be aggravated by working out.  Denies pain if resting.  Also endorses some back pain intermittently.  Past Medical/Surgical History: Past Medical History:  Diagnosis Date   Abdominal pain, left lower quadrant 03/26/2008   ABDOMINAL PAIN, SUPRAPUBIC 08/04/2007   ALLERGIC RHINITIS 08/04/2007   Allergy    Anemia    ANXIETY 08/04/2007   Arthritis    mild   Cough    instructed to see primary care if worsens-no fever   DEPRESSION 08/04/2007   FATIGUE 08/04/2007   GERD 08/04/2007   Headache(784.0)    Hyperlipidemia 05/20/2014   Insomnia    PONV (postoperative nausea and vomiting)    SINUSITIS- ACUTE-NOS 07/25/2010   SVD (spontaneous vaginal delivery)    x 4   Thyroid disease     Past Surgical History:  Procedure Laterality Date   ABDOMINAL HYSTERECTOMY  08/17/2002    fibroids   COLONOSCOPY     LAPAROSCOPY  03/23/2012   Procedure: LAPAROSCOPY OPERATIVE;  Surgeon: Meriel Pica, MD;  Location: [facility];  Service: Gynecology;  Laterality: N/A;  with CO2 laser ablation of Vin;need microscope,vaginal vault excision of keloid;will want kenolog 40 from pharmacy.   LAPAROTOMY  05/02/2012   Procedure: EXPLORATORY LAPAROTOMY;  Surgeon: Meriel Pica, MD;  Location: [facility];  Service: Gynecology;  Laterality: N/A;   LASER ABLATION  2013   vagina   POLYPECTOMY     RHINOPLASTY     nose   SALPINGOOPHORECTOMY  05/02/2012   Procedure: SALPINGO OOPHERECTOMY;  Surgeon: Meriel Pica, MD;  Location: [facility];  Service: Gynecology;  Laterality: Bilateral;  bilateral   SCAR REVISION  03/23/2012   Procedure: SCAR REVISION;  Surgeon: Meriel Pica, MD;  Location: [facility];  Service: Gynecology;  Laterality: N/A;   SCAR REVISION N/A 08/08/2013   Procedure: SCAR REVISION ABDOMINAL;  Surgeon: Meriel Pica, MD;  Location: [facility];  Service: Gynecology;  Laterality: N/A;   TUBAL LIGATION     WISDOM TOOTH EXTRACTION      Family History  Problem Relation Age of Onset   Asthma Mother    Thyroid disease Neg Hx    Colon cancer Neg Hx    Esophageal cancer Neg Hx    Rectal cancer Neg Hx    Stomach cancer Neg Hx    Breast cancer Neg Hx    Ovarian cancer Neg Hx    Endometrial cancer Neg Hx    Pancreatic cancer Neg Hx  Prostate cancer Neg Hx    Colon polyps Neg Hx     Social History   Socioeconomic History   Marital status: Divorced    Spouse name: Not on file   Number of children: Not on file   Years of education: Not on file   Highest education level: Not on file  Occupational History   Occupation: Location manager    Comment: Engineer, 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)  Tobacco Use   Smoking status: Never   Smokeless tobacco: Never  Vaping Use   Vaping Use: Never used  Substance and Sexual Activity   Alcohol use: No   Drug use: No   Sexual activity: Yes     Birth control/protection: Surgical  Other Topics Concern   Not on file  Social History Narrative   Not on file   Social Determinants of Health   Financial Resource Strain: Not on file  Food Insecurity: Not on file  Transportation Needs: Not on file  Physical Activity: Not on file  Stress: Not on file  Social Connections: Not on file    Current Medications:  Current Outpatient Medications:    Cholecalciferol (VITAMIN D3 PO), Take by mouth., Disp: , Rfl:    citalopram (CELEXA) 20 MG tablet, TAKE 1 TABLET(20 MG) BY MOUTH DAILY, Disp: 90 tablet, Rfl: 3   Cyanocobalamin (VITAMIN B 12 PO), Take by mouth., Disp: , Rfl:    diphenhydrAMINE (BENADRYL) 25 MG tablet, Take 25 mg by mouth every 6 (six) hours as needed., Disp: , Rfl:    estradiol (ESTRACE) 0.1 MG/GM vaginal cream, INSERT 1 APPLICATORFUL VAGINALLY 3 TIMES A WEEK, Disp: 42.5 g, Rfl: 12   hydrOXYzine (ATARAX/VISTARIL) 25 MG tablet, Take 1 tablet (25 mg total) by mouth 3 (three) times daily as needed., Disp: 100 tablet, Rfl: 3   levothyroxine (SYNTHROID) 112 MCG tablet, TAKE 1 TABLET(112 MCG) BY MOUTH DAILY, Disp: 90 tablet, Rfl: 3   Loratadine 10 MG CAPS, Take by mouth., Disp: , Rfl:    traZODone (DESYREL) 50 MG tablet, Take 0.5-1 tablets (25-50 mg total) by mouth at bedtime as needed for sleep., Disp: 90 tablet, Rfl: 1   triamcinolone cream (KENALOG) 0.1 %, Apply 1 application topically 2 (two) times daily., Disp: 160 g, Rfl: 3  Review of Systems: + Hot flashes, joint pain, back pain, muscle pain/cramp, itch, numbness, anxiety. Denies appetite changes, fevers, chills, fatigue, unexplained weight changes. Denies hearing loss, neck lumps or masses, mouth sores, ringing in ears or voice changes. Denies cough or wheezing.  Denies shortness of breath. Denies chest pain or palpitations. Denies leg swelling. Denies abdominal distention, pain, blood in stools, constipation, diarrhea, nausea, vomiting, or early satiety. Denies pain with  intercourse, dysuria, frequency, hematuria or incontinence. Denies pelvic pain, vaginal bleeding or vaginal discharge.   Denies rash, or wounds. Denies dizziness, headaches, or seizures. Denies swollen lymph nodes or glands, denies easy bruising or bleeding. Denies depression, confusion, or decreased concentration.  Physical Exam: BP 103/76 (BP Location: Left Arm, Patient Position: Sitting)   Pulse 71   Temp 97.8 F (36.6 C) (Oral)   Resp 18   Ht 5' 7.72" (1.72 m)   Wt 157 lb 12.8 oz (71.6 kg)   SpO2 98%   BMI 24.19 kg/m  General: Alert, oriented, no acute distress. HEENT: Normocephalic, atraumatic. Sclera anicteric. eric. Chest: Unlabored breathing on room air.  GU: Normal appearing external genitalia without erythema, excoriation, or lesions.  Speculum exam reveals mildly atrophic vaginal mucosa.  Bimanual exam reveals no nodularity  or masses.  Rectovaginal exam confirms findings.  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  She was placed in dorsolithotomy position and a speculum was placed in the vagina.  Once the vaginal cuff had been well visualized, percent acetic acid was applied. Vaginoscopy of the vaginal apex and cuff was performed.  Acetowhite changes were noted along much of the vaginal cuff although 2 areas with some mild atypical vascularity were noted along the midportion of the cuff.  These were especially prominent with the use of green light filter.  I discussed biopsy of 1 of these areas with the patient.  After she gave verbal consent, the vaginal cuff was cleansed with Betadine x3.  Tischler forceps were then used to take a biopsy of the more inferior lesion.  This  was placed in formalin to be sent to pathology.  Monsel's and pressure were used to achieve hemostasis.  Overall the patient tolerated the procedure well.  All instruments were removed from the vagina.  Laboratory & Radiologic Studies: None new  Assessment & Plan: [name] is a 52 y.o. 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  Recent Pap discussed with patient.  Given results, colposcopy recommended.  This was performed today with some mild acetowhite changes with atypical vascularity noted along the midportion of the cuff.  Colposcopic impression was low-grade dysplasia.  Biopsy taken here.  I will call the patient next week with biopsy results.  In the setting of her history of vaginal dysplasia, I encouraged her to continue vaginal estrogen as she has been doing 2-3 times a week.  I encouraged her to reach back out to her OB/GYN in regards to her pelvic pain.  22 minutes of total time was spent for this patient encounter, including preparation, face-to-face counseling with the patient and coordination of care, and documentation of the encounter.  Eugene Garnet, MD  Division of Gynecologic Oncology  Department of Obstetrics and Gynecology  [facility]

## 2023-01-15 NOTE — Patient Instructions (Signed)
It was good to see you.  I will call you on Wednesday with your biopsy results.

## 2023-01-19 LAB — SURGICAL PATHOLOGY

## 2023-01-21 ENCOUNTER — Telehealth: Payer: Self-pay | Admitting: *Deleted

## 2023-01-21 NOTE — Progress Notes (Signed)
Would you please call this patient and let her know the following: good news, biopsy shows low grade precancer, no evidence of high grade precancer or cancer. This is something that we will just continue to watch. I would recommend she continues to use the vaginal estrogen. She needs to see her OBGYN for follow-up in 6 months.

## 2023-01-21 NOTE — Telephone Encounter (Signed)
Spoke with [name] this afternoon and relayed message from Dr. Pricilla Holm that  biopsy shows low grade precancer, no evidence of high grade precancer or cancer. This is something that we will just continue to watch. Dr. Pricilla Holm recommends Patient continues to use the vaginal estrogen. And follow up with your OBGYN in 6 months. Patient thanked the office for the good news. No further concerns or questions at this time.

## 2023-01-21 NOTE — Telephone Encounter (Signed)
-----   Message from Carver Fila, MD sent at 01/21/2023  1:23 PM EDT ----- Would you please call this patient and let her know the following: good news, biopsy shows low grade precancer, no evidence of high grade precancer or cancer. This is something that we will just continue to watch. I would recommend she continues to u se the vaginal estrogen. She needs to see her OBGYN for follow-up in 6 months.

## 2023-03-17 ENCOUNTER — Telehealth: Payer: Self-pay

## 2023-03-17 ENCOUNTER — Other Ambulatory Visit: Payer: Self-pay | Admitting: Gynecologic Oncology

## 2023-03-17 DIAGNOSIS — N952 Postmenopausal atrophic vaginitis: Secondary | ICD-10-CM

## 2023-03-17 MED ORDER — ESTRADIOL 0.1 MG/GM VA CREA: 1.0000 | TOPICAL_CREAM | VAGINAL | 12 refills | Status: AC

## 2023-03-17 NOTE — Telephone Encounter (Signed)
[name] called office stating she needs a refill on the Estradiol vaginal cream.   Please send to [facility] on [address] (Updated in chart)

## 2023-03-31 ENCOUNTER — Ambulatory Visit: Payer: 59 | Admitting: Internal Medicine

## 2023-03-31 VITALS — BP 122/82 | HR 72 | Temp 98.4°F | Ht 67.2 in | Wt 159.8 lb

## 2023-03-31 DIAGNOSIS — E559 Vitamin D deficiency, unspecified: Secondary | ICD-10-CM

## 2023-03-31 DIAGNOSIS — E538 Deficiency of other specified B group vitamins: Secondary | ICD-10-CM | POA: Diagnosis not present

## 2023-03-31 DIAGNOSIS — E7800 Pure hypercholesterolemia, unspecified: Secondary | ICD-10-CM | POA: Diagnosis not present

## 2023-03-31 DIAGNOSIS — R739 Hyperglycemia, unspecified: Secondary | ICD-10-CM | POA: Diagnosis not present

## 2023-03-31 DIAGNOSIS — Z0000 Encounter for general adult medical examination without abnormal findings: Secondary | ICD-10-CM | POA: Diagnosis not present

## 2023-03-31 DIAGNOSIS — F411 Generalized anxiety disorder: Secondary | ICD-10-CM

## 2023-03-31 DIAGNOSIS — R102 Pelvic and perineal pain: Secondary | ICD-10-CM

## 2023-03-31 DIAGNOSIS — E890 Postprocedural hypothyroidism: Secondary | ICD-10-CM | POA: Diagnosis not present

## 2023-03-31 DIAGNOSIS — Z0001 Encounter for general adult medical examination with abnormal findings: Secondary | ICD-10-CM

## 2023-03-31 DIAGNOSIS — N893 Dysplasia of vagina, unspecified: Secondary | ICD-10-CM

## 2023-03-31 DIAGNOSIS — F32A Depression, unspecified: Secondary | ICD-10-CM

## 2023-03-31 LAB — BASIC METABOLIC PANEL
BUN: 13 mg/dL (ref 6–23)
CO2: 32 mEq/L (ref 19–32)
Calcium: 9.8 mg/dL (ref 8.4–10.5)
Chloride: 100 mEq/L (ref 96–112)
Creatinine, Ser: 0.61 mg/dL (ref 0.40–1.20)
GFR: 101.45 mL/min (ref >60.00)
Glucose, Bld: 106 mg/dL — ABNORMAL HIGH (ref 70–99)
Potassium: 4.2 mEq/L (ref 3.5–5.1)
Sodium: 139 mEq/L (ref 135–145)

## 2023-03-31 LAB — HEPATIC FUNCTION PANEL
ALT: 29 U/L (ref 0–35)
AST: 25 U/L (ref 0–37)
Albumin: 4.4 g/dL (ref 3.5–5.2)
Alkaline Phosphatase: 85 U/L (ref 39–117)
Bilirubin, Direct: 0.1 mg/dL (ref 0.0–0.3)
Total Bilirubin: 0.4 mg/dL (ref 0.2–1.2)
Total Protein: 7.6 g/dL (ref 6.0–8.3)

## 2023-03-31 LAB — CBC WITH DIFFERENTIAL/PLATELET
Basophils Absolute: 0 10*3/uL (ref 0.0–0.1)
Basophils Relative: 0.6 % (ref 0.0–3.0)
Eosinophils Absolute: 0.1 10*3/uL (ref 0.0–0.7)
Eosinophils Relative: 1.7 % (ref 0.0–5.0)
HCT: 40.1 % (ref 36.0–46.0)
Hemoglobin: 13.1 g/dL (ref 12.0–15.0)
Lymphocytes Relative: 37.4 % (ref 12.0–46.0)
Lymphs Abs: 2.6 10*3/uL (ref 0.7–4.0)
MCHC: 32.5 g/dL (ref 30.0–36.0)
MCV: 88.8 fl (ref 78.0–100.0)
Monocytes Absolute: 0.4 10*3/uL (ref 0.1–1.0)
Monocytes Relative: 5.4 % (ref 3.0–12.0)
Neutro Abs: 3.8 10*3/uL (ref 1.4–7.7)
Neutrophils Relative %: 54.9 % (ref 43.0–77.0)
Platelets: 183 10*3/uL (ref 150.0–400.0)
RBC: 4.52 Mil/uL (ref 3.87–5.11)
RDW: 13.5 % (ref 11.5–15.5)
WBC: 7 10*3/uL (ref 4.0–10.5)

## 2023-03-31 LAB — URINALYSIS, ROUTINE W REFLEX MICROSCOPIC
Bilirubin Urine: NEGATIVE
Hgb urine dipstick: NEGATIVE
Ketones, ur: NEGATIVE
Leukocytes,Ua: NEGATIVE
Nitrite: NEGATIVE
Specific Gravity, Urine: 1.02 (ref 1.000–1.030)
Urine Glucose: NEGATIVE
Urobilinogen, UA: 0.2 (ref 0.0–1.0)
pH: 7.5 (ref 5.0–8.0)

## 2023-03-31 LAB — LIPID PANEL
Cholesterol: 212 mg/dL — ABNORMAL HIGH (ref 0–200)
HDL: 37.5 mg/dL — ABNORMAL LOW (ref >39.00)
LDL Cholesterol: 138 mg/dL — ABNORMAL HIGH (ref 0–99)
NonHDL: 174.11
Total CHOL/HDL Ratio: 6
Triglycerides: 181 mg/dL — ABNORMAL HIGH (ref 0.0–149.0)
VLDL: 36.2 mg/dL (ref 0.0–40.0)

## 2023-03-31 LAB — VITAMIN D 25 HYDROXY (VIT D DEFICIENCY, FRACTURES): VITD: 33.78 ng/mL (ref 30.00–100.00)

## 2023-03-31 LAB — MICROALBUMIN / CREATININE URINE RATIO
Creatinine,U: 165.8 mg/dL
Microalb Creat Ratio: 2.2 mg/g (ref 0.0–30.0)
Microalb, Ur: 3.7 mg/dL — ABNORMAL HIGH (ref 0.0–1.9)

## 2023-03-31 LAB — TSH: TSH: 5.63 u[IU]/mL — ABNORMAL HIGH (ref 0.35–5.50)

## 2023-03-31 LAB — HEMOGLOBIN A1C: Hgb A1c MFr Bld: 6.3 % (ref 4.6–6.5)

## 2023-03-31 LAB — VITAMIN B12: Vitamin B-12: 1076 pg/mL — ABNORMAL HIGH (ref 211–911)

## 2023-03-31 LAB — T4, FREE: Free T4: 0.95 ng/dL (ref 0.60–1.60)

## 2023-03-31 MED ORDER — CITALOPRAM HYDROBROMIDE 20 MG PO TABS: 20.0000 mg | ORAL_TABLET | Freq: Every day | ORAL | 3 refills | Status: DC

## 2023-03-31 MED ORDER — PANTOPRAZOLE SODIUM 40 MG PO TBEC: 40.0000 mg | DELAYED_RELEASE_TABLET | Freq: Every day | ORAL | 3 refills | Status: AC

## 2023-03-31 MED ORDER — CYCLOBENZAPRINE HCL 5 MG PO TABS: 5.0000 mg | ORAL_TABLET | Freq: Three times a day (TID) | ORAL | 1 refills | Status: AC | PRN

## 2023-03-31 MED ORDER — LEVOTHYROXINE SODIUM 112 MCG PO TABS
ORAL_TABLET | ORAL | 3 refills | Status: DC
Start: 2023-03-31 — End: 2024-05-02

## 2023-03-31 NOTE — Progress Notes (Signed)
Patient ID: [name], female   DOB: Apr 19, 1971, 52 y.o.   MRN: [id number]         Chief Complaint:: wellness exam and multiple issues including hld, vaginal precancer, fall with recent head trauma and brief LOC, muscle cramps, epigastric pain/reflux, depression       HPI:  [name] is a 52 y.o. female here for wellness exam; for tdap and shingrix at pharmacy, o/w up , o/w up to date                        Also has mild persistent mild worsening depressive symptoms, but no suicidal ideation, or panic; has ongoing anxiety, declines med change today or referral for counseling. 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  Does has other muscle cramping mild intermittent to legs and hands occasionally.  Did have recent vagainl biopsy with precancer but pt was unaware of recommendation to f/u with primary GYN at 6 mo. Also of note is that 2 wks she suffered a trip and fall backwards on hard surface to back of head without bleeding but did  have brief LOC, but somewhat due to cost has delcines ED or other evaluation, and declines CT or MRI today.       Wt Readings from Last 3 Encounters:  03/31/23 159 lb 12.8 oz (72.5 kg)  01/15/23 157 lb 12.8 oz (71.6 kg)  03/09/22 158 lb (71.7 kg)   BP Readings from Last 3 Encounters:  03/31/23 122/82  01/15/23 103/76  03/09/22 103/64   Immunization History  Administered Date(s) Administered   Influenza Split 05/03/2012   Influenza Whole 05/23/2008   Influenza,inj,Quad PF,6+ Mos 05/18/2014, 06/12/2015, 06/12/2016, 10/27/2017   PFIZER(Purple Top)SARS-COV-2 Vaccination 02/15/2020, 03/06/2020, 08/12/2020   Tdap 03/29/2013   Health Maintenance Due  Topic Date Due   Zoster Vaccines- Shingrix (1 of 2) Never done   DTaP/Tdap/Td (2 - Td or Tdap) 03/30/2023      Past Medical History:   Diagnosis Date   Abdominal pain, left lower quadrant 03/26/2008   ABDOMINAL PAIN, SUPRAPUBIC 08/04/2007   ALLERGIC RHINITIS 08/04/2007   Allergy    Anemia    ANXIETY 08/04/2007   Arthritis    mild   Cough    instructed to see primary care if worsens-no fever   DEPRESSION 08/04/2007   FATIGUE 08/04/2007   GERD 08/04/2007   Headache(784.0)    Hyperlipidemia 05/20/2014   Insomnia    PONV (postoperative nausea and vomiting)    SINUSITIS- ACUTE-NOS 07/25/2010   SVD (spontaneous vaginal delivery)    x 4   Thyroid disease    Past Surgical History:  Procedure Laterality Date   ABDOMINAL HYSTERECTOMY  08/17/2002   fibroids   COLONOSCOPY     LAPAROSCOPY  03/23/2012   Procedure: LAPAROSCOPY OPERATIVE;  Surgeon: Meriel Pica, MD;  Location: [facility];  Service: Gynecology;  Laterality: N/A;  with CO2 laser ablation of Vin;need microscope,vaginal vault excision of keloid;will want kenolog 40 from pharmacy.   LAPAROTOMY  05/02/2012   Procedure: EXPLORATORY LAPAROTOMY;  Surgeon: Meriel Pica, MD;  Location: [facility];  Service: Gynecology;  Laterality: N/A;   LASER ABLATION  2013   vagina   POLYPECTOMY     RHINOPLASTY     nose  SALPINGOOPHORECTOMY  05/02/2012   Procedure: SALPINGO OOPHERECTOMY;  Surgeon: Meriel Pica, MD;  Location: [facility];  Service: Gynecology;  Laterality: Bilateral;  bilateral   SCAR REVISION  03/23/2012   Procedure: SCAR REVISION;  Surgeon: Meriel Pica, MD;  Location: [facility];  Service: Gynecology;  Laterality: N/A;   SCAR REVISION N/A 08/08/2013   Procedure: SCAR REVISION ABDOMINAL;  Surgeon: Meriel Pica, MD;  Location: [facility];  Service: Gynecology;  Laterality: N/A;   TUBAL LIGATION     WISDOM TOOTH EXTRACTION      reports that she has never smoked. She has never used smokeless tobacco. She reports that she does not drink alcohol and does not use drugs. family history includes Asthma in her mother. Allergies  Allergen Reactions   Latex  Shortness Of Breath, Swelling and Rash   Codeine Itching   Crestor [Rosuvastatin]    Lipitor [Atorvastatin] Other (See Comments)    myalgia   Methimazole Hives and Itching   Other Itching and Nausea And Vomiting    Pain medication given after surgery; pt doesn't remember name.  Most likely opioid-derived.   Soy Allergy Swelling   Current Outpatient Medications on File Prior to Visit  Medication Sig Dispense Refill   Cholecalciferol (VITAMIN D3 PO) Take by mouth.     Cyanocobalamin (VITAMIN B 12 PO) Take by mouth.     diphenhydrAMINE (BENADRYL) 25 MG tablet Take 25 mg by mouth every 6 (six) hours as needed.     estradiol (ESTRACE) 0.1 MG/GM vaginal cream Place 1 Applicatorful vaginally 3 (three) times a week. 42.5 g 12   hydrOXYzine (ATARAX/VISTARIL) 25 MG tablet Take 1 tablet (25 mg total) by mouth 3 (three) times daily as needed. 100 tablet 3   Loratadine 10 MG CAPS Take by mouth.     traZODone (DESYREL) 50 MG tablet Take 0.5-1 tablets (25-50 mg total) by mouth at bedtime as needed for sleep. 90 tablet 1   triamcinolone cream (KENALOG) 0.1 % Apply 1 application topically 2 (two) times daily. 160 g 3   No current facility-administered medications on file prior to visit.        ROS:  All others reviewed and negative.  Objective        PE:  BP 122/82 (BP Location: Left Arm, Patient Position: Sitting, Cuff Size: Normal)   Pulse 72   Temp 98.4 F (36.9 C) (Oral)   Ht 5' 7.2" (1.707 m)   Wt 159 lb 12.8 oz (72.5 kg)   SpO2 94%   BMI 24.88 kg/m                 Constitutional: Pt appears in NAD               HENT: Head: NCAT.                Right Ear: External ear normal.                 Left Ear: External ear normal.                Eyes: . Pupils are equal, round, and reactive to light. Conjunctivae and EOM are normal               Nose: without d/c or deformity               Neck: Neck supple. Gross normal ROM  Cardiovascular: Normal rate and regular rhythm.                  Pulmonary/Chest: Effort normal and breath sounds without rales or wheezing.                Abd:  Soft, NT, ND, + BS, no organomegaly               Neurological: Pt is alert. At baseline orientation, motor grossly intact               Skin: Skin is warm. No rashes, no other new lesions, LE edema - none               Psychiatric: Pt behavior is normal without agitation , depressed nervous affect  Micro: none  Cardiac tracings I have personally interpreted today:  none  Pertinent Radiological findings (summarize): none   Lab Results  Component Value Date   WBC 7.0 03/31/2023   HGB 13.1 03/31/2023   HCT 40.1 03/31/2023   PLT 183.0 03/31/2023   GLUCOSE 106 (H) 03/31/2023   CHOL 212 (H) 03/31/2023   TRIG 181.0 (H) 03/31/2023   HDL 37.50 (L) 03/31/2023   LDLDIRECT 153.0 12/25/2021   LDLCALC 138 (H) 03/31/2023   ALT 29 03/31/2023   AST 25 03/31/2023   NA 139 03/31/2023   K 4.2 03/31/2023   CL 100 03/31/2023   CREATININE 0.61 03/31/2023   BUN 13 03/31/2023   CO2 32 03/31/2023   TSH 5.63 (H) 03/31/2023   HGBA1C 6.3 03/31/2023   MICROALBUR 3.7 (H) 03/31/2023   Assessment/Plan:  [name] is a 52 y.o. Asian [4] female with  has a past medical history of Abdominal pain, left lower quadrant (03/26/2008), ABDOMINAL PAIN, SUPRAPUBIC (08/04/2007), ALLERGIC RHINITIS (08/04/2007), Allergy, Anemia, ANXIETY (08/04/2007), Arthritis, Cough, DEPRESSION (08/04/2007), FATIGUE (08/04/2007), GERD (08/04/2007), Headache(784.0), Hyperlipidemia (05/20/2014), Insomnia, PONV (postoperative nausea and vomiting), SINUSITIS- ACUTE-NOS (07/25/2010), SVD (spontaneous vaginal delivery), and Thyroid disease.  Encounter for well adult exam with abnormal findings Age and sex appropriate education and counseling updated with regular exercise and diet Referrals for preventative services - pt for referral to Dr Marcelle Overlie GYN for f/u as recommended after recent precancer vaginal biopsy per GYN  oncology Immunizations addressed - for tdap and shingrx at pharmacy Smoking counseling  - none needed Evidence for depression or other mood disorder - chronic mild persistent anxiety depression Most recent labs reviewed. I have personally reviewed and have noted: 1) the patient's medical and social history 2) The patient's current medications and supplements 3) The patient's height, weight, and BMI have been recorded in the chart   Hyperlipidemia Lab Results  Component Value Date   LDLCALC 138 (H) 03/31/2023   Uncontrolled, goal ldl < 100, decliens statin for  now, for lower chol diet  Hyperglycemia Lab Results  Component Value Date   HGBA1C 6.3 03/31/2023   Stable, pt to continue current medical treatment  - diet,wt control   Hypothyroidism Lab Results  Component Value Date   TSH 5.63 (H) 03/31/2023   Stable overall, pt to continue levothyroxine 112 mcg qd   Anxiety state Chronic mild persistent, declines change in tx or counseling referral  Depression Chronic mld persistent, to contine celexa 20 every day, declines other for now  Vitamin D deficiency Last vitamin D Lab Results  Component Value Date   VD25OH 33.78 03/31/2023   Low, to start oral replacement   VAIN (vaginal intraepithelial neoplasia) Pt encouraged to f/u Dr  Holland at 6 mon as recommended  Followup: Return in about 1 year (around 03/30/2024).  Oliver Barre, MD 04/03/2023 8:29 PM [facility] - [facility] Medicine

## 2023-03-31 NOTE — Progress Notes (Signed)
The test results show that your current treatment is OK, as the tests are stable.  Please continue the same plan.  There is no other need for change of treatment or further evaluation based on these results, at this time.  thanks 

## 2023-03-31 NOTE — Patient Instructions (Signed)
Please take all new medication as prescribed- the protonix for the stomach, and flexeril for muscle cramps  Please continue all other medications as before, and refills have been done if requested.  Please have the pharmacy call with any other refills you may need.  Please continue your efforts at being more active, low cholesterol diet, and weight control.  You are otherwise up to date with prevention measures today.  Please keep your appointments with your specialists as you may have planned  You will be contacted regarding the referral for: Dr Marcelle Overlie for Dec 2024  Please go to the LAB at the blood drawing area for the tests to be done  You will be contacted by phone if any changes need to be made immediately.  Otherwise, you will receive a letter about your results with an explanation, but please check with MyChart first.  Please remember to sign up for MyChart if you have not done so, as this will be important to you in the future with finding out test results, communicating by private email, and scheduling acute appointments online when needed.  Please make an Appointment to return for your 1 year visit, or sooner if needed

## 2023-04-03 ENCOUNTER — Encounter: Payer: Self-pay | Admitting: Internal Medicine

## 2023-04-03 NOTE — Assessment & Plan Note (Signed)
Lab Results  Component Value Date   LDLCALC 138 (H) 03/31/2023   Uncontrolled, goal ldl < 100, decliens statin for  now, for lower chol diet

## 2023-04-03 NOTE — Assessment & Plan Note (Signed)
Pt encouraged to f/u Dr Marcelle Overlie at 6 mon as recommended

## 2023-04-03 NOTE — Assessment & Plan Note (Signed)
Lab Results  Component Value Date   HGBA1C 6.3 03/31/2023   Stable, pt to continue current medical treatment  - diet,wt control

## 2023-04-03 NOTE — Assessment & Plan Note (Signed)
Chronic mld persistent, to contine celexa 20 every day, declines other for now

## 2023-04-03 NOTE — Assessment & Plan Note (Signed)
Age and sex appropriate education and counseling updated with regular exercise and diet Referrals for preventative services - pt for referral to Dr Marcelle Overlie GYN for f/u as recommended after recent precancer vaginal biopsy per GYN oncology Immunizations addressed - for tdap and shingrx at pharmacy Smoking counseling  - none needed Evidence for depression or other mood disorder - chronic mild persistent anxiety depression Most recent labs reviewed. I have personally reviewed and have noted: 1) the patient's medical and social history 2) The patient's current medications and supplements 3) The patient's height, weight, and BMI have been recorded in the chart

## 2023-04-03 NOTE — Assessment & Plan Note (Signed)
Chronic mild persistent, declines change in tx or counseling referral

## 2023-04-03 NOTE — Assessment & Plan Note (Signed)
Last vitamin D Lab Results  Component Value Date   VD25OH 33.78 03/31/2023   Low, to start oral replacement

## 2023-04-03 NOTE — Assessment & Plan Note (Signed)
Lab Results  Component Value Date   TSH 5.63 (H) 03/31/2023   Stable overall, pt to continue levothyroxine 112 mcg qd

## 2023-04-28 ENCOUNTER — Ambulatory Visit: Payer: 59 | Admitting: Internal Medicine

## 2023-04-28 ENCOUNTER — Encounter: Payer: Self-pay | Admitting: Internal Medicine

## 2023-04-28 VITALS — BP 122/74 | HR 65 | Temp 99.1°F | Ht 67.2 in | Wt 156.0 lb

## 2023-04-28 DIAGNOSIS — M659 Synovitis and tenosynovitis, unspecified: Secondary | ICD-10-CM | POA: Diagnosis not present

## 2023-04-28 DIAGNOSIS — E7800 Pure hypercholesterolemia, unspecified: Secondary | ICD-10-CM | POA: Diagnosis not present

## 2023-04-28 DIAGNOSIS — R739 Hyperglycemia, unspecified: Secondary | ICD-10-CM | POA: Diagnosis not present

## 2023-04-28 DIAGNOSIS — E890 Postprocedural hypothyroidism: Secondary | ICD-10-CM | POA: Diagnosis not present

## 2023-04-28 DIAGNOSIS — M65942 Unspecified synovitis and tenosynovitis, left hand: Secondary | ICD-10-CM | POA: Insufficient documentation

## 2023-04-28 MED ORDER — PREDNISONE 10 MG PO TABS: ORAL_TABLET | ORAL | 0 refills | Status: AC

## 2023-04-28 MED ORDER — REPATHA SURECLICK 140 MG/ML ~~LOC~~ SOAJ: 140.0000 mg | SUBCUTANEOUS | 3 refills | Status: DC

## 2023-04-28 MED ORDER — TRAMADOL HCL 50 MG PO TABS: 50.0000 mg | ORAL_TABLET | Freq: Four times a day (QID) | ORAL | 0 refills | Status: AC | PRN

## 2023-04-28 NOTE — Patient Instructions (Signed)
Please take all new medication as prescribed - the tramadol as needed for pain, and prednisone  Ok to also use the Voltaren gel topical (OTC) as needed for pain as well  You are given the work note today  Please take all new medication as prescribed - the Repatha shots for cholesterol  Please continue all other medications as before, and refills have been done if requested.  Please have the pharmacy call with any other refills you may need.  Please continue your efforts at being more active, low cholesterol diet, and weight control.  Please keep your appointments with your specialists as you may have planned

## 2023-04-28 NOTE — Progress Notes (Signed)
Patient ID: [name], female   DOB: 1971-07-13, 52 y.o.   MRN: [id number]        Chief Complaint: follow up left hand and pain, hld, hyperglycemia, low thyroid       HPI:  [name] is a 52 y.o. 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 Pt denies chest pain, increased sob or doe, wheezing, orthopnea, PND, increased LE swelling, palpitations, dizziness or syncope.  Pt denies polydipsia, polyuria, or new focal neuro s/s.    Pt denies fever, wt loss, night sweats, loss of appetite, or other constitutional symptoms         Wt Readings from Last 3 Encounters:  04/28/23 156 lb (70.8 kg)  03/31/23 159 lb 12.8 oz (72.5 kg)  01/15/23 157 lb 12.8 oz (71.6 kg)   BP Readings from Last 3 Encounters:  04/28/23 122/74  03/31/23 122/82  01/15/23 103/76         Past Medical History:  Diagnosis Date   Abdominal pain, left lower quadrant 03/26/2008   ABDOMINAL PAIN, SUPRAPUBIC 08/04/2007   ALLERGIC RHINITIS 08/04/2007   Allergy    Anemia    ANXIETY 08/04/2007   Arthritis    mild   Cough    instructed to see primary care if worsens-no fever   DEPRESSION 08/04/2007   FATIGUE 08/04/2007   GERD 08/04/2007   Headache(784.0)    Hyperlipidemia 05/20/2014   Insomnia    PONV (postoperative nausea and vomiting)    SINUSITIS- ACUTE-NOS 07/25/2010   SVD (spontaneous vaginal delivery)    x 4   Thyroid disease    Past Surgical History:  Procedure Laterality Date   ABDOMINAL HYSTERECTOMY  08/17/2002   fibroids   COLONOSCOPY     LAPAROSCOPY  03/23/2012   Procedure: LAPAROSCOPY OPERATIVE;  Surgeon: Meriel Pica, MD;  Location: [facility];  Service: Gynecology;  Laterality: N/A;  with CO2 laser ablation of Vin;need microscope,vaginal vault excision of keloid;will want kenolog 40 from pharmacy.   LAPAROTOMY  05/02/2012   Procedure: EXPLORATORY LAPAROTOMY;   Surgeon: Meriel Pica, MD;  Location: [facility];  Service: Gynecology;  Laterality: N/A;   LASER ABLATION  2013   vagina   POLYPECTOMY     RHINOPLASTY     nose   SALPINGOOPHORECTOMY  05/02/2012   Procedure: SALPINGO OOPHERECTOMY;  Surgeon: Meriel Pica, MD;  Location: [facility];  Service: Gynecology;  Laterality: Bilateral;  bilateral   SCAR REVISION  03/23/2012   Procedure: SCAR REVISION;  Surgeon: Meriel Pica, MD;  Location: [facility];  Service: Gynecology;  Laterality: N/A;   SCAR REVISION N/A 08/08/2013   Procedure: SCAR REVISION ABDOMINAL;  Surgeon: Meriel Pica, MD;  Location: [facility];  Service: Gynecology;  Laterality: N/A;   TUBAL LIGATION     WISDOM TOOTH EXTRACTION      reports that she has never smoked. She has never used smokeless tobacco. She reports that she does not drink alcohol and does not use drugs. family history includes Asthma in her mother. Allergies  Allergen Reactions   Latex Shortness Of Breath, Swelling and Rash   Codeine Itching   Crestor [Rosuvastatin]    Lipitor [Atorvastatin] Other (See Comments)    myalgia   Methimazole Hives and Itching   Other Itching and Nausea And Vomiting    Pain medication  given after surgery; pt doesn't remember name.  Most likely opioid-derived.   Soy Allergy Swelling   Current Outpatient Medications on File Prior to Visit  Medication Sig Dispense Refill   Cholecalciferol (VITAMIN D3 PO) Take by mouth.     citalopram (CELEXA) 20 MG tablet Take 1 tablet (20 mg total) by mouth daily. 90 tablet 3   Cyanocobalamin (VITAMIN B 12 PO) Take by mouth.     cyclobenzaprine (FLEXERIL) 5 MG tablet Take 1 tablet (5 mg total) by mouth 3 (three) times daily as needed. 40 tablet 1   diphenhydrAMINE (BENADRYL) 25 MG tablet Take 25 mg by mouth every 6 (six) hours as needed.     estradiol (ESTRACE) 0.1 MG/GM vaginal cream Place 1 Applicatorful vaginally 3 (three) times a week. 42.5 g 12   hydrOXYzine (ATARAX/VISTARIL) 25 MG tablet  Take 1 tablet (25 mg total) by mouth 3 (three) times daily as needed. 100 tablet 3   levothyroxine (SYNTHROID) 112 MCG tablet TAKE 1 TABLET(112 MCG) BY MOUTH DAILY 90 tablet 3   Loratadine 10 MG CAPS Take by mouth.     pantoprazole (PROTONIX) 40 MG tablet Take 1 tablet (40 mg total) by mouth daily. 90 tablet 3   traZODone (DESYREL) 50 MG tablet Take 0.5-1 tablets (25-50 mg total) by mouth at bedtime as needed for sleep. 90 tablet 1   triamcinolone cream (KENALOG) 0.1 % Apply 1 application topically 2 (two) times daily. 160 g 3   No current facility-administered medications on file prior to visit.        ROS:  All others reviewed and negative.  Objective        PE:  BP 122/74 (BP Location: Left Arm, Patient Position: Sitting, Cuff Size: Normal)   Pulse 65   Temp 99.1 F (37.3 C) (Oral)   Ht 5' 7.2" (1.707 m)   Wt 156 lb (70.8 kg)   SpO2 98%   BMI 24.29 kg/m                 Constitutional: Pt appears in NAD               HENT: Head: NCAT.                Right Ear: External ear normal.                 Left Ear: External ear normal.                Eyes: . Pupils are equal, round, and reactive to light. Conjunctivae and EOM are normal               Nose: without d/c or deformity               Neck: Neck supple. Gross normal ROM               Cardiovascular: Normal rate and regular rhythm.                 Pulmonary/Chest: Effort normal and breath sounds without rales or wheezing.                Abd:  Soft, NT, ND, + BS, no organomegaly               Neurological: Pt is alert. At baseline orientation, motor grossly intact               Skin: Skin is warm. No rashes, no other new lesions,  LE edema - none;  left hand index finger dorsal compartment 2+ tender swelling to mid arm, o/w neurovasc intact               Psychiatric: Pt behavior is normal without agitation   Micro: none  Cardiac tracings I have personally interpreted today:  none  Pertinent Radiological findings (summarize):  none   Lab Results  Component Value Date   WBC 7.0 03/31/2023   HGB 13.1 03/31/2023   HCT 40.1 03/31/2023   PLT 183.0 03/31/2023   GLUCOSE 106 (H) 03/31/2023   CHOL 212 (H) 03/31/2023   TRIG 181.0 (H) 03/31/2023   HDL 37.50 (L) 03/31/2023   LDLDIRECT 153.0 12/25/2021   LDLCALC 138 (H) 03/31/2023   ALT 29 03/31/2023   AST 25 03/31/2023   NA 139 03/31/2023   K 4.2 03/31/2023   CL 100 03/31/2023   CREATININE 0.61 03/31/2023   BUN 13 03/31/2023   CO2 32 03/31/2023   TSH 5.63 (H) 03/31/2023   HGBA1C 6.3 03/31/2023   MICROALBUR 3.7 (H) 03/31/2023   Assessment/Plan:  [name] is a 52 y.o. Asian [4] female with  has a past medical history of Abdominal pain, left lower quadrant (03/26/2008), ABDOMINAL PAIN, SUPRAPUBIC (08/04/2007), ALLERGIC RHINITIS (08/04/2007), Allergy, Anemia, ANXIETY (08/04/2007), Arthritis, Cough, DEPRESSION (08/04/2007), FATIGUE (08/04/2007), GERD (08/04/2007), Headache(784.0), Hyperlipidemia (05/20/2014), Insomnia, PONV (postoperative nausea and vomiting), SINUSITIS- ACUTE-NOS (07/25/2010), SVD (spontaneous vaginal delivery), and Thyroid disease.  Hyperglycemia Lab Results  Component Value Date   HGBA1C 6.3 03/31/2023   Stable, pt to continue current medical treatment  - diet, wt control   Hyperlipidemia Lab Results  Component Value Date   LDLCALC 138 (H) 03/31/2023   Uncontrolled,, pt to start statin zocor 40 every day, but consider repatha if unable to tolerate   Hypothyroidism Lab Results  Component Value Date   TSH 5.63 (H) 03/31/2023   Slight elevated tsh but minimal, pt to continue levothyroxine 112 mcg qd   Tenosynovitis of left hand Mod to severe tenosynovitis due to overuse; for tramadol 50 mg prn, prednisone taper, volt gel topical prn, given work note, declines sport med referral for now  Followup: Return if symptoms worsen or fail to improve.  Oliver Barre, MD 05/01/2023 6:27 PM [facility] -  [facility] Medicine

## 2023-04-29 ENCOUNTER — Other Ambulatory Visit: Payer: Self-pay | Admitting: Internal Medicine

## 2023-04-29 NOTE — Telephone Encounter (Signed)
Ok for change to zocor 40 mg qd

## 2023-05-01 ENCOUNTER — Encounter: Payer: Self-pay | Admitting: Internal Medicine

## 2023-05-01 NOTE — Assessment & Plan Note (Addendum)
Lab Results  Component Value Date   TSH 5.63 (H) 03/31/2023   Slight elevated tsh but minimal, pt to continue levothyroxine 112 mcg qd

## 2023-05-01 NOTE — Assessment & Plan Note (Addendum)
Lab Results  Component Value Date   LDLCALC 138 (H) 03/31/2023   Uncontrolled,, pt to start statin zocor 40 every day, but consider repatha if unable to tolerate

## 2023-05-01 NOTE — Assessment & Plan Note (Signed)
Mod to severe tenosynovitis due to overuse; for tramadol 50 mg prn, prednisone taper, volt gel topical prn, given work note, declines sport med referral for now

## 2023-05-01 NOTE — Assessment & Plan Note (Signed)
Lab Results  Component Value Date   HGBA1C 6.3 03/31/2023   Stable, pt to continue current medical treatment  - diet,wt control

## 2024-04-21 ENCOUNTER — Encounter: Payer: Self-pay | Admitting: Internal Medicine

## 2024-04-21 ENCOUNTER — Ambulatory Visit: Payer: Self-pay

## 2024-04-21 ENCOUNTER — Ambulatory Visit: Admitting: Internal Medicine

## 2024-04-21 VITALS — BP 112/74 | HR 74 | Temp 98.2°F | Ht 67.2 in | Wt 170.8 lb

## 2024-04-21 DIAGNOSIS — J019 Acute sinusitis, unspecified: Secondary | ICD-10-CM

## 2024-04-21 DIAGNOSIS — F411 Generalized anxiety disorder: Secondary | ICD-10-CM

## 2024-04-21 DIAGNOSIS — R739 Hyperglycemia, unspecified: Secondary | ICD-10-CM

## 2024-04-21 LAB — POC COVID19 BINAXNOW: SARS Coronavirus 2 Ag: NEGATIVE

## 2024-04-21 LAB — POCT INFLUENZA A/B
Influenza A, POC: NEGATIVE
Influenza B, POC: NEGATIVE

## 2024-04-21 MED ORDER — DOXYCYCLINE HYCLATE 100 MG PO TABS: 100.0000 mg | ORAL_TABLET | Freq: Two times a day (BID) | ORAL | 0 refills | Status: DC

## 2024-04-21 MED ORDER — DOXYCYCLINE HYCLATE 100 MG PO TABS: 100.0000 mg | ORAL_TABLET | Freq: Two times a day (BID) | ORAL | 0 refills | Status: AC

## 2024-04-21 MED ORDER — HYDROCODONE BIT-HOMATROP MBR 5-1.5 MG/5ML PO SOLN: 5.0000 mL | Freq: Four times a day (QID) | ORAL | 0 refills | Status: AC | PRN

## 2024-04-21 MED ORDER — HYDROCODONE BIT-HOMATROP MBR 5-1.5 MG/5ML PO SOLN: 5.0000 mL | Freq: Four times a day (QID) | ORAL | 0 refills | Status: DC | PRN

## 2024-04-21 NOTE — Telephone Encounter (Signed)
 FYI Only or Action Required?: FYI only for provider.  Patient was last seen in primary care on 04/28/2023 by Norleen Lynwood ORN, MD.  Called Nurse Triage reporting Shortness of Breath.  Symptoms began 2 days ago.  Interventions attempted: Rest, hydration, or home remedies.  Symptoms are: unchanged.  Triage Disposition: See HCP Within 4 Hours (Or PCP Triage)  Patient/caregiver understands and will follow disposition?: Yes  Copied from CRM #[id number]. Topic: Clinical - Red Word Triage >> Apr 21, 2024 12:32 PM Rea ORN wrote: Red Word that prompted transfer to Nurse Triage: Pt stated two days ago she started having headache, coughing, body ache, and SOB Reason for Disposition  [1] MILD difficulty breathing (e.g., minimal/no SOB at rest, SOB with walking, pulse < 100) AND [2] NEW-onset or WORSE than normal  Answer Assessment - Initial Assessment Questions 1. RESPIRATORY STATUS: Describe your breathing? (e.g., wheezing, shortness of breath, unable to speak, severe coughing)      Shortness of breath, coughing to where patient couldn't sleep 2. ONSET: When did this breathing problem begin?      Started 2 days ago 3. PATTERN Does the difficult breathing come and go, or has it been constant since it started?      Comes and goes 4. SEVERITY: How bad is your breathing? (e.g., mild, moderate, severe)      mild 5. RECURRENT SYMPTOM: Have you had difficulty breathing before? If Yes, ask: When was the last time? and What happened that time?      no 6. CARDIAC HISTORY: Do you have any history of heart disease? (e.g., heart attack, angina, bypass surgery, angioplasty)      no 7. LUNG HISTORY: Do you have any history of lung disease?  (e.g., pulmonary embolus, asthma, emphysema)     no 8. CAUSE: What do you think is causing the breathing problem?      unsure 9. OTHER SYMPTOMS: Do you have any other symptoms? (e.g., chest pain, cough, dizziness, fever, runny nose)     Runny nose,  chest tightness, body aches 10. O2 SATURATION MONITOR:  Do you use an oxygen saturation monitor (pulse oximeter) at home? If Yes, ask: What is your reading (oxygen level) today? What is your usual oxygen saturation reading? (e.g., 95%)       N/A 12. TRAVEL: Have you traveled out of the country in the last month? (e.g., travel history, exposures)       no  Protocols used: Breathing Difficulty-A-AH

## 2024-04-21 NOTE — Assessment & Plan Note (Signed)
 Mild to mod, for antibx course - doxycycline 100 bid, cough med prn, to f/u any worsening symptoms or concerns

## 2024-04-21 NOTE — Patient Instructions (Addendum)
 Your covid and flu testing is negative  Please take all new medication as prescribed - the antibiotic, and cough medicine  Please continue all other medications as before, and refills have been done if requested.  Please have the pharmacy call with any other refills you may need.  Please keep your appointments with your specialists as you may have planned  You are given the work note  Please make an Appointment to return in 3 months, or sooner if needed, also with Lab Appointment for testing done 3-5 days before at the FIRST FLOOR Lab (so this is for TWO appointments - please see the scheduling desk as you leave)

## 2024-04-21 NOTE — Assessment & Plan Note (Signed)
Overall stable, cont current med tx 

## 2024-04-21 NOTE — Addendum Note (Signed)
 Addended byBETHA LUCETTA CLEATRICE LELON on: 04/21/2024 04:35 PM   Modules accepted: Orders

## 2024-04-21 NOTE — Progress Notes (Addendum)
 Patient ID: [name], female   DOB: [birth year], 53 y.o.   MRN: [id number]        Chief Complaint: follow up sinusitis, hyperglycemia, anxiety       HPI:  [name] is a 53 y.o. female  Here with 2-3 days acute onset fever, facial pain, pressure, headache, general weakness and malaise, and greenish d/c, with mild ST and cough, but pt denies chest pain, wheezing, orthopnea, PND, increased LE swelling, palpitations, dizziness or syncope, though has sob with difficulty breathing as deep. .   Pt denies polydipsia, polyuria, or new focal neuro s/s.    Pt denies fever, wt loss, night sweats, loss of appetite, or other constitutional symptoms  Denies worsening depressive symptoms, suicidal ideation, or panic;       Wt Readings from Last 3 Encounters:  04/21/24 170 lb 12.8 oz (77.5 kg)  04/28/23 156 lb (70.8 kg)  03/31/23 159 lb 12.8 oz (72.5 kg)   BP Readings from Last 3 Encounters:  04/21/24 112/74  04/28/23 122/74  03/31/23 122/82         Past Medical History:  Diagnosis Date   Abdominal pain, left lower quadrant 03/26/2008   ABDOMINAL PAIN, SUPRAPUBIC 08/04/2007   ALLERGIC RHINITIS 08/04/2007   Allergy    Anemia    ANXIETY 08/04/2007   Arthritis    mild   Cough    instructed to see primary care if worsens-no fever   DEPRESSION 08/04/2007   FATIGUE 08/04/2007   GERD 08/04/2007   Headache(784.0)    Hyperlipidemia 05/20/2014   Insomnia    PONV (postoperative nausea and vomiting)    SINUSITIS- ACUTE-NOS 07/25/2010   SVD (spontaneous vaginal delivery)    x 4   Thyroid  disease    Past Surgical History:  Procedure Laterality Date   ABDOMINAL HYSTERECTOMY  08/17/2002   fibroids   COLONOSCOPY     LAPAROSCOPY  03/23/2012   Procedure: LAPAROSCOPY OPERATIVE;  Surgeon: Charlie CHRISTELLA Croak, MD;  Location: [facility];  Service: Gynecology;  Laterality: N/A;  with CO2 laser ablation of Vin;need microscope,vaginal vault excision of keloid;will want kenolog 40 from pharmacy.   LAPAROTOMY   05/02/2012   Procedure: EXPLORATORY LAPAROTOMY;  Surgeon: Charlie CHRISTELLA Croak, MD;  Location: [facility];  Service: Gynecology;  Laterality: N/A;   LASER ABLATION  2013   vagina   POLYPECTOMY     RHINOPLASTY     nose   SALPINGOOPHORECTOMY  05/02/2012   Procedure: SALPINGO OOPHERECTOMY;  Surgeon: Charlie CHRISTELLA Croak, MD;  Location: [facility];  Service: Gynecology;  Laterality: Bilateral;  bilateral   SCAR REVISION  03/23/2012   Procedure: SCAR REVISION;  Surgeon: Charlie CHRISTELLA Croak, MD;  Location: [facility];  Service: Gynecology;  Laterality: N/A;   SCAR REVISION N/A 08/08/2013   Procedure: SCAR REVISION ABDOMINAL;  Surgeon: Charlie CHRISTELLA Croak, MD;  Location: [facility];  Service: Gynecology;  Laterality: N/A;   TUBAL LIGATION     WISDOM TOOTH EXTRACTION      reports that she has never smoked. She has never used smokeless tobacco. She reports that she does not drink alcohol and does not use drugs. family history includes Asthma in her mother. Allergies  Allergen Reactions   Latex Shortness Of Breath, Swelling and Rash   Codeine Itching   Crestor [Rosuvastatin]    Lipitor [Atorvastatin ] Other (See Comments)    myalgia   Methimazole  Hives and Itching   Other Itching and Nausea And Vomiting    Pain medication given  after surgery; pt doesn't remember name.  Most likely opioid-derived.   Soy Allergy (Obsolete) Swelling   Current Outpatient Medications on File Prior to Visit  Medication Sig Dispense Refill   Cholecalciferol (VITAMIN D3 PO) Take by mouth.     citalopram  (CELEXA ) 20 MG tablet Take 1 tablet (20 mg total) by mouth daily. 90 tablet 3   Cyanocobalamin  (VITAMIN B 12 PO) Take by mouth.     cyclobenzaprine  (FLEXERIL ) 5 MG tablet Take 1 tablet (5 mg total) by mouth 3 (three) times daily as needed. 40 tablet 1   diphenhydrAMINE  (BENADRYL ) 25 MG tablet Take 25 mg by mouth every 6 (six) hours as needed.     estradiol  (ESTRACE ) 0.1 MG/GM vaginal cream Place 1 Applicatorful vaginally 3 (three) times a  week. 42.5 g 12   hydrOXYzine  (ATARAX /VISTARIL ) 25 MG tablet Take 1 tablet (25 mg total) by mouth 3 (three) times daily as needed. 100 tablet 3   levothyroxine  (SYNTHROID ) 112 MCG tablet TAKE 1 TABLET(112 MCG) BY MOUTH DAILY 90 tablet 3   Loratadine 10 MG CAPS Take by mouth.     pantoprazole  (PROTONIX ) 40 MG tablet Take 1 tablet (40 mg total) by mouth daily. 90 tablet 3   predniSONE  (DELTASONE ) 10 MG tablet 3 tabs by mouth per day for 3 days,2tabs per day for 3 days,1tab per day for 3 days 18 tablet 0   simvastatin (ZOCOR) 40 MG tablet Take 1 tablet (40 mg total) by mouth at bedtime. 90 tablet 3   traMADol  (ULTRAM ) 50 MG tablet Take 1 tablet (50 mg total) by mouth every 6 (six) hours as needed. 30 tablet 0   traZODone  (DESYREL ) 50 MG tablet Take 0.5-1 tablets (25-50 mg total) by mouth at bedtime as needed for sleep. 90 tablet 1   triamcinolone  cream (KENALOG ) 0.1 % Apply 1 application topically 2 (two) times daily. 160 g 3   No current facility-administered medications on file prior to visit.        ROS:  All others reviewed and negative.  Objective        PE:  BP 112/74   Pulse 74   Temp 98.2 F (36.8 C)   Ht 5' 7.2 (1.707 m)   Wt 170 lb 12.8 oz (77.5 kg)   SpO2 97%   BMI 26.59 kg/m                 Constitutional: Pt appears mild ill               HENT: Head: NCAT.                Right Ear: External ear normal.                 Left Ear: External ear normal. Bilat tm's with mild erythema.  Max sinus areas mild tender.  Pharynx with mild erythema, no exudate               Eyes: . Pupils are equal, round, and reactive to light. Conjunctivae and EOM are normal               Nose: without d/c or deformity               Neck: Neck supple. Gross normal ROM               Cardiovascular: Normal rate and regular rhythm.                 Pulmonary/Chest:  Effort normal and breath sounds without rales or wheezing.                              Neurological: Pt is alert. At baseline  orientation, motor grossly intact               Skin: Skin is warm. No rashes, no other new lesions, LE edema - none               Psychiatric: Pt behavior is normal without agitation   Micro: none  Cardiac tracings I have personally interpreted today:  none  Pertinent Radiological findings (summarize): none   Lab Results  Component Value Date   WBC 7.0 03/31/2023   HGB 13.1 03/31/2023   HCT 40.1 03/31/2023   PLT 183.0 03/31/2023   GLUCOSE 106 (H) 03/31/2023   CHOL 212 (H) 03/31/2023   TRIG 181.0 (H) 03/31/2023   HDL 37.50 (L) 03/31/2023   LDLDIRECT 153.0 12/25/2021   LDLCALC 138 (H) 03/31/2023   ALT 29 03/31/2023   AST 25 03/31/2023   NA 139 03/31/2023   K 4.2 03/31/2023   CL 100 03/31/2023   CREATININE 0.61 03/31/2023   BUN 13 03/31/2023   CO2 32 03/31/2023   TSH 5.63 (H) 03/31/2023   HGBA1C 6.3 03/31/2023   POCT - COVID - neg, Flu - neg  Assessment/Plan:  [name] is a 52 y.o. Asian [4] female with  has a past medical history of Abdominal pain, left lower quadrant (03/26/2008), ABDOMINAL PAIN, SUPRAPUBIC (08/04/2007), ALLERGIC RHINITIS (08/04/2007), Allergy, Anemia, ANXIETY (08/04/2007), Arthritis, Cough, DEPRESSION (08/04/2007), FATIGUE (08/04/2007), GERD (08/04/2007), Headache(784.0), Hyperlipidemia (05/20/2014), Insomnia, PONV (postoperative nausea and vomiting), SINUSITIS- ACUTE-NOS (07/25/2010), SVD (spontaneous vaginal delivery), and Thyroid  disease.  Anxiety state Overall stable, cont current med tx  Hyperglycemia Lab Results  Component Value Date   HGBA1C 6.3 03/31/2023   Stable, pt to continue current medical treatment  - diet ,wt control   Acute sinus infection Mild to mod, for antibx course doxycycline  100 bid, cough med prn,  to f/u any worsening symptoms or concerns  Followup: Return in about 3 months (around 07/21/2024).  Lynwood Rush, MD 04/21/2024 3:04 PM [facility]

## 2024-04-21 NOTE — Assessment & Plan Note (Signed)
Lab Results  Component Value Date   HGBA1C 6.3 03/31/2023   Stable, pt to continue current medical treatment  - diet,wt control

## 2024-04-29 ENCOUNTER — Other Ambulatory Visit: Payer: Self-pay | Admitting: Internal Medicine

## 2024-05-01 ENCOUNTER — Other Ambulatory Visit: Payer: Self-pay | Admitting: Internal Medicine

## 2024-05-01 DIAGNOSIS — E890 Postprocedural hypothyroidism: Secondary | ICD-10-CM

## 2024-05-27 ENCOUNTER — Other Ambulatory Visit: Payer: Self-pay | Admitting: Internal Medicine

## 2024-07-14 ENCOUNTER — Encounter: Payer: Self-pay | Admitting: Obstetrics and Gynecology

## 2025-03-01 ENCOUNTER — Encounter: Admitting: Nurse Practitioner
